# Patient Record
Sex: Female | Born: 1935 | Race: White | Hispanic: No | Smoking: Never smoker
Health system: Southern US, Community
[De-identification: ages and names within clinical notes are randomized; demographics above are authoritative.]

## PROBLEM LIST (undated history)

## (undated) DIAGNOSIS — E785 Hyperlipidemia, unspecified: Secondary | ICD-10-CM

## (undated) DIAGNOSIS — S22080A Wedge compression fracture of T11-T12 vertebra, initial encounter for closed fracture: Secondary | ICD-10-CM

## (undated) DIAGNOSIS — Z9181 History of falling: Secondary | ICD-10-CM

## (undated) DIAGNOSIS — F0390 Unspecified dementia without behavioral disturbance: Secondary | ICD-10-CM

## (undated) DIAGNOSIS — D72820 Lymphocytosis (symptomatic): Secondary | ICD-10-CM

## (undated) DIAGNOSIS — R350 Frequency of micturition: Secondary | ICD-10-CM

## (undated) DIAGNOSIS — L989 Disorder of the skin and subcutaneous tissue, unspecified: Secondary | ICD-10-CM

## (undated) DIAGNOSIS — R413 Other amnesia: Secondary | ICD-10-CM

## (undated) DIAGNOSIS — R430 Anosmia: Principal | ICD-10-CM

## (undated) DIAGNOSIS — G252 Other specified forms of tremor: Secondary | ICD-10-CM

## (undated) DIAGNOSIS — G250 Essential tremor: Secondary | ICD-10-CM

## (undated) HISTORY — DX: Unspecified dementia, unspecified severity, without behavioral disturbance, psychotic disturbance, mood disturbance, and anxiety: F03.90

## (undated) HISTORY — DX: Other amnesia: R41.3

## (undated) HISTORY — DX: Hyperlipidemia, unspecified: E78.5

## (undated) HISTORY — DX: History of falling: Z91.81

## (undated) HISTORY — DX: Disorder of the skin and subcutaneous tissue, unspecified: L98.9

## (undated) HISTORY — DX: Lymphocytosis (symptomatic): D72.820

## (undated) HISTORY — DX: Essential tremor: G25.0

## (undated) HISTORY — DX: Wedge compression fracture of t11-T12 vertebra, initial encounter for closed fracture: S22.080A

## (undated) HISTORY — DX: Anosmia: R43.0

## (undated) HISTORY — PX: TONSILLECTOMY: SHX5217

## (undated) HISTORY — DX: Essential tremor: G25.2

## (undated) HISTORY — DX: Frequency of micturition: R35.0

---

## 1964-01-08 HISTORY — PX: ABDOMINAL HYSTERECTOMY: SHX81

## 2008-11-11 ENCOUNTER — Emergency Department (HOSPITAL_BASED_OUTPATIENT_CLINIC_OR_DEPARTMENT_OTHER): Admission: EM | Admit: 2008-11-11 | Discharge: 2008-11-11 | Payer: Self-pay | Admitting: Emergency Medicine

## 2008-11-11 ENCOUNTER — Ambulatory Visit: Payer: Self-pay | Admitting: Diagnostic Radiology

## 2008-11-12 ENCOUNTER — Inpatient Hospital Stay (HOSPITAL_COMMUNITY): Admission: EM | Admit: 2008-11-12 | Discharge: 2008-11-14 | Payer: Self-pay | Admitting: Orthopedic Surgery

## 2008-11-27 ENCOUNTER — Emergency Department (HOSPITAL_BASED_OUTPATIENT_CLINIC_OR_DEPARTMENT_OTHER): Admission: EM | Admit: 2008-11-27 | Discharge: 2008-11-27 | Payer: Self-pay | Admitting: Emergency Medicine

## 2008-11-27 ENCOUNTER — Ambulatory Visit: Payer: Self-pay | Admitting: Diagnostic Radiology

## 2009-01-10 ENCOUNTER — Encounter: Admission: RE | Admit: 2009-01-10 | Discharge: 2009-03-23 | Payer: Self-pay | Admitting: Orthopedic Surgery

## 2010-04-11 LAB — COMPREHENSIVE METABOLIC PANEL
Albumin: 3.1 g/dL — ABNORMAL LOW (ref 3.5–5.2)
Albumin: 3.1 g/dL — ABNORMAL LOW (ref 3.5–5.2)
Alkaline Phosphatase: 72 U/L (ref 39–117)
BUN: 15 mg/dL (ref 6–23)
BUN: 17 mg/dL (ref 6–23)
BUN: 22 mg/dL (ref 6–23)
CO2: 27 mEq/L (ref 19–32)
CO2: 27 mEq/L (ref 19–32)
Calcium: 9.5 mg/dL (ref 8.4–10.5)
Calcium: 9.5 mg/dL (ref 8.4–10.5)
Chloride: 105 mEq/L (ref 96–112)
Creatinine, Ser: 0.95 mg/dL (ref 0.4–1.2)
GFR calc non Af Amer: 56 mL/min — ABNORMAL LOW (ref >60)
GFR calc non Af Amer: 57 mL/min — ABNORMAL LOW (ref >60)
Glucose, Bld: 117 mg/dL — ABNORMAL HIGH (ref 70–99)
Glucose, Bld: 89 mg/dL (ref 70–99)
Glucose, Bld: 99 mg/dL (ref 70–99)
Potassium: 3.8 mEq/L (ref 3.5–5.1)
Potassium: 3.8 mEq/L (ref 3.5–5.1)
Sodium: 139 mEq/L (ref 135–145)
Sodium: 140 mEq/L (ref 135–145)
Total Bilirubin: 0.6 mg/dL (ref 0.3–1.2)
Total Protein: 5.4 g/dL — ABNORMAL LOW (ref 6.0–8.3)
Total Protein: 5.5 g/dL — ABNORMAL LOW (ref 6.0–8.3)

## 2010-04-11 LAB — CBC
HCT: 35.5 % — ABNORMAL LOW (ref 36.0–46.0)
HCT: 35.5 % — ABNORMAL LOW (ref 36.0–46.0)
HCT: 35.7 % — ABNORMAL LOW (ref 36.0–46.0)
Hemoglobin: 11.9 g/dL — ABNORMAL LOW (ref 12.0–15.0)
Hemoglobin: 12 g/dL (ref 12.0–15.0)
Hemoglobin: 12.2 g/dL (ref 12.0–15.0)
MCHC: 33.5 g/dL (ref 30.0–36.0)
MCHC: 33.9 g/dL (ref 30.0–36.0)
MCV: 93.5 fL (ref 78.0–100.0)
MCV: 94 fL (ref 78.0–100.0)
Platelets: 152 10*3/uL (ref 150–400)
Platelets: 157 10*3/uL (ref 150–400)
RBC: 3.8 MIL/uL — ABNORMAL LOW (ref 3.87–5.11)
RDW: 12.2 % (ref 11.5–15.5)
RDW: 12.9 % (ref 11.5–15.5)
WBC: 11.4 10*3/uL — ABNORMAL HIGH (ref 4.0–10.5)

## 2010-04-11 LAB — DIFFERENTIAL
Basophils Absolute: 0.1 10*3/uL (ref 0.0–0.1)
Basophils Relative: 1 % (ref 0–1)
Lymphocytes Relative: 30 % (ref 12–46)
Lymphs Abs: 4.2 10*3/uL — ABNORMAL HIGH (ref 0.7–4.0)
Lymphs Abs: 4.9 10*3/uL — ABNORMAL HIGH (ref 0.7–4.0)
Monocytes Absolute: 0.8 10*3/uL (ref 0.1–1.0)
Monocytes Absolute: 0.8 10*3/uL (ref 0.1–1.0)
Monocytes Relative: 7 % (ref 3–12)
Monocytes Relative: 9 % (ref 3–12)
Neutro Abs: 5.2 10*3/uL (ref 1.7–7.7)
Neutro Abs: 5.7 10*3/uL (ref 1.7–7.7)
Neutro Abs: 8.5 10*3/uL — ABNORMAL HIGH (ref 1.7–7.7)
Neutrophils Relative %: 47 % (ref 43–77)
Neutrophils Relative %: 60 % (ref 43–77)

## 2010-04-11 LAB — URINALYSIS, ROUTINE W REFLEX MICROSCOPIC
Glucose, UA: NEGATIVE mg/dL
Hgb urine dipstick: NEGATIVE
Protein, ur: NEGATIVE mg/dL
Specific Gravity, Urine: 1.019 (ref 1.005–1.030)

## 2010-04-11 LAB — PROTIME-INR
INR: 0.96 (ref 0.00–1.49)
INR: 1.02 (ref 0.00–1.49)
Prothrombin Time: 12.7 seconds (ref 11.6–15.2)
Prothrombin Time: 13.6 seconds (ref 11.6–15.2)

## 2010-04-11 LAB — URINE MICROSCOPIC-ADD ON

## 2011-01-08 HISTORY — PX: CATARACT EXTRACTION: SUR2

## 2011-01-08 HISTORY — PX: OTHER SURGICAL HISTORY: SHX169

## 2011-01-28 DIAGNOSIS — H43399 Other vitreous opacities, unspecified eye: Secondary | ICD-10-CM | POA: Diagnosis not present

## 2011-01-28 DIAGNOSIS — H353190 Nonexudative age-related macular degeneration, unspecified eye, stage unspecified: Secondary | ICD-10-CM | POA: Diagnosis not present

## 2011-01-28 DIAGNOSIS — H35729 Serous detachment of retinal pigment epithelium, unspecified eye: Secondary | ICD-10-CM | POA: Diagnosis not present

## 2011-01-28 DIAGNOSIS — H04129 Dry eye syndrome of unspecified lacrimal gland: Secondary | ICD-10-CM | POA: Diagnosis not present

## 2011-06-06 DIAGNOSIS — C44319 Basal cell carcinoma of skin of other parts of face: Secondary | ICD-10-CM | POA: Diagnosis not present

## 2011-06-06 DIAGNOSIS — D485 Neoplasm of uncertain behavior of skin: Secondary | ICD-10-CM | POA: Diagnosis not present

## 2011-08-06 DIAGNOSIS — C44319 Basal cell carcinoma of skin of other parts of face: Secondary | ICD-10-CM | POA: Diagnosis not present

## 2011-10-03 DIAGNOSIS — Z23 Encounter for immunization: Secondary | ICD-10-CM | POA: Diagnosis not present

## 2011-10-21 DIAGNOSIS — E785 Hyperlipidemia, unspecified: Secondary | ICD-10-CM | POA: Diagnosis not present

## 2011-10-21 DIAGNOSIS — R42 Dizziness and giddiness: Secondary | ICD-10-CM | POA: Diagnosis not present

## 2012-02-14 DIAGNOSIS — Z961 Presence of intraocular lens: Secondary | ICD-10-CM | POA: Diagnosis not present

## 2012-02-14 DIAGNOSIS — H353190 Nonexudative age-related macular degeneration, unspecified eye, stage unspecified: Secondary | ICD-10-CM | POA: Diagnosis not present

## 2012-02-14 DIAGNOSIS — H43399 Other vitreous opacities, unspecified eye: Secondary | ICD-10-CM | POA: Diagnosis not present

## 2012-10-21 DIAGNOSIS — Z23 Encounter for immunization: Secondary | ICD-10-CM | POA: Diagnosis not present

## 2012-11-06 DIAGNOSIS — D485 Neoplasm of uncertain behavior of skin: Secondary | ICD-10-CM | POA: Diagnosis not present

## 2012-11-06 DIAGNOSIS — Z85828 Personal history of other malignant neoplasm of skin: Secondary | ICD-10-CM | POA: Diagnosis not present

## 2012-11-06 DIAGNOSIS — C44611 Basal cell carcinoma of skin of unspecified upper limb, including shoulder: Secondary | ICD-10-CM | POA: Diagnosis not present

## 2012-11-16 DIAGNOSIS — Z85828 Personal history of other malignant neoplasm of skin: Secondary | ICD-10-CM | POA: Diagnosis not present

## 2012-11-16 DIAGNOSIS — C44611 Basal cell carcinoma of skin of unspecified upper limb, including shoulder: Secondary | ICD-10-CM | POA: Diagnosis not present

## 2013-04-01 ENCOUNTER — Non-Acute Institutional Stay: Payer: Medicare Other | Admitting: Nurse Practitioner

## 2013-04-01 VITALS — BP 124/60 | HR 76 | Temp 98.6°F | Ht 63.0 in | Wt 143.0 lb

## 2013-04-01 DIAGNOSIS — R259 Unspecified abnormal involuntary movements: Secondary | ICD-10-CM | POA: Diagnosis not present

## 2013-04-01 DIAGNOSIS — H6123 Impacted cerumen, bilateral: Secondary | ICD-10-CM

## 2013-04-01 DIAGNOSIS — R251 Tremor, unspecified: Secondary | ICD-10-CM

## 2013-04-01 DIAGNOSIS — H6120 Impacted cerumen, unspecified ear: Secondary | ICD-10-CM

## 2013-04-01 DIAGNOSIS — R350 Frequency of micturition: Secondary | ICD-10-CM | POA: Diagnosis not present

## 2013-04-01 DIAGNOSIS — D1800 Hemangioma unspecified site: Secondary | ICD-10-CM

## 2013-04-01 DIAGNOSIS — R413 Other amnesia: Secondary | ICD-10-CM | POA: Diagnosis not present

## 2013-04-01 DIAGNOSIS — E785 Hyperlipidemia, unspecified: Secondary | ICD-10-CM

## 2013-04-03 DIAGNOSIS — R350 Frequency of micturition: Secondary | ICD-10-CM

## 2013-04-03 DIAGNOSIS — R413 Other amnesia: Secondary | ICD-10-CM

## 2013-04-03 DIAGNOSIS — H6123 Impacted cerumen, bilateral: Secondary | ICD-10-CM | POA: Insufficient documentation

## 2013-04-03 DIAGNOSIS — E785 Hyperlipidemia, unspecified: Secondary | ICD-10-CM | POA: Insufficient documentation

## 2013-04-03 DIAGNOSIS — R251 Tremor, unspecified: Secondary | ICD-10-CM | POA: Insufficient documentation

## 2013-04-03 DIAGNOSIS — D1800 Hemangioma unspecified site: Secondary | ICD-10-CM | POA: Insufficient documentation

## 2013-04-03 HISTORY — DX: Frequency of micturition: R35.0

## 2013-04-03 HISTORY — DX: Other amnesia: R41.3

## 2013-04-03 NOTE — Assessment & Plan Note (Signed)
OTC debrox

## 2013-04-03 NOTE — Assessment & Plan Note (Signed)
Resting tremor in hands R>L not disabling.

## 2013-04-03 NOTE — Assessment & Plan Note (Signed)
Venous lake like skin lesion posterior right flank. Observe.

## 2013-04-03 NOTE — Progress Notes (Signed)
Patient ID: Debra [name], female   DOB: [birth year], 80 y.o.   MRN: [id number]   Code Status: Full Code.   No Known Allergies  Chief Complaint  Patient presents with  . Medical Managment of Chronic Issues    New Patient switching primary .  With daughter [name]    HPI: Patient is a 80 y.o. female seen in the clinic at [facility] today for new patient establishment and other chronic medical conditions.  Problem List Items Addressed This Visit   Tremor - Primary     Resting tremor in hands R>L not disabling.     Urinary frequency     Nocturnal, 4-5x/night.     Memory deficit     Memory lapses, will obtain MMSE. Check CBC, CMP, TSH, B12    Impacted cerumen of both ears     OTC debrox     Angioma, venous     Venous lake like skin lesion posterior right flank. Observe.     Relevant Medications      atorvastatin (LIPITOR) 40 MG tablet   Other and unspecified hyperlipidemia     Taking Atorvastatin 40mg  daily, update lipid panel.        Review of Systems:  Review of Systems  Constitutional: Negative for fever, chills, weight loss, malaise/fatigue and diaphoresis.  HENT: Positive for hearing loss. Negative for congestion, ear discharge, ear pain, nosebleeds, sore throat and tinnitus.   Eyes: Negative for blurred vision, double vision, photophobia, pain, discharge and redness.  Respiratory: Negative for cough, hemoptysis, sputum production, shortness of breath, wheezing and stridor.   Cardiovascular: Negative for chest pain, palpitations, orthopnea, claudication, leg swelling and PND.  Gastrointestinal: Negative for heartburn, nausea, vomiting, abdominal pain, diarrhea, constipation, blood in stool and melena.  Genitourinary: Positive for frequency. Negative for dysuria, urgency, hematuria and flank pain.       4-5x/night  Musculoskeletal: Negative for back pain, falls, joint pain, myalgias and neck pain.  Skin: Negative for itching and rash.       A small venous lake  angioma like skin lesion @ the posterior flank.   Neurological: Positive for tremors. Negative for dizziness, tingling, sensory change, speech change, focal weakness, seizures, loss of consciousness, weakness and headaches.       Resting tremor in hands R>L  Endo/Heme/Allergies: Negative for environmental allergies and polydipsia. Does not bruise/bleed easily.  Psychiatric/Behavioral: Negative for depression, suicidal ideas, hallucinations, memory loss and substance abuse. The patient is not nervous/anxious and does not have insomnia.      Past Medical History  Diagnosis Date  . Other and unspecified hyperlipidemia   . Essential and other specified forms of tremor   . Senile dementia, uncomplicated    Past Surgical History  Procedure Laterality Date  . Abdominal hysterectomy  1966  . Tonsillectomy     Social History:   reports that she has never smoked. She has never used smokeless tobacco. She reports that she does not drink alcohol or use illicit drugs.  History reviewed. No pertinent family history.  Medications: Patient's Medications  New Prescriptions   No medications on file  Previous Medications   ACETAMINOPHEN (TYLENOL) 325 MG TABLET    Take 650 mg by mouth. Take one as needed for headache or pain   ATORVASTATIN (LIPITOR) 40 MG TABLET    Take 40 mg by mouth daily.  Modified Medications   No medications on file  Discontinued Medications   No medications on file     Physical Exam:  Physical Exam  Constitutional: She is oriented to person, place, and time. She appears well-developed and well-nourished. No distress.  HENT:  Head: Normocephalic and atraumatic.  Right Ear: External ear normal.  Left Ear: External ear normal.  Nose: Nose normal.  Mouth/Throat: Oropharynx is clear and moist. No oropharyngeal exudate.  Eyes: Conjunctivae and EOM are normal. Pupils are equal, round, and reactive to light. Right eye exhibits no discharge. Left eye exhibits no discharge. No  scleral icterus.  Neck: Normal range of motion. Neck supple. No JVD present. No tracheal deviation present. No thyromegaly present.  Cardiovascular: Normal rate, regular rhythm, normal heart sounds and intact distal pulses.  Exam reveals no gallop.   No murmur heard. Pulmonary/Chest: Effort normal and breath sounds normal. No stridor. No respiratory distress. She has no wheezes. She has no rales.  Abdominal: Soft. Bowel sounds are normal. She exhibits no distension. There is no tenderness. There is no rebound and no guarding.  Genitourinary: Guaiac negative stool.  S/p hysterectomy.   Musculoskeletal: Normal range of motion. She exhibits no edema and no tenderness.  Lymphadenopathy:    She has no cervical adenopathy.  Neurological: She is alert and oriented to person, place, and time. She has normal reflexes. She displays normal reflexes. No cranial nerve deficit. Coordination normal.  Skin: No rash noted. She is not diaphoretic. No erythema. No pallor.  A small venous lake angioma like skin lesion @ the posterior flank.    Psychiatric: She has a normal mood and affect. Her behavior is normal. Judgment and thought content normal. Her mood appears not anxious. Cognition and memory are impaired. She does not exhibit a depressed mood. She exhibits abnormal recent memory.  Memory lapses     Filed Vitals:   04/01/13 1604  BP: 124/60  Pulse: 76  Temp: 98.6 F (37 C)  TempSrc: Oral  Height: 5\' 3"  (1.6 m)  Weight: 143 lb (64.864 kg)      Labs reviewed: Basic Metabolic Panel: No results found for this basename: NA, K, CL, CO2, GLUCOSE, BUN, CREATININE, CALCIUM, MG, PHOS, TSH,  in the last 8760 hours Liver Function Tests: No results found for this basename: AST, ALT, ALKPHOS, BILITOT, PROT, ALBUMIN,  in the last 8760 hours No results found for this basename: LIPASE, AMYLASE,  in the last 8760 hours No results found for this basename: AMMONIA,  in the last 8760 hours CBC: No results  found for this basename: WBC, NEUTROABS, HGB, HCT, MCV, PLT,  in the last 8760 hours Lipid Panel: No results found for this basename: CHOL, HDL, LDLCALC, TRIG, CHOLHDL, LDLDIRECT,  in the last 8760 hours Anemia Panel: No results found for this basename: FOLATE, IRON, VITAMINB12,  in the last 8760 hours  Past Procedures:  NA  Assessment/Plan Tremor Resting tremor in hands R>L not disabling.   Urinary frequency Nocturnal, 4-5x/night.   Memory deficit Memory lapses, will obtain MMSE. Check CBC, CMP, TSH, B12  Impacted cerumen of both ears OTC debrox   Angioma, venous Venous lake like skin lesion posterior right flank. Observe.   Other and unspecified hyperlipidemia Taking Atorvastatin 40mg  daily, update lipid panel.     Family/ Staff Communication: observe the patient.   Goals of Care: IL  Labs/tests ordered: CBC, CMP, TSH, Lipid panel, Vit B12.

## 2013-04-03 NOTE — Assessment & Plan Note (Signed)
Taking Atorvastatin 40mg  daily, update lipid panel.

## 2013-04-03 NOTE — Assessment & Plan Note (Signed)
Nocturnal, 4-5x/night.

## 2013-04-03 NOTE — Assessment & Plan Note (Addendum)
Memory lapses, will obtain MMSE. Check CBC, CMP, TSH, B12

## 2013-04-12 DIAGNOSIS — H04129 Dry eye syndrome of unspecified lacrimal gland: Secondary | ICD-10-CM | POA: Diagnosis not present

## 2013-04-12 DIAGNOSIS — H43399 Other vitreous opacities, unspecified eye: Secondary | ICD-10-CM | POA: Diagnosis not present

## 2013-04-12 DIAGNOSIS — H524 Presbyopia: Secondary | ICD-10-CM | POA: Diagnosis not present

## 2013-04-12 DIAGNOSIS — H353190 Nonexudative age-related macular degeneration, unspecified eye, stage unspecified: Secondary | ICD-10-CM | POA: Diagnosis not present

## 2013-07-29 ENCOUNTER — Encounter: Payer: Self-pay | Admitting: Internal Medicine

## 2013-07-29 ENCOUNTER — Non-Acute Institutional Stay: Payer: Medicare Other | Admitting: Internal Medicine

## 2013-07-29 VITALS — BP 142/62 | HR 72 | Wt 142.0 lb

## 2013-07-29 DIAGNOSIS — R259 Unspecified abnormal involuntary movements: Secondary | ICD-10-CM

## 2013-07-29 DIAGNOSIS — R229 Localized swelling, mass and lump, unspecified: Secondary | ICD-10-CM | POA: Diagnosis not present

## 2013-07-29 DIAGNOSIS — H6120 Impacted cerumen, unspecified ear: Secondary | ICD-10-CM

## 2013-07-29 DIAGNOSIS — R439 Unspecified disturbances of smell and taste: Secondary | ICD-10-CM | POA: Diagnosis not present

## 2013-07-29 DIAGNOSIS — R251 Tremor, unspecified: Secondary | ICD-10-CM

## 2013-07-29 DIAGNOSIS — R413 Other amnesia: Secondary | ICD-10-CM | POA: Diagnosis not present

## 2013-07-29 DIAGNOSIS — R430 Anosmia: Secondary | ICD-10-CM

## 2013-07-29 DIAGNOSIS — L989 Disorder of the skin and subcutaneous tissue, unspecified: Secondary | ICD-10-CM | POA: Insufficient documentation

## 2013-07-29 DIAGNOSIS — H6123 Impacted cerumen, bilateral: Secondary | ICD-10-CM

## 2013-07-29 HISTORY — DX: Disorder of the skin and subcutaneous tissue, unspecified: L98.9

## 2013-07-29 HISTORY — DX: Anosmia: R43.0

## 2013-07-29 NOTE — Progress Notes (Signed)
Patient ID: Debra [name], female   DOB: 10-14-1921, 80 y.o.   MRN: [id number]    Location:  [place] [facility] Service: Clinic (12)  Extended Emergency Contact Information Primary Emergency Contact: Oakley,[name] Address: [address]          [place] [address] [address] [place] Phone: [phone] Mobile Phone: [phone] Relation: Daughter   Chief Complaint  Patient presents with  . Medical Management of Chronic Issues    memory, tremor, urinary frequency. New patient follow-up, saw [facility] 04/01/13. Here with daughter Debra [name]    HPI:   Nodular lesion on surface of skin: right flank  Impacted cerumen of both ears: needs to be cleansed  Memory deficit: present 1 year or more. She is not particularly bothered by it.  Tremor: present > 5 years. Right side greater than left. 6 cps.     Past Medical History  Diagnosis Date  . Other and unspecified hyperlipidemia   . Essential and other specified forms of tremor   . Senile dementia, uncomplicated   . 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 Memory deficit 04/03/2013  . Nodular lesion on surface of skin 07/29/2013    Right flank. Black. About 12m diameter. Nodular.   . Urinary frequency 04/03/2013    Nocturnal, 4-5x/night.       Past Surgical History  Procedure Laterality Date  . Abdominal hysterectomy  1966  . Tonsillectomy    . Cataract extraction Right 2013    Dr. HHerbert Deaner . Excise skin cancer left upper arm Left 2013    Dr. KJari Pigg   History   Social History  . Marital Status: Married    Spouse Name: N/A    Number of Children: N/A  . Years of Education: N/A   Occupational History  . Not on file.   Social History Main Topics  . Smoking status: Never Smoker   . Smokeless tobacco: Never Used  . Alcohol Use: No  . Drug Use: No  . Sexual Activity: Not on file   Other Topics Concern  . Not on file   Social History Narrative   [facility]   Married   Living Will   Exercise: walks daily , walks with cane   Does not drink any alcohol    Never smoked   Caffeine 2 drinks daily      reports that she has never smoked. She has never used smokeless tobacco. She reports that she does not drink alcohol or use illicit drugs.  Immunization History  Administered Date(s) Administered  . DTaP 10/18/2010  . Influenza-Unspecified 08/25/2012  . Pneumococcal Polysaccharide-23 10/08/2000    No Known Allergies  Medications: Patient's Medications  New Prescriptions   No medications on file  Previous Medications   ACETAMINOPHEN (TYLENOL) 325 MG TABLET    Take 650 mg by mouth. Take one as needed for headache or pain   ATORVASTATIN (LIPITOR) 40 MG TABLET    Take 40 mg by mouth daily.  Modified Medications   No medications on file  Discontinued Medications   No medications on file     Review of Systems  Constitutional: Negative for fever, diaphoresis, activity change, fatigue and unexpected weight change.  HENT: Positive for hearing loss. Negative for ear pain, sore throat, trouble swallowing and voice change.   Eyes: Negative.   Respiratory: Negative for cough, choking, shortness of breath and wheezing.   Cardiovascular: Negative for chest pain, palpitations and leg swelling.  Gastrointestinal: Negative for nausea, abdominal pain, diarrhea and abdominal distention.  Endocrine: Negative.   Genitourinary: Negative for dysuria, urgency, frequency, flank pain and vaginal pain.  Musculoskeletal: Positive for gait problem. Negative for arthralgias, back pain, joint swelling, myalgias and neck pain.  Skin:       Recurrent skin cancer left upper arm. Black nodular lesion right flank.  Allergic/Immunologic: Negative.   Neurological: Positive for tremors and numbness (at toes). Negative for dizziness, seizures and weakness.  Hematological: Negative.   Psychiatric/Behavioral: Negative.     There were no vitals filed for this visit. There is no  weight on file to calculate BMI.  Physical Exam  Constitutional: She is oriented to person, place, and time. She appears well-developed and well-nourished. No distress.  HENT:  Head: Normocephalic and atraumatic.  Right Ear: External ear normal.  Left Ear: External ear normal.  Nose: Nose normal.  Mouth/Throat: Oropharynx is clear and moist. No oropharyngeal exudate.  Bilateral cerumen impaction removed with lavage and curette. Decreased hearing.  Eyes: Conjunctivae and EOM are normal. Pupils are equal, round, and reactive to light. Right eye exhibits no discharge. Left eye exhibits no discharge. No scleral icterus.  Neck: Normal range of motion. Neck supple. No JVD present. No tracheal deviation present. No thyromegaly present.  Cardiovascular: Normal rate, regular rhythm, normal heart sounds and intact distal pulses.  Exam reveals no gallop.   No murmur heard. Pulmonary/Chest: Effort normal and breath sounds normal. No stridor. No respiratory distress. She has no wheezes. She has no rales.  Abdominal: Soft. Bowel sounds are normal. She exhibits no distension and no mass. There is no tenderness. There is no rebound and no guarding.  Genitourinary: Guaiac negative stool.  S/p hysterectomy.   Musculoskeletal: Normal range of motion. She exhibits no edema and no tenderness.  Unstable gait. Using cane.  Lymphadenopathy:    She has no cervical adenopathy.  Neurological: She is alert and oriented to person, place, and time. She has normal reflexes. No cranial nerve deficit. Coordination normal.  Able to feel vibration at ankles, but not on toes.  Skin: No rash noted. She is not diaphoretic. No erythema. No pallor.  55m black nodule right flank. Non-tender.   Psychiatric: She has a normal mood and affect. Her behavior is normal. Judgment and thought content normal. Her mood appears not anxious. Cognition and memory are impaired. She does not exhibit a depressed mood. She exhibits abnormal  recent memory.  Memory lapses      Labs reviewed: No visits with results within 3 Month(s) from this visit. Latest known visit with results is:  Admission on 11/12/2008, Discharged on 11/14/2008  Component Date Value Ref Range Status  . WBC 11/12/2008 14.1* 4.0 - 10.5 K/uL Final  . RBC 11/12/2008 3.79* 3.87 - 5.11 MIL/uL Final  . Hemoglobin 11/12/2008 12.0  12.0 - 15.0 g/dL Final  . HCT 11/12/2008 35.5* 36.0 - 46.0 % Final  . MCV 11/12/2008 93.5  78.0 - 100.0 fL Final  . MCHC 11/12/2008 33.9  30.0 - 36.0 g/dL Final  . RDW 11/12/2008 12.2  11.5 - 15.5 % Final  . Platelets 11/12/2008 151  150 - 400 K/uL Final  . Sodium 11/12/2008 139  135 - 145 mEq/L Final  . Potassium 11/12/2008 3.8  3.5 - 5.1 mEq/L Final  . Chloride 11/12/2008 106  96 - 112 mEq/L Final  . CO2 11/12/2008 27  19 - 32 mEq/L Final  . Glucose, Bld 11/12/2008 117* 70 - 99 mg/dL Final  .  BUN 11/12/2008 22  6 - 23 mg/dL Final  . Creatinine, Ser 11/12/2008 0.95  0.4 - 1.2 mg/dL Final  . Calcium 11/12/2008 9.5  8.4 - 10.5 mg/dL Final  . Total Protein 11/12/2008 5.4* 6.0 - 8.3 g/dL Final  . Albumin 11/12/2008 3.3* 3.5 - 5.2 g/dL Final  . AST 11/12/2008 27  0 - 37 U/L Final  . ALT 11/12/2008 24  0 - 35 U/L Final  . Alkaline Phosphatase 11/12/2008 60  39 - 117 U/L Final  . Total Bilirubin 11/12/2008 0.6  0.3 - 1.2 mg/dL Final  . GFR calc non Af Amer 11/12/2008 56* >60 mL/min Final  . GFR calc Af Amer 11/12/2008   >60 mL/min Final                   Value:>60                                The eGFR has been calculated                         using the MDRD equation.                         This calculation has not been                         validated in all clinical                         situations.                         eGFR's persistently                         <60 mL/min signify                         possible Chronic Kidney Disease.  Marland Kitchen Neutrophils Relative % 11/12/2008 60  43 - 77 % Final  . Neutro Abs  11/12/2008 8.5* 1.7 - 7.7 K/uL Final  . Lymphocytes Relative 11/12/2008 30  12 - 46 % Final  . Lymphs Abs 11/12/2008 4.2* 0.7 - 4.0 K/uL Final  . Monocytes Relative 11/12/2008 9  3 - 12 % Final  . Monocytes Absolute 11/12/2008 1.2* 0.1 - 1.0 K/uL Final  . Eosinophils Relative 11/12/2008 1  0 - 5 % Final  . Eosinophils Absolute 11/12/2008 0.1  0.0 - 0.7 K/uL Final  . Basophils Relative 11/12/2008 0  0 - 1 % Final  . Basophils Absolute 11/12/2008 0.1  0.0 - 0.1 K/uL Final  . Color, Urine 11/12/2008 YELLOW  YELLOW Final  . APPearance 11/12/2008 CLEAR  CLEAR Final  . Specific Gravity, Urine 11/12/2008 1.019  1.005 - 1.030 Final  . pH 11/12/2008 5.0  5.0 - 8.0 Final  . Glucose, UA 11/12/2008 NEGATIVE  NEGATIVE mg/dL Final  . Hgb urine dipstick 11/12/2008 NEGATIVE  NEGATIVE Final  . Bilirubin Urine 11/12/2008 NEGATIVE  NEGATIVE Final  . Ketones, ur 11/12/2008 NEGATIVE  NEGATIVE mg/dL Final  . Protein, ur 11/12/2008 NEGATIVE  NEGATIVE mg/dL Final  . Urobilinogen, UA 11/12/2008 0.2  0.0 - 1.0 mg/dL Final  . Nitrite 11/12/2008 NEGATIVE  NEGATIVE Final  . Leukocytes, UA  11/12/2008 MODERATE* NEGATIVE Final  . Squamous Epithelial / LPF 11/12/2008 RARE  RARE Final  . WBC, UA 11/12/2008 11-20  <3 WBC/hpf Final  . Bacteria, UA 11/12/2008 RARE  RARE Final  . Prothrombin Time 11/12/2008 12.7  11.6 - 15.2 seconds Final  . INR 11/12/2008 0.96  0.00 - 1.49 Final  . WBC 11/13/2008 11.1* 4.0 - 10.5 K/uL Final  . RBC 11/13/2008 3.74* 3.87 - 5.11 MIL/uL Final  . Hemoglobin 11/13/2008 11.9* 12.0 - 15.0 g/dL Final  . HCT 11/13/2008 35.5* 36.0 - 46.0 % Final  . MCV 11/13/2008 94.8  78.0 - 100.0 fL Final  . MCHC 11/13/2008 33.5  30.0 - 36.0 g/dL Final  . RDW 11/13/2008 12.9  11.5 - 15.5 % Final  . Platelets 11/13/2008 152  150 - 400 K/uL Final  . Sodium 11/13/2008 140  135 - 145 mEq/L Final  . Potassium 11/13/2008 3.8  3.5 - 5.1 mEq/L Final  . Chloride 11/13/2008 108  96 - 112 mEq/L Final  . CO2 11/13/2008  26  19 - 32 mEq/L Final  . Glucose, Bld 11/13/2008 89  70 - 99 mg/dL Final  . BUN 11/13/2008 17  6 - 23 mg/dL Final  . Creatinine, Ser 11/13/2008 0.81  0.4 - 1.2 mg/dL Final  . Calcium 11/13/2008 9.5  8.4 - 10.5 mg/dL Final  . Total Protein 11/13/2008 5.5* 6.0 - 8.3 g/dL Final  . Albumin 11/13/2008 3.1* 3.5 - 5.2 g/dL Final  . AST 11/13/2008 24  0 - 37 U/L Final  . ALT 11/13/2008 20  0 - 35 U/L Final  . Alkaline Phosphatase 11/13/2008 68  39 - 117 U/L Final  . Total Bilirubin 11/13/2008 0.9  0.3 - 1.2 mg/dL Final  . GFR calc non Af Amer 11/13/2008 >60  >60 mL/min Final  . GFR calc Af Amer 11/13/2008   >60 mL/min Final                   Value:>60                                The eGFR has been calculated                         using the MDRD equation.                         This calculation has not been                         validated in all clinical                         situations.                         eGFR's persistently                         <60 mL/min signify                         possible Chronic Kidney Disease.  Marland Kitchen Neutrophils Relative % 11/13/2008 47  43 - 77 % Final  . Neutro Abs 11/13/2008 5.2  1.7 - 7.7 K/uL Final  . Lymphocytes Relative 11/13/2008 45  12 - 46 % Final  . Lymphs Abs 11/13/2008 4.9* 0.7 - 4.0 K/uL Final  . Monocytes Relative 11/13/2008 7  3 - 12 % Final  . Monocytes Absolute 11/13/2008 0.8  0.1 - 1.0 K/uL Final  . Eosinophils Relative 11/13/2008 1  0 - 5 % Final  . Eosinophils Absolute 11/13/2008 0.1  0.0 - 0.7 K/uL Final  . Basophils Relative 11/13/2008 0  0 - 1 % Final  . Basophils Absolute 11/13/2008 0.0  0.0 - 0.1 K/uL Final  . Prothrombin Time 11/13/2008 13.4  11.6 - 15.2 seconds Final  . INR 11/13/2008 1.02  0.00 - 1.49 Final  . WBC 11/14/2008 11.4* 4.0 - 10.5 K/uL Final  . RBC 11/14/2008 3.80* 3.87 - 5.11 MIL/uL Final  . Hemoglobin 11/14/2008 12.2  12.0 - 15.0 g/dL Final  . HCT 11/14/2008 35.7* 36.0 - 46.0 % Final  . MCV 11/14/2008  94.0  78.0 - 100.0 fL Final  . MCHC 11/14/2008 34.0  30.0 - 36.0 g/dL Final  . RDW 11/14/2008 13.1  11.5 - 15.5 % Final  . Platelets 11/14/2008 157  150 - 400 K/uL Final  . Sodium 11/14/2008 137  135 - 145 mEq/L Final  . Potassium 11/14/2008 3.8  3.5 - 5.1 mEq/L Final  . Chloride 11/14/2008 105  96 - 112 mEq/L Final  . CO2 11/14/2008 27  19 - 32 mEq/L Final  . Glucose, Bld 11/14/2008 99  70 - 99 mg/dL Final  . BUN 11/14/2008 15  6 - 23 mg/dL Final  . Creatinine, Ser 11/14/2008 0.94  0.4 - 1.2 mg/dL Final  . Calcium 11/14/2008 9.4  8.4 - 10.5 mg/dL Final  . Total Protein 11/14/2008 5.7* 6.0 - 8.3 g/dL Final  . Albumin 11/14/2008 3.1* 3.5 - 5.2 g/dL Final  . AST 11/14/2008 30  0 - 37 U/L Final  . ALT 11/14/2008 21  0 - 35 U/L Final  . Alkaline Phosphatase 11/14/2008 72  39 - 117 U/L Final  . Total Bilirubin 11/14/2008 0.6  0.3 - 1.2 mg/dL Final  . GFR calc non Af Amer 11/14/2008 57* >60 mL/min Final  . GFR calc Af Amer 11/14/2008   >60 mL/min Final                   Value:>60                                The eGFR has been calculated                         using the MDRD equation.                         This calculation has not been                         validated in all clinical                         situations.                         eGFR's persistently                         <60 mL/min signify  possible Chronic Kidney Disease.  Marland Kitchen Neutrophils Relative % 11/14/2008 49  43 - 77 % Final  . Neutro Abs 11/14/2008 5.7  1.7 - 7.7 K/uL Final  . Lymphocytes Relative 11/14/2008 42  12 - 46 % Final  . Lymphs Abs 11/14/2008 4.8* 0.7 - 4.0 K/uL Final  . Monocytes Relative 11/14/2008 7  3 - 12 % Final  . Monocytes Absolute 11/14/2008 0.8  0.1 - 1.0 K/uL Final  . Eosinophils Relative 11/14/2008 1  0 - 5 % Final  . Eosinophils Absolute 11/14/2008 0.1  0.0 - 0.7 K/uL Final  . Basophils Relative 11/14/2008 1  0 - 1 % Final  . Basophils Absolute 11/14/2008 0.1  0.0  - 0.1 K/uL Final  . Prothrombin Time 11/14/2008 13.6  11.6 - 15.2 seconds Final  . INR 11/14/2008 1.05  0.00 - 1.49 Final      Assessment/Plan  1. Anosmia Chronic and unchanged  2. Nodular lesion on surface of skin--worrisome for melanoma right flank. Recurrent skin cancer left upper arm. - Ambulatory referral to Dermatology  3. Impacted cerumen of both ears lavaged  4. Memory deficit Chronic. Etiology undetermined.  5. Tremor Chronic. Suggestive of Parkinsonism.

## 2013-08-19 DIAGNOSIS — D485 Neoplasm of uncertain behavior of skin: Secondary | ICD-10-CM | POA: Diagnosis not present

## 2013-08-19 DIAGNOSIS — C44611 Basal cell carcinoma of skin of unspecified upper limb, including shoulder: Secondary | ICD-10-CM | POA: Diagnosis not present

## 2013-08-19 DIAGNOSIS — D1801 Hemangioma of skin and subcutaneous tissue: Secondary | ICD-10-CM | POA: Diagnosis not present

## 2013-09-23 DIAGNOSIS — E785 Hyperlipidemia, unspecified: Secondary | ICD-10-CM | POA: Diagnosis not present

## 2013-09-23 DIAGNOSIS — F0390 Unspecified dementia without behavioral disturbance: Secondary | ICD-10-CM | POA: Diagnosis not present

## 2013-09-23 DIAGNOSIS — G250 Essential tremor: Secondary | ICD-10-CM | POA: Diagnosis not present

## 2013-09-23 LAB — HEPATIC FUNCTION PANEL
ALK PHOS: 71 U/L (ref 25–125)
ALT: 15 U/L (ref 7–35)
AST: 17 U/L (ref 13–35)
Bilirubin, Total: 0.6 mg/dL

## 2013-09-23 LAB — CBC AND DIFFERENTIAL
HEMATOCRIT: 42 % (ref 36–46)
Hemoglobin: 14.1 g/dL (ref 12.0–16.0)
PLATELETS: 270 10*3/uL (ref 150–399)
WBC: 12.3 10*3/mL

## 2013-09-23 LAB — LIPID PANEL
CHOLESTEROL: 285 mg/dL — AB (ref 0–200)
HDL: 48 mg/dL (ref 35–70)
LDL CALC: 199 mg/dL
Triglycerides: 188 mg/dL — AB (ref 40–160)

## 2013-09-23 LAB — BASIC METABOLIC PANEL
BUN: 14 mg/dL (ref 4–21)
CREATININE: 0.9 mg/dL (ref 0.5–1.1)
Glucose: 87 mg/dL
Potassium: 4.3 mmol/L (ref 3.4–5.3)
Sodium: 139 mmol/L (ref 137–147)

## 2013-09-23 LAB — TSH: TSH: 2.91 u[IU]/mL (ref 0.41–5.90)

## 2013-09-24 DIAGNOSIS — C44611 Basal cell carcinoma of skin of unspecified upper limb, including shoulder: Secondary | ICD-10-CM | POA: Diagnosis not present

## 2013-09-30 ENCOUNTER — Encounter: Payer: Self-pay | Admitting: Nurse Practitioner

## 2013-10-25 DIAGNOSIS — Z23 Encounter for immunization: Secondary | ICD-10-CM | POA: Diagnosis not present

## 2013-10-28 ENCOUNTER — Encounter: Payer: Self-pay | Admitting: Nurse Practitioner

## 2013-11-25 ENCOUNTER — Non-Acute Institutional Stay: Payer: Medicare Other | Admitting: Nurse Practitioner

## 2013-11-25 ENCOUNTER — Encounter: Payer: Self-pay | Admitting: Nurse Practitioner

## 2013-11-25 VITALS — BP 118/72 | HR 80 | Wt 144.0 lb

## 2013-11-25 DIAGNOSIS — R430 Anosmia: Secondary | ICD-10-CM

## 2013-11-25 DIAGNOSIS — R413 Other amnesia: Secondary | ICD-10-CM

## 2013-11-25 DIAGNOSIS — R251 Tremor, unspecified: Secondary | ICD-10-CM

## 2013-11-25 DIAGNOSIS — R269 Unspecified abnormalities of gait and mobility: Secondary | ICD-10-CM | POA: Diagnosis not present

## 2013-11-25 DIAGNOSIS — R350 Frequency of micturition: Secondary | ICD-10-CM

## 2013-11-25 DIAGNOSIS — E785 Hyperlipidemia, unspecified: Secondary | ICD-10-CM

## 2013-11-25 NOTE — Assessment & Plan Note (Addendum)
Resting tremor in hands R>L not disabling.  11/25/13 recent fall x2, small steps-PT to evaluate and tx.

## 2013-11-25 NOTE — Assessment & Plan Note (Signed)
Fall x2 recently. PT to eval and tx as indicated.

## 2013-11-25 NOTE — Assessment & Plan Note (Addendum)
Nocturnal, 4-5x/night.  11/25/13 the patient stated 1x/day. dtr commented may be her memory is reliable.

## 2013-11-25 NOTE — Progress Notes (Signed)
Failed clock drawing  

## 2013-11-25 NOTE — Assessment & Plan Note (Addendum)
off Atorvastatin 40mg  daily-recommended.  09/23/13 LDL 199

## 2013-11-25 NOTE — Progress Notes (Signed)
Patient ID: Debra [name], female   DOB: [birth year], 80 y.o.   MRN: [id number]   Code Status: Full Code.   No Known Allergies  Chief Complaint  Patient presents with  . Medical Management of Chronic Issues    memory, cholesterol. Here with daughter Debra [name]    HPI: Patient is a 80 y.o. female seen in the clinic at [facility] today for evaluation of chronic medical conditions.  Problem List Items Addressed This Visit    Urinary frequency    Nocturnal, 4-5x/night.  11/25/13 the patient stated 1x/day. dtr commented may be her memory is reliable.     Tremor    Resting tremor in hands R>L not disabling.  11/25/13 recent fall x2, small steps-PT to evaluate and tx.      Memory deficit    Memory lapses 09/23/13 TSH 2.909, Vit B12 794 11/25/13 MMSE 21/30-Namenda will be considered upon POA's discussion with the rest of family.      Dyslipidemia    off Atorvastatin 40mg  daily-recommended.  09/23/13 LDL 199      Anosmia - Primary    No change.     Abnormality of gait    Fall x2 recently. PT to eval and tx as indicated.        Review of Systems:  Review of Systems  Constitutional: Negative for fever, chills, weight loss, malaise/fatigue and diaphoresis.  HENT: Positive for hearing loss. Negative for congestion, ear discharge, ear pain, nosebleeds, sore throat and tinnitus.   Eyes: Negative for blurred vision, double vision, photophobia, pain, discharge and redness.  Respiratory: Negative for cough, hemoptysis, sputum production, shortness of breath, wheezing and stridor.   Cardiovascular: Negative for chest pain, palpitations, orthopnea, claudication, leg swelling and PND.  Gastrointestinal: Negative for heartburn, nausea, vomiting, abdominal pain, diarrhea, constipation, blood in stool and melena.  Genitourinary: Positive for frequency. Negative for dysuria, urgency, hematuria and flank pain.       4-5x/night  Musculoskeletal: Positive for falls. Negative for myalgias,  back pain, joint pain and neck pain.       2x recently. Unsteady gait. Small steps.   Skin: Negative for itching and rash.       A small venous lake angioma like skin lesion @ the posterior flank.   Neurological: Positive for tremors. Negative for dizziness, tingling, sensory change, speech change, focal weakness, seizures, loss of consciousness, weakness and headaches.       Resting tremor in hands R>L  Endo/Heme/Allergies: Negative for environmental allergies and polydipsia. Does not bruise/bleed easily.  Psychiatric/Behavioral: Positive for memory loss. Negative for depression, suicidal ideas, hallucinations and substance abuse. The patient is not nervous/anxious and does not have insomnia.      Past Medical History  Diagnosis Date  . Other and unspecified hyperlipidemia   . Essential and other specified forms of tremor   . Senile dementia, uncomplicated   . 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 Memory deficit 04/03/2013  . Nodular lesion on surface of skin 07/29/2013    Right flank. Black. About 38mm diameter. Nodular.   . Urinary frequency 04/03/2013    Nocturnal, 4-5x/night.      Past Surgical History  Procedure Laterality Date  . Abdominal hysterectomy  1966  . Tonsillectomy    . Cataract extraction Right 2013    Dr. Herbert Deaner  . Excise skin cancer left upper arm Left 2013    Dr. Jari Pigg   Social History:   reports that she has never smoked. She has never used smokeless tobacco.  She reports that she does not drink alcohol or use illicit drugs.  History reviewed. No pertinent family history.  Medications: Patient's Medications  New Prescriptions   No medications on file  Previous Medications   ACETAMINOPHEN (TYLENOL) 325 MG TABLET    Take 650 mg by mouth. Take one as needed for headache or pain  Modified Medications   No medications on file  Discontinued Medications   No medications on file     Physical Exam: Physical Exam  Constitutional: She is oriented to person, place, and  time. She appears well-developed and well-nourished. No distress.  HENT:  Head: Normocephalic and atraumatic.  Right Ear: External ear normal.  Left Ear: External ear normal.  Nose: Nose normal.  Mouth/Throat: Oropharynx is clear and moist. No oropharyngeal exudate.  Eyes: Conjunctivae and EOM are normal. Pupils are equal, round, and reactive to light. Right eye exhibits no discharge. Left eye exhibits no discharge. No scleral icterus.  Neck: Normal range of motion. Neck supple. No JVD present. No tracheal deviation present. No thyromegaly present.  Cardiovascular: Normal rate, regular rhythm, normal heart sounds and intact distal pulses.  Exam reveals no gallop.   No murmur heard. Pulmonary/Chest: Effort normal and breath sounds normal. No stridor. No respiratory distress. She has no wheezes. She has no rales.  Abdominal: Soft. Bowel sounds are normal. She exhibits no distension. There is no tenderness. There is no rebound and no guarding.  Genitourinary: Guaiac negative stool.  S/p hysterectomy.   Musculoskeletal: Normal range of motion. She exhibits no edema or tenderness.  Unsteady gait.   Lymphadenopathy:    She has no cervical adenopathy.  Neurological: She is alert and oriented to person, place, and time. She has normal reflexes. No cranial nerve deficit. Coordination normal.  Hands resting tremor.   Skin: No rash noted. She is not diaphoretic. No erythema. No pallor.  A small venous lake angioma like skin lesion @ the posterior flank.    Psychiatric: She has a normal mood and affect. Her behavior is normal. Judgment and thought content normal. Her mood appears not anxious. Cognition and memory are impaired. She does not exhibit a depressed mood. She exhibits abnormal recent memory.  Memory lapses     Filed Vitals:   11/25/13 1342  BP: 118/72  Pulse: 80  Weight: 144 lb (65.318 kg)      Labs reviewed: Basic Metabolic Panel:  Recent Labs  09/23/13  NA 139  K 4.3  BUN  14  CREATININE 0.9  TSH 2.91   Liver Function Tests:  Recent Labs  09/23/13  AST 17  ALT 15  ALKPHOS 71   No results for input(s): LIPASE, AMYLASE in the last 8760 hours. No results for input(s): AMMONIA in the last 8760 hours. CBC:  Recent Labs  09/23/13  WBC 12.3  HGB 14.1  HCT 42  PLT 270   Lipid Panel:  Recent Labs  09/23/13  CHOL 285*  HDL 48  LDLCALC 199  TRIG 188*   Anemia Panel: No results for input(s): FOLATE, IRON, VITAMINB12 in the last 8760 hours.  Past Procedures:  NA  Assessment/Plan Anosmia No change.   Memory deficit Memory lapses 09/23/13 TSH 2.909, Vit B12 794 11/25/13 MMSE 21/30-Namenda will be considered upon POA's discussion with the rest of family.    Dyslipidemia off Atorvastatin 40mg  daily-recommended.  09/23/13 LDL 199    Tremor Resting tremor in hands R>L not disabling.  11/25/13 recent fall x2, small steps-PT to evaluate and tx.  Urinary frequency Nocturnal, 4-5x/night.  11/25/13 the patient stated 1x/day. dtr commented may be her memory is reliable.   Abnormality of gait Fall x2 recently. PT to eval and tx as indicated.     Family/ Staff Communication: observe the patient.  Pt to evaluate and tx.   Goals of Care: [place]  Labs/tests ordered: none

## 2013-11-25 NOTE — Assessment & Plan Note (Addendum)
Memory lapses 09/23/13 TSH 2.909, Vit B12 794 11/25/13 MMSE 21/30-Namenda will be considered upon POA's discussion with the rest of family.

## 2013-11-25 NOTE — Assessment & Plan Note (Signed)
No change 

## 2013-12-06 ENCOUNTER — Encounter: Payer: Self-pay | Admitting: Internal Medicine

## 2013-12-07 DIAGNOSIS — M6281 Muscle weakness (generalized): Secondary | ICD-10-CM | POA: Diagnosis not present

## 2013-12-07 DIAGNOSIS — R2681 Unsteadiness on feet: Secondary | ICD-10-CM | POA: Diagnosis not present

## 2013-12-07 DIAGNOSIS — R296 Repeated falls: Secondary | ICD-10-CM | POA: Diagnosis not present

## 2013-12-08 DIAGNOSIS — R2681 Unsteadiness on feet: Secondary | ICD-10-CM | POA: Diagnosis not present

## 2013-12-08 DIAGNOSIS — M6281 Muscle weakness (generalized): Secondary | ICD-10-CM | POA: Diagnosis not present

## 2013-12-08 DIAGNOSIS — R296 Repeated falls: Secondary | ICD-10-CM | POA: Diagnosis not present

## 2013-12-10 DIAGNOSIS — R2681 Unsteadiness on feet: Secondary | ICD-10-CM | POA: Diagnosis not present

## 2013-12-10 DIAGNOSIS — R296 Repeated falls: Secondary | ICD-10-CM | POA: Diagnosis not present

## 2013-12-10 DIAGNOSIS — M6281 Muscle weakness (generalized): Secondary | ICD-10-CM | POA: Diagnosis not present

## 2013-12-14 DIAGNOSIS — M6281 Muscle weakness (generalized): Secondary | ICD-10-CM | POA: Diagnosis not present

## 2013-12-14 DIAGNOSIS — R296 Repeated falls: Secondary | ICD-10-CM | POA: Diagnosis not present

## 2013-12-14 DIAGNOSIS — R2681 Unsteadiness on feet: Secondary | ICD-10-CM | POA: Diagnosis not present

## 2013-12-16 DIAGNOSIS — M6281 Muscle weakness (generalized): Secondary | ICD-10-CM | POA: Diagnosis not present

## 2013-12-16 DIAGNOSIS — R2681 Unsteadiness on feet: Secondary | ICD-10-CM | POA: Diagnosis not present

## 2013-12-16 DIAGNOSIS — R296 Repeated falls: Secondary | ICD-10-CM | POA: Diagnosis not present

## 2013-12-20 DIAGNOSIS — M6281 Muscle weakness (generalized): Secondary | ICD-10-CM | POA: Diagnosis not present

## 2013-12-20 DIAGNOSIS — R296 Repeated falls: Secondary | ICD-10-CM | POA: Diagnosis not present

## 2013-12-20 DIAGNOSIS — R2681 Unsteadiness on feet: Secondary | ICD-10-CM | POA: Diagnosis not present

## 2013-12-22 DIAGNOSIS — R2681 Unsteadiness on feet: Secondary | ICD-10-CM | POA: Diagnosis not present

## 2013-12-22 DIAGNOSIS — M6281 Muscle weakness (generalized): Secondary | ICD-10-CM | POA: Diagnosis not present

## 2013-12-22 DIAGNOSIS — R296 Repeated falls: Secondary | ICD-10-CM | POA: Diagnosis not present

## 2013-12-24 DIAGNOSIS — M6281 Muscle weakness (generalized): Secondary | ICD-10-CM | POA: Diagnosis not present

## 2013-12-24 DIAGNOSIS — R296 Repeated falls: Secondary | ICD-10-CM | POA: Diagnosis not present

## 2013-12-24 DIAGNOSIS — R2681 Unsteadiness on feet: Secondary | ICD-10-CM | POA: Diagnosis not present

## 2013-12-27 DIAGNOSIS — R296 Repeated falls: Secondary | ICD-10-CM | POA: Diagnosis not present

## 2013-12-27 DIAGNOSIS — R2681 Unsteadiness on feet: Secondary | ICD-10-CM | POA: Diagnosis not present

## 2013-12-27 DIAGNOSIS — M6281 Muscle weakness (generalized): Secondary | ICD-10-CM | POA: Diagnosis not present

## 2013-12-28 DIAGNOSIS — R2681 Unsteadiness on feet: Secondary | ICD-10-CM | POA: Diagnosis not present

## 2013-12-28 DIAGNOSIS — M6281 Muscle weakness (generalized): Secondary | ICD-10-CM | POA: Diagnosis not present

## 2013-12-28 DIAGNOSIS — R296 Repeated falls: Secondary | ICD-10-CM | POA: Diagnosis not present

## 2013-12-29 DIAGNOSIS — R2681 Unsteadiness on feet: Secondary | ICD-10-CM | POA: Diagnosis not present

## 2013-12-29 DIAGNOSIS — R296 Repeated falls: Secondary | ICD-10-CM | POA: Diagnosis not present

## 2013-12-29 DIAGNOSIS — M6281 Muscle weakness (generalized): Secondary | ICD-10-CM | POA: Diagnosis not present

## 2014-01-03 DIAGNOSIS — M6281 Muscle weakness (generalized): Secondary | ICD-10-CM | POA: Diagnosis not present

## 2014-01-03 DIAGNOSIS — R2681 Unsteadiness on feet: Secondary | ICD-10-CM | POA: Diagnosis not present

## 2014-01-03 DIAGNOSIS — R296 Repeated falls: Secondary | ICD-10-CM | POA: Diagnosis not present

## 2014-01-05 DIAGNOSIS — R2681 Unsteadiness on feet: Secondary | ICD-10-CM | POA: Diagnosis not present

## 2014-01-05 DIAGNOSIS — R296 Repeated falls: Secondary | ICD-10-CM | POA: Diagnosis not present

## 2014-01-05 DIAGNOSIS — M6281 Muscle weakness (generalized): Secondary | ICD-10-CM | POA: Diagnosis not present

## 2014-02-10 ENCOUNTER — Non-Acute Institutional Stay: Payer: Medicare Other | Admitting: Internal Medicine

## 2014-02-10 ENCOUNTER — Encounter: Payer: Self-pay | Admitting: Internal Medicine

## 2014-02-10 VITALS — BP 120/58 | HR 68 | Temp 97.8°F | Wt 146.0 lb

## 2014-02-10 DIAGNOSIS — H6123 Impacted cerumen, bilateral: Secondary | ICD-10-CM

## 2014-02-10 DIAGNOSIS — H9193 Unspecified hearing loss, bilateral: Secondary | ICD-10-CM | POA: Diagnosis not present

## 2014-02-10 DIAGNOSIS — E785 Hyperlipidemia, unspecified: Secondary | ICD-10-CM | POA: Diagnosis not present

## 2014-02-10 DIAGNOSIS — R413 Other amnesia: Secondary | ICD-10-CM

## 2014-02-10 DIAGNOSIS — R269 Unspecified abnormalities of gait and mobility: Secondary | ICD-10-CM | POA: Diagnosis not present

## 2014-02-10 DIAGNOSIS — R251 Tremor, unspecified: Secondary | ICD-10-CM | POA: Diagnosis not present

## 2014-02-10 DIAGNOSIS — L989 Disorder of the skin and subcutaneous tissue, unspecified: Secondary | ICD-10-CM | POA: Diagnosis not present

## 2014-02-10 DIAGNOSIS — H9190 Unspecified hearing loss, unspecified ear: Secondary | ICD-10-CM | POA: Insufficient documentation

## 2014-02-10 NOTE — Progress Notes (Signed)
Patient ID: Debra [name], female   DOB: March 28, 1921, 81 y.o.   MRN: [id number]    [place] of Service: Clinic (12)    No Known Allergies  Chief Complaint  Patient presents with  . Medical Management of Chronic Issues    memory, anosmia, tremor.  Here with daughter [name]    HPI:  Nodular lesion on surface of skin: seen by Dr. Delman Cheadle. Removed. Reported "benign" to family.  Abnormality of gait: using 4 wheel walker with brakes and seat.  Dyslipidemia: High LDL. Previously treated with atorvastatin. She stopped the medication and does not want to take more. No known reaction. No history of cardiovascular disease.  Impacted cerumen of both ears; Continues with loss of hearing, but does not want to consider aid or have audiology check.  Memory deficit: gradually worsening, but does not want to use medications.  Tremor: gradually worsening. Features of Parkinsonism. She does not want medication.    Medications: Patient's Medications  New Prescriptions   No medications on file  Previous Medications   ACETAMINOPHEN (TYLENOL) 325 MG TABLET    Take 650 mg by mouth. Take one as needed for headache or pain  Modified Medications   No medications on file  Discontinued Medications   No medications on file     Review of Systems  Constitutional: Negative for fever, diaphoresis, activity change, fatigue and unexpected weight change.  HENT: Positive for hearing loss. Negative for ear pain, sore throat, trouble swallowing and voice change.   Eyes: Negative.   Respiratory: Negative for cough, choking, shortness of breath and wheezing.   Cardiovascular: Negative for chest pain, palpitations and leg swelling.  Gastrointestinal: Negative for nausea, abdominal pain, diarrhea and abdominal distention.  Endocrine: Negative.   Genitourinary: Negative for dysuria, urgency, frequency, flank pain and vaginal pain.  Musculoskeletal: Positive for gait problem. Negative  for myalgias, back pain, joint swelling, arthralgias and neck pain.  Skin:       Scar at the right flank at site where she had a black lesion  In 2015.  Allergic/Immunologic: Negative.   Neurological: Positive for tremors and numbness (at toes). Negative for dizziness, seizures and weakness.  Hematological: Negative.   Psychiatric/Behavioral: Negative.     Filed Vitals:   02/10/14 1336  BP: 120/58  Pulse: 68  Temp: 97.8 F (36.6 C)  TempSrc: Oral  Weight: 146 lb (66.225 kg)   Body mass index is 25.87 kg/(m^2).  Physical Exam  Constitutional: She is oriented to person, place, and time. She appears well-developed and well-nourished. No distress.  HENT:  Head: Normocephalic and atraumatic.  Right Ear: External ear normal.  Left Ear: External ear normal.  Nose: Nose normal.  Mouth/Throat: Oropharynx is clear and moist. No oropharyngeal exudate.  Bilateral loose cerumen. Not impacted. TM partially visualized bilaterally. Decreased hearing.  Eyes: Conjunctivae and EOM are normal. Pupils are equal, round, and reactive to light. Right eye exhibits no discharge. Left eye exhibits no discharge. No scleral icterus.  Neck: Normal range of motion. Neck supple. No JVD present. No tracheal deviation present. No thyromegaly present.  Cardiovascular: Normal rate, regular rhythm, normal heart sounds and intact distal pulses.  Exam reveals no gallop.   No murmur heard. Pulmonary/Chest: Effort normal and breath sounds normal. No stridor. No respiratory distress. She has no wheezes. She has no rales.  Abdominal: Soft. Bowel sounds are normal. She exhibits no distension and no mass. There is no tenderness. There is no rebound and  no guarding.  Genitourinary:  S/p hysterectomy.   Musculoskeletal: Normal range of motion. She exhibits no edema or tenderness.  Unstable gait. Using cane.  Lymphadenopathy:    She has no cervical adenopathy.  Neurological: She is alert and oriented to person, place, and  time. She has normal reflexes. No cranial nerve deficit. Coordination normal.  Able to feel vibration at ankles, but not on toes.  Skin: No rash noted. She is not diaphoretic. No erythema. No pallor.  61mm black nodule right flank. Non-tender.   Psychiatric: She has a normal mood and affect. Her behavior is normal. Judgment and thought content normal. Her mood appears not anxious. Cognition and memory are impaired. She does not exhibit a depressed mood. She exhibits abnormal recent memory.  Memory lapses      Labs reviewed: Nursing Home on 11/25/2013  Component Date Value Ref Range Status  . Hemoglobin 09/23/2013 14.1  12.0 - 16.0 g/dL Final  . HCT 09/23/2013 42  36 - 46 % Final  . Platelets 09/23/2013 270  150 - 399 K/L Final  . WBC 09/23/2013 12.3   Final  . Glucose 09/23/2013 87   Final  . BUN 09/23/2013 14  4 - 21 mg/dL Final  . Creatinine 09/23/2013 0.9  0.5 - 1.1 mg/dL Final  . Potassium 09/23/2013 4.3  3.4 - 5.3 mmol/L Final  . Sodium 09/23/2013 139  137 - 147 mmol/L Final  . Triglycerides 09/23/2013 188* 40 - 160 mg/dL Final  . Cholesterol 09/23/2013 285* 0 - 200 mg/dL Final  . HDL 09/23/2013 48  35 - 70 mg/dL Final  . LDL Cholesterol 09/23/2013 199   Final  . Alkaline Phosphatase 09/23/2013 71  25 - 125 U/L Final  . ALT 09/23/2013 15  7 - 35 U/L Final  . AST 09/23/2013 17  13 - 35 U/L Final  . Bilirubin, Total 09/23/2013 0.6   Final  . TSH 09/23/2013 2.91  0.41 - 5.90 uIU/mL Final     Assessment/Plan 1. Nodular lesion on surface of skin resolved  2. Abnormality of gait unchanged  3. Dyslipidemia Discussed treatment. She prefers no medication.  4. Impacted cerumen of both ears Loose cerumen without impaction  5. Memory deficit worsening  6. Tremor Worsening  7. Hearing loss Discussed referral to audiology. She does not want at this time.

## 2014-04-13 DIAGNOSIS — H43393 Other vitreous opacities, bilateral: Secondary | ICD-10-CM | POA: Diagnosis not present

## 2014-04-13 DIAGNOSIS — H01003 Unspecified blepharitis right eye, unspecified eyelid: Secondary | ICD-10-CM | POA: Diagnosis not present

## 2014-04-13 DIAGNOSIS — H3531 Nonexudative age-related macular degeneration: Secondary | ICD-10-CM | POA: Diagnosis not present

## 2014-04-13 DIAGNOSIS — H524 Presbyopia: Secondary | ICD-10-CM | POA: Diagnosis not present

## 2014-08-16 DIAGNOSIS — E785 Hyperlipidemia, unspecified: Secondary | ICD-10-CM | POA: Diagnosis not present

## 2014-08-16 LAB — LIPID PANEL
CHOLESTEROL: 291 mg/dL — AB (ref 0–200)
HDL: 58 mg/dL (ref 35–70)
LDL Cholesterol: 208 mg/dL
TRIGLYCERIDES: 125 mg/dL (ref 40–160)

## 2014-08-25 ENCOUNTER — Non-Acute Institutional Stay: Payer: Medicare Other | Admitting: Internal Medicine

## 2014-08-25 ENCOUNTER — Encounter: Payer: Self-pay | Admitting: Internal Medicine

## 2014-08-25 VITALS — BP 120/60 | HR 80 | Temp 97.5°F | Wt 146.0 lb

## 2014-08-25 DIAGNOSIS — R251 Tremor, unspecified: Secondary | ICD-10-CM | POA: Diagnosis not present

## 2014-08-25 DIAGNOSIS — E785 Hyperlipidemia, unspecified: Secondary | ICD-10-CM

## 2014-08-25 DIAGNOSIS — L989 Disorder of the skin and subcutaneous tissue, unspecified: Secondary | ICD-10-CM | POA: Diagnosis not present

## 2014-08-25 NOTE — Progress Notes (Signed)
Patient ID: Debra [name], female   DOB: [birth year], 81 y.o.   MRN: [id number]    [place] of Service: Clinic (12)     No Known Allergies  Chief Complaint  Patient presents with  . Medical Management of Chronic Issues    cholesterol, memory, tremor. Here with daughter [name]  . consult    daughter wants to talk with Dr. Nyoka Cowden about mother moving to [place], she is starting to need more daily assistant    HPI:  No new complaints. Memory is continuing to fail. Patient is a little more unsteady on her feet but walks well using a 4 wheel walker. Patient and daughter are considering a move to [place] but they have not yet determined the proper time.  Hyperlipidemia is unchanged. She is 81 years of age and I do not think the addition of lipid lowering agents as like to change her life span or add to the quality of life.  Medications: Patient's Medications  New Prescriptions   No medications on file  Previous Medications   ACETAMINOPHEN (TYLENOL) 325 MG TABLET    Take 650 mg by mouth. Take one as needed for headache or pain  Modified Medications   No medications on file  Discontinued Medications   No medications on file     Review of Systems  Constitutional: Negative for fever, diaphoresis, activity change, fatigue and unexpected weight change.  HENT: Positive for hearing loss. Negative for ear pain, sore throat, trouble swallowing and voice change.   Eyes: Negative.   Respiratory: Negative for cough, choking, shortness of breath and wheezing.   Cardiovascular: Negative for chest pain, palpitations and leg swelling.  Gastrointestinal: Negative for nausea, abdominal pain, diarrhea and abdominal distention.  Endocrine: Negative.   Genitourinary: Negative for dysuria, urgency, frequency, flank pain and vaginal pain.  Musculoskeletal: Positive for gait problem. Negative for myalgias, back pain, joint swelling, arthralgias and neck pain.  Skin:       Scar at the  right flank at site where she had a black lesion  In 2015.  Allergic/Immunologic: Negative.   Neurological: Positive for tremors and numbness (at toes). Negative for dizziness, seizures and weakness.  Hematological: Negative.   Psychiatric/Behavioral: Negative.     Filed Vitals:   08/25/14 1339  BP: 120/60  Pulse: 80  Temp: 97.5 F (36.4 C)  TempSrc: Oral  Weight: 146 lb (66.225 kg)  SpO2: 97%   Body mass index is 25.87 kg/(m^2).  Physical Exam  Constitutional: She appears well-developed and well-nourished. No distress.  HENT:  Head: Normocephalic and atraumatic.  Right Ear: External ear normal.  Left Ear: External ear normal.  Nose: Nose normal.  Mouth/Throat: Oropharynx is clear and moist. No oropharyngeal exudate.  Bilateral loose cerumen. Not impacted. TM partially visualized bilaterally. Decreased hearing.  Eyes: Conjunctivae and EOM are normal. Pupils are equal, round, and reactive to light. Right eye exhibits no discharge. Left eye exhibits no discharge. No scleral icterus.  Neck: Normal range of motion. Neck supple. No JVD present. No tracheal deviation present. No thyromegaly present.  Cardiovascular: Normal rate, regular rhythm, normal heart sounds and intact distal pulses.  Exam reveals no gallop.   No murmur heard. Pulmonary/Chest: Effort normal and breath sounds normal. No stridor. No respiratory distress. She has no wheezes. She has no rales.  Abdominal: Soft. Bowel sounds are normal. She exhibits no distension and no mass. There is no tenderness. There is no rebound and no guarding.  Genitourinary:  S/p hysterectomy.   Musculoskeletal: Normal range of motion. She exhibits no edema or tenderness.  Unstable gait. Using cane and 4 wheel walker.  Lymphadenopathy:    She has no cervical adenopathy.  Neurological: She is alert. She has normal reflexes. No cranial nerve deficit. Coordination normal.  Able to feel vibration at ankles, but not on toes. Bilateral tremor  most consistent with BET. Significant memory deficit.  Skin: No rash noted. She is not diaphoretic. No erythema. No pallor.  Psychiatric: She has a normal mood and affect. Her behavior is normal. Judgment and thought content normal. Her mood appears not anxious. Cognition and memory are impaired. She does not exhibit a depressed mood. She exhibits abnormal recent memory.     Labs reviewed: Abstract on 08/17/2014  Component Date Value Ref Range Status  . Triglycerides 08/16/2014 125  40 - 160 mg/dL Final  . Cholesterol 08/16/2014 291* 0 - 200 mg/dL Final  . HDL 08/16/2014 58  35 - 70 mg/dL Final  . LDL Cholesterol 08/16/2014 208   Final     Assessment/Plan  1. Dyslipidemia No treatment warranted  2. Tremor unchanged  3. Nodular lesion on surface of skin Excised following her last visit with me.

## 2014-09-14 ENCOUNTER — Telehealth: Payer: Self-pay

## 2014-09-14 NOTE — Telephone Encounter (Signed)
Left message on cell and home phone informing Debra [name] form is ready for pick-up

## 2014-09-14 NOTE — Telephone Encounter (Signed)
Patient's daughter [name] called (message was left on Debbie's/CMA voicemail) to get a status on [employer] paperwork that was dropped off on 09/01/14. Patient will be moving next week. [name] needs this paperwork before Friday.   I looked in Dr.Green's paperwork on the ledge. Form was moved from review and sign folder to Urgent folder for completion . Message will be sent to Dr.Green as a FYI

## 2014-09-14 NOTE — Telephone Encounter (Signed)
Forms completed and signed.

## 2014-10-15 ENCOUNTER — Encounter (HOSPITAL_COMMUNITY): Payer: Self-pay | Admitting: *Deleted

## 2014-10-15 ENCOUNTER — Emergency Department (HOSPITAL_COMMUNITY)
Admission: EM | Admit: 2014-10-15 | Discharge: 2014-10-15 | Disposition: A | Discharge status: Home / Self Care | Payer: Medicare Other | Attending: Emergency Medicine | Admitting: Emergency Medicine

## 2014-10-15 DIAGNOSIS — F0390 Unspecified dementia without behavioral disturbance: Secondary | ICD-10-CM | POA: Diagnosis not present

## 2014-10-15 DIAGNOSIS — Z8639 Personal history of other endocrine, nutritional and metabolic disease: Secondary | ICD-10-CM | POA: Diagnosis not present

## 2014-10-15 DIAGNOSIS — Z872 Personal history of diseases of the skin and subcutaneous tissue: Secondary | ICD-10-CM | POA: Diagnosis not present

## 2014-10-15 DIAGNOSIS — Y9389 Activity, other specified: Secondary | ICD-10-CM | POA: Diagnosis not present

## 2014-10-15 DIAGNOSIS — R5381 Other malaise: Secondary | ICD-10-CM | POA: Diagnosis not present

## 2014-10-15 DIAGNOSIS — R251 Tremor, unspecified: Secondary | ICD-10-CM | POA: Diagnosis not present

## 2014-10-15 DIAGNOSIS — S0990XA Unspecified injury of head, initial encounter: Secondary | ICD-10-CM | POA: Diagnosis present

## 2014-10-15 DIAGNOSIS — I6789 Other cerebrovascular disease: Secondary | ICD-10-CM | POA: Diagnosis not present

## 2014-10-15 DIAGNOSIS — Y998 Other external cause status: Secondary | ICD-10-CM | POA: Insufficient documentation

## 2014-10-15 DIAGNOSIS — W1839XA Other fall on same level, initial encounter: Secondary | ICD-10-CM | POA: Diagnosis not present

## 2014-10-15 DIAGNOSIS — Y9289 Other specified places as the place of occurrence of the external cause: Secondary | ICD-10-CM | POA: Diagnosis not present

## 2014-10-15 LAB — URINALYSIS, ROUTINE W REFLEX MICROSCOPIC
Bilirubin Urine: NEGATIVE
Glucose, UA: NEGATIVE mg/dL
Ketones, ur: 15 mg/dL — AB
LEUKOCYTES UA: NEGATIVE
Nitrite: NEGATIVE
PH: 5.5 (ref 5.0–8.0)
Protein, ur: NEGATIVE mg/dL
Specific Gravity, Urine: 1.014 (ref 1.005–1.030)
Urobilinogen, UA: 0.2 mg/dL (ref 0.0–1.0)

## 2014-10-15 LAB — URINE MICROSCOPIC-ADD ON

## 2014-10-15 NOTE — ED Notes (Signed)
Patient ambulated successfully to the restroom and back with walker after eating crackers and juice

## 2014-10-15 NOTE — Discharge Instructions (Signed)
Concentrate on eating 3 meals, each day. Also, drink plenty of fluids.   Fatigue Fatigue is feeling tired all of the time, a lack of energy, or a lack of motivation. Occasional or mild fatigue is often a normal response to activity or life in general. However, long-lasting (chronic) or extreme fatigue may indicate an underlying medical condition. HOME CARE INSTRUCTIONS  Watch your fatigue for any changes. The following actions may help to lessen any discomfort you are feeling:  Talk to your health care provider about how much sleep you need each night. Try to get the required amount every night.  Take medicines only as directed by your health care provider.  Eat a healthy and nutritious diet. Ask your health care provider if you need help changing your diet.  Drink enough fluid to keep your urine clear or pale yellow.  Practice ways of relaxing, such as yoga, meditation, massage therapy, or acupuncture.  Exercise regularly.   Change situations that cause you stress. Try to keep your work and personal routine reasonable.  Do not abuse illegal drugs.  Limit alcohol intake to no more than 1 drink per day for nonpregnant women and 2 drinks per day for men. One drink equals 12 ounces of beer, 5 ounces of wine, or 1 ounces of hard liquor.  Take a multivitamin, if directed by your health care provider. SEEK MEDICAL CARE IF:   Your fatigue does not get better.  You have a fever.   You have unintentional weight loss or gain.  You have headaches.   You have difficulty:   Falling asleep.  Sleeping throughout the night.  You feel angry, guilty, anxious, or sad.   You are unable to have a bowel movement (constipation).   You skin is dry.   Your legs or another part of your body is swollen.  SEEK IMMEDIATE MEDICAL CARE IF:   You feel confused.   Your vision is blurry.  You feel faint or pass out.   You have a severe headache.   You have severe abdominal,  pelvic, or back pain.   You have chest pain, shortness of breath, or an irregular or fast heartbeat.   You are unable to urinate or you urinate less than normal.   You develop abnormal bleeding, such as bleeding from the rectum, vagina, nose, lungs, or nipples.  You vomit blood.   You have thoughts about harming yourself or committing suicide.   You are worried that you might harm someone else.    This information is not intended to replace advice given to you by your health care provider. Make sure you discuss any questions you have with your health care provider.   Document Released: 10/21/2006 Document Revised: 01/14/2014 Document Reviewed: 04/27/2013 Elsevier Interactive Patient Education 2016 [place] refers to the changes in your body that occur during a period of inactivity. Deconditioning results in changes to your heart, lungs, and muscles. These changes decrease your ability to endure activity, resulting in feelings of fatigue and weakness. Deconditioning can occur after only a few days of bed rest or inactivity. The longer the period of inactivity, the more severe your symptoms of deconditioning will be. After longer periods of inactivity, it will also take longer for you to return to your previous level of functioning. Deconditioning can be mild, moderate, or severe:  Mild. Your condition interferes with your ability to perform your usual types of exercise, such as running, biking, or swimming.  Moderate. Your  condition interferes with your ability to do normal everyday activities. This may include walking, grocery shopping, or doing chores or lawn work.  Severe. Your condition interferes with your ability to perform minimal activity or normal self-care. CAUSES  Some common reasons for inactivity that may result in deconditioning include:  Illnesses, such as cancer, stroke, heart attack, fibromyalgia, or chronic fatigue  syndrome.  Injuries, especially back injuries, broken bones, or injury to ligaments or tendons.  Surgery or a long stay in the hospital for any reason.  Pregnancy, especially with conditions that require long periods of bed rest. RISK FACTORS Anything that results in a period of hospitalization or bed rest will put you at risk of deconditioning. Some other factors that can increase the risk include:  Obesity.  Poor nutrition.  Old age.  Injuries or illnesses that interfere with movement and activity. SIGNS AND SYMPTOMS  Feeling weak.  Feeling tired.  Shortness of breath with minor exertion.  Your heart beating faster than normal. You may or may not notice this without taking your pulse.  Pain or discomfort with activity.  Decreased strength.  Decreased sense of balance.  Decreased endurance.  Difficulty participating in usual forms of exercise.  Difficulty doing activities of daily living, such as grocery shopping or chores.  Difficulty walking around the house and doing basic self-care, such as getting to the bathroom, preparing meals, or doing laundry. DIAGNOSIS  There is no specific test to diagnose deconditioning. Your health care provider will take your medical history and do a physical exam. During the physical exam, the health care provider will check for signs of deconditioning, such as:  Decreased size of muscles.  Decreased strength.  Difficulty with balance.  Shortness of breath or abnormally increased heart rate after minor exertion. TREATMENT  Treatment usually involves a structured exercise program in which activity is increased gradually. Your health care provider will determine which exercises are right for you. The exercise program will likely include aerobic exercise and strength training. Aerobic exercise helps improve the functioning of the heart and lungs as well as the muscles. Strength training helps improve muscle size and strength. Both of  these types of exercise will improve your endurance. You may be referred to a physical therapist who can create a safe strengthening program for you to follow. HOME CARE INSTRUCTIONS  Follow the exercise program recommended by your health care provider or physical therapist.  Do not increase your exercise any faster than directed.  Eat a healthy diet.  If your health care provider thinks that you need to lose weight, consider seeing a dietitian to help you do so in a healthy way.  Do not use any tobacco products, including cigarettes, chewing tobacco, or electronic cigarettes. If you need help quitting, ask your health care provider.  Take medicines only as directed by your health care provider.  Keep all follow-up visits as directed by your health care provider. This is important. SEEK MEDICAL CARE IF:  You are not able to carry out the prescribed exercise program.  You are not able to carry out your usual level of activity.  You are having trouble doing normal household chores or caring for yourself.  You are becoming increasingly fatigued and weak.  You become light-headed when rising to a sitting or standing position.  Your level of endurance decreases after having improved. SEEK IMMEDIATE MEDICAL CARE IF:  You have chest pain.  You are very short of breath.  You have any episodes of passing  out.   This information is not intended to replace advice given to you by your health care provider. Make sure you discuss any questions you have with your health care provider.   Document Released: 05/10/2013 Document Reviewed: 05/10/2013 Elsevier Interactive Patient Education [employer].

## 2014-10-15 NOTE — ED Provider Notes (Signed)
CSN: [id number]     Arrival date & time 10/15/14  0945 History   First MD Initiated Contact with Patient 10/15/14 [phone]     Chief Complaint  Patient presents with  . Fall     (Consider location/radiation/quality/duration/timing/severity/associated sxs/prior Treatment) HPI   Debra [name] is a 81 y.o. female who presents for evaluation of "found on floor". She lives in an assisted living facility. Apparently, someone there, found her on the floor. Unknown mechanism of fall or how she ended up there. Patient cannot give history. Nursing reports the EMS states that she has a new injury to the back of her head.  Level V Caveat- dementia   Past Medical History  Diagnosis Date  . Other and unspecified hyperlipidemia   . Essential and other specified forms of tremor   . Senile dementia, uncomplicated   . 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 Memory deficit 04/03/2013  . Nodular lesion on surface of skin 07/29/2013    Right flank. Black. About 53mm diameter. Nodular.   . Urinary frequency 04/03/2013    Nocturnal, 4-5x/night.      Past Surgical History  Procedure Laterality Date  . Abdominal hysterectomy  1966  . Tonsillectomy    . Cataract extraction Right 2013    Dr. Herbert Deaner  . Excise skin cancer left upper arm Left 2013    Dr. Jari Pigg   No family history on file. Social History  Substance Use Topics  . Smoking status: Never Smoker   . Smokeless tobacco: Never Used  . Alcohol Use: No   OB History    No data available     Review of Systems  Unable to perform ROS     Allergies  Review of patient's allergies indicates no known allergies.  Home Medications   Prior to Admission medications   Not on File   BP 176/41 mmHg  Pulse 82  Temp(Src) 97.7 F (36.5 C) (Oral)  Resp 16  SpO2 100% Physical Exam  Constitutional: She appears well-developed. No distress.  Elderly, frail  HENT:  Head: Normocephalic and atraumatic.  Right Ear: External ear normal.  Left Ear: External ear  normal.  No palpable or visible acute scalp or cranial abnormality  Eyes: Conjunctivae and EOM are normal. Pupils are equal, round, and reactive to light.  Neck: Normal range of motion and phonation normal. Neck supple.  Cardiovascular: Normal rate, regular rhythm and normal heart sounds.   Pulmonary/Chest: Effort normal and breath sounds normal. She exhibits no bony tenderness.  Abdominal: Soft. There is no tenderness.  Musculoskeletal: Normal range of motion.  Neurological: She is alert. No cranial nerve deficit or sensory deficit. She exhibits normal muscle tone. Coordination normal.  No dysarthria. Senile tremor.  Skin: Skin is warm, dry and intact.  Psychiatric: Her behavior is normal.  Nursing note and vitals reviewed.   ED Course  Procedures (including critical care time)  Medications - No data to display  Patient Vitals for the past 24 hrs:  BP Temp Temp src Pulse Resp SpO2  10/15/14 1221 (!) 176/41 mmHg 97.7 F (36.5 C) Oral 82 16 100 %  10/15/14 1000 (!) 153/50 mmHg 98 F (36.7 C) Oral 77 15 96 %  10/15/14 0959 (!) 153/50 mmHg - - 76 21 95 %   11:30- . Patient has had numerous episodes of intermittent shaking, during which time her cardiac monitor alarms and monitor tracings are indicative of tremor with underlying normal QRS.   11:31 AM Reevaluation with update and discussion. After  initial assessment and treatment, an updated evaluation reveals patient remains alert, and confused. Her son is here now and she recognizes him as her youngest son. He states that she is normal except weaker than usual. The patient has not yet. He never had any drink today. Will offer nutrition and reassess. Jatniel Verastegui L   1:01 PM Reevaluation with update and discussion. After initial assessment and treatment, an updated evaluation reveals she was able to eat and drink here. She was able to ambulate about 50 feet to the bathroom using a walker and then back without difficulty. Discussed  findings with patient's son and her daughter in law, all questions were answered. Teja Judice L   Labs Review Labs Reviewed  URINALYSIS, ROUTINE W REFLEX MICROSCOPIC (NOT AT [facility]) - Abnormal; Notable for the following:    APPearance CLOUDY (*)    Hgb urine dipstick SMALL (*)    Ketones, ur 15 (*)    All other components within normal limits  URINE CULTURE  URINE MICROSCOPIC-ADD ON    Imaging Review No results found. I have personally reviewed and evaluated these images and lab results as part of my medical decision-making.   EKG Interpretation   Date/Time:  Saturday October 15 2014 09:59:45 EDT Ventricular Rate:  80 PR Interval:    QRS Duration: 83 QT Interval:  347 QTC Calculation: 400 R Axis:   17 Text Interpretation:  indeterminate rhythym Artifact Abnormal R-wave  progression, early transition Nonspecific repol abnormality, diffuse leads  No significant change was found Confirmed by Eulis Foster  MD, Albert Devaul ([id number]) on  10/15/2014 11:17:37 AM      MDM   Final diagnoses:  Malaise    Patient found on floor, mechanism unknown. No sign of traumatic injury. No evidence for urinary tract infection or suggestion for metabolic instability.  Nursing Notes Reviewed/ Care Coordinated Applicable Imaging Reviewed Interpretation of Laboratory Data incorporated into ED treatment  The patient appears reasonably screened and/or stabilized for discharge and I doubt any other medical condition or other [facility] requiring further screening, evaluation, or treatment in the ED at this time prior to discharge.  Plan: Home Medications- usual; Home Treatments- rest; return here if the recommended treatment, does not improve the symptoms; Recommended follow up- PCP prn worsening symptoms   Daleen Bo, MD 10/15/14 1302

## 2014-10-15 NOTE — ED Notes (Signed)
Pt is from [facility].  Staff found patient on floor in bedroom.  Pt doesn't remember falling and has no c/o pain. There are no obvious signs of injury.

## 2014-10-15 NOTE — ED Notes (Signed)
Bed: EX61 Expected date: 10/15/14 Expected time: 9:48 AM Means of arrival: Ambulance Comments: Nsg home pt found on floor, no injuries

## 2014-10-15 NOTE — ED Notes (Signed)
Patient given call bell and instructed to use when needed or to use restroom to give [place] a urine sample

## 2014-10-16 LAB — URINE CULTURE

## 2014-10-19 DIAGNOSIS — M6281 Muscle weakness (generalized): Secondary | ICD-10-CM | POA: Diagnosis not present

## 2014-10-19 DIAGNOSIS — R2681 Unsteadiness on feet: Secondary | ICD-10-CM | POA: Diagnosis not present

## 2014-10-19 DIAGNOSIS — R488 Other symbolic dysfunctions: Secondary | ICD-10-CM | POA: Diagnosis not present

## 2014-10-19 DIAGNOSIS — R296 Repeated falls: Secondary | ICD-10-CM | POA: Diagnosis not present

## 2014-10-20 DIAGNOSIS — R296 Repeated falls: Secondary | ICD-10-CM | POA: Diagnosis not present

## 2014-10-20 DIAGNOSIS — R488 Other symbolic dysfunctions: Secondary | ICD-10-CM | POA: Diagnosis not present

## 2014-10-20 DIAGNOSIS — M6281 Muscle weakness (generalized): Secondary | ICD-10-CM | POA: Diagnosis not present

## 2014-10-20 DIAGNOSIS — R2681 Unsteadiness on feet: Secondary | ICD-10-CM | POA: Diagnosis not present

## 2014-10-21 DIAGNOSIS — R296 Repeated falls: Secondary | ICD-10-CM | POA: Diagnosis not present

## 2014-10-21 DIAGNOSIS — M6281 Muscle weakness (generalized): Secondary | ICD-10-CM | POA: Diagnosis not present

## 2014-10-21 DIAGNOSIS — R2681 Unsteadiness on feet: Secondary | ICD-10-CM | POA: Diagnosis not present

## 2014-10-21 DIAGNOSIS — R488 Other symbolic dysfunctions: Secondary | ICD-10-CM | POA: Diagnosis not present

## 2014-10-22 DIAGNOSIS — Z23 Encounter for immunization: Secondary | ICD-10-CM | POA: Diagnosis not present

## 2014-10-24 DIAGNOSIS — R488 Other symbolic dysfunctions: Secondary | ICD-10-CM | POA: Diagnosis not present

## 2014-10-24 DIAGNOSIS — R296 Repeated falls: Secondary | ICD-10-CM | POA: Diagnosis not present

## 2014-10-24 DIAGNOSIS — R2681 Unsteadiness on feet: Secondary | ICD-10-CM | POA: Diagnosis not present

## 2014-10-24 DIAGNOSIS — M6281 Muscle weakness (generalized): Secondary | ICD-10-CM | POA: Diagnosis not present

## 2014-10-25 ENCOUNTER — Encounter: Payer: Self-pay | Admitting: Internal Medicine

## 2014-10-25 DIAGNOSIS — R488 Other symbolic dysfunctions: Secondary | ICD-10-CM | POA: Diagnosis not present

## 2014-10-25 DIAGNOSIS — R2681 Unsteadiness on feet: Secondary | ICD-10-CM | POA: Diagnosis not present

## 2014-10-25 DIAGNOSIS — M6281 Muscle weakness (generalized): Secondary | ICD-10-CM | POA: Diagnosis not present

## 2014-10-25 DIAGNOSIS — R296 Repeated falls: Secondary | ICD-10-CM | POA: Diagnosis not present

## 2014-10-26 DIAGNOSIS — R488 Other symbolic dysfunctions: Secondary | ICD-10-CM | POA: Diagnosis not present

## 2014-10-26 DIAGNOSIS — R2681 Unsteadiness on feet: Secondary | ICD-10-CM | POA: Diagnosis not present

## 2014-10-26 DIAGNOSIS — M6281 Muscle weakness (generalized): Secondary | ICD-10-CM | POA: Diagnosis not present

## 2014-10-26 DIAGNOSIS — R296 Repeated falls: Secondary | ICD-10-CM | POA: Diagnosis not present

## 2014-10-27 DIAGNOSIS — R2681 Unsteadiness on feet: Secondary | ICD-10-CM | POA: Diagnosis not present

## 2014-10-27 DIAGNOSIS — R488 Other symbolic dysfunctions: Secondary | ICD-10-CM | POA: Diagnosis not present

## 2014-10-27 DIAGNOSIS — R296 Repeated falls: Secondary | ICD-10-CM | POA: Diagnosis not present

## 2014-10-27 DIAGNOSIS — M6281 Muscle weakness (generalized): Secondary | ICD-10-CM | POA: Diagnosis not present

## 2014-10-28 DIAGNOSIS — R296 Repeated falls: Secondary | ICD-10-CM | POA: Diagnosis not present

## 2014-10-28 DIAGNOSIS — M6281 Muscle weakness (generalized): Secondary | ICD-10-CM | POA: Diagnosis not present

## 2014-10-28 DIAGNOSIS — R2681 Unsteadiness on feet: Secondary | ICD-10-CM | POA: Diagnosis not present

## 2014-10-28 DIAGNOSIS — R488 Other symbolic dysfunctions: Secondary | ICD-10-CM | POA: Diagnosis not present

## 2014-10-31 DIAGNOSIS — M6281 Muscle weakness (generalized): Secondary | ICD-10-CM | POA: Diagnosis not present

## 2014-10-31 DIAGNOSIS — R2681 Unsteadiness on feet: Secondary | ICD-10-CM | POA: Diagnosis not present

## 2014-10-31 DIAGNOSIS — R488 Other symbolic dysfunctions: Secondary | ICD-10-CM | POA: Diagnosis not present

## 2014-10-31 DIAGNOSIS — R296 Repeated falls: Secondary | ICD-10-CM | POA: Diagnosis not present

## 2014-11-02 DIAGNOSIS — R2681 Unsteadiness on feet: Secondary | ICD-10-CM | POA: Diagnosis not present

## 2014-11-02 DIAGNOSIS — R488 Other symbolic dysfunctions: Secondary | ICD-10-CM | POA: Diagnosis not present

## 2014-11-02 DIAGNOSIS — M6281 Muscle weakness (generalized): Secondary | ICD-10-CM | POA: Diagnosis not present

## 2014-11-02 DIAGNOSIS — R296 Repeated falls: Secondary | ICD-10-CM | POA: Diagnosis not present

## 2014-11-03 DIAGNOSIS — R488 Other symbolic dysfunctions: Secondary | ICD-10-CM | POA: Diagnosis not present

## 2014-11-03 DIAGNOSIS — R2681 Unsteadiness on feet: Secondary | ICD-10-CM | POA: Diagnosis not present

## 2014-11-03 DIAGNOSIS — M6281 Muscle weakness (generalized): Secondary | ICD-10-CM | POA: Diagnosis not present

## 2014-11-03 DIAGNOSIS — R296 Repeated falls: Secondary | ICD-10-CM | POA: Diagnosis not present

## 2014-11-04 DIAGNOSIS — M6281 Muscle weakness (generalized): Secondary | ICD-10-CM | POA: Diagnosis not present

## 2014-11-04 DIAGNOSIS — R296 Repeated falls: Secondary | ICD-10-CM | POA: Diagnosis not present

## 2014-11-04 DIAGNOSIS — R488 Other symbolic dysfunctions: Secondary | ICD-10-CM | POA: Diagnosis not present

## 2014-11-04 DIAGNOSIS — R2681 Unsteadiness on feet: Secondary | ICD-10-CM | POA: Diagnosis not present

## 2014-11-07 DIAGNOSIS — M6281 Muscle weakness (generalized): Secondary | ICD-10-CM | POA: Diagnosis not present

## 2014-11-07 DIAGNOSIS — R296 Repeated falls: Secondary | ICD-10-CM | POA: Diagnosis not present

## 2014-11-07 DIAGNOSIS — R2681 Unsteadiness on feet: Secondary | ICD-10-CM | POA: Diagnosis not present

## 2014-11-07 DIAGNOSIS — R488 Other symbolic dysfunctions: Secondary | ICD-10-CM | POA: Diagnosis not present

## 2014-11-08 DIAGNOSIS — R2681 Unsteadiness on feet: Secondary | ICD-10-CM | POA: Diagnosis not present

## 2014-11-08 DIAGNOSIS — R488 Other symbolic dysfunctions: Secondary | ICD-10-CM | POA: Diagnosis not present

## 2014-11-08 DIAGNOSIS — M6281 Muscle weakness (generalized): Secondary | ICD-10-CM | POA: Diagnosis not present

## 2014-11-08 DIAGNOSIS — R296 Repeated falls: Secondary | ICD-10-CM | POA: Diagnosis not present

## 2014-11-09 DIAGNOSIS — R296 Repeated falls: Secondary | ICD-10-CM | POA: Diagnosis not present

## 2014-11-09 DIAGNOSIS — R2681 Unsteadiness on feet: Secondary | ICD-10-CM | POA: Diagnosis not present

## 2014-11-09 DIAGNOSIS — M6281 Muscle weakness (generalized): Secondary | ICD-10-CM | POA: Diagnosis not present

## 2014-11-09 DIAGNOSIS — R488 Other symbolic dysfunctions: Secondary | ICD-10-CM | POA: Diagnosis not present

## 2014-11-10 DIAGNOSIS — R296 Repeated falls: Secondary | ICD-10-CM | POA: Diagnosis not present

## 2014-11-10 DIAGNOSIS — R488 Other symbolic dysfunctions: Secondary | ICD-10-CM | POA: Diagnosis not present

## 2014-11-10 DIAGNOSIS — M6281 Muscle weakness (generalized): Secondary | ICD-10-CM | POA: Diagnosis not present

## 2014-11-10 DIAGNOSIS — R2681 Unsteadiness on feet: Secondary | ICD-10-CM | POA: Diagnosis not present

## 2014-11-11 DIAGNOSIS — M6281 Muscle weakness (generalized): Secondary | ICD-10-CM | POA: Diagnosis not present

## 2014-11-11 DIAGNOSIS — R296 Repeated falls: Secondary | ICD-10-CM | POA: Diagnosis not present

## 2014-11-11 DIAGNOSIS — R2681 Unsteadiness on feet: Secondary | ICD-10-CM | POA: Diagnosis not present

## 2014-11-11 DIAGNOSIS — R488 Other symbolic dysfunctions: Secondary | ICD-10-CM | POA: Diagnosis not present

## 2014-11-14 DIAGNOSIS — R296 Repeated falls: Secondary | ICD-10-CM | POA: Diagnosis not present

## 2014-11-14 DIAGNOSIS — R488 Other symbolic dysfunctions: Secondary | ICD-10-CM | POA: Diagnosis not present

## 2014-11-14 DIAGNOSIS — M6281 Muscle weakness (generalized): Secondary | ICD-10-CM | POA: Diagnosis not present

## 2014-11-14 DIAGNOSIS — R2681 Unsteadiness on feet: Secondary | ICD-10-CM | POA: Diagnosis not present

## 2014-11-16 DIAGNOSIS — M6281 Muscle weakness (generalized): Secondary | ICD-10-CM | POA: Diagnosis not present

## 2014-11-16 DIAGNOSIS — R2681 Unsteadiness on feet: Secondary | ICD-10-CM | POA: Diagnosis not present

## 2014-11-16 DIAGNOSIS — R488 Other symbolic dysfunctions: Secondary | ICD-10-CM | POA: Diagnosis not present

## 2014-11-16 DIAGNOSIS — R296 Repeated falls: Secondary | ICD-10-CM | POA: Diagnosis not present

## 2014-11-17 DIAGNOSIS — R488 Other symbolic dysfunctions: Secondary | ICD-10-CM | POA: Diagnosis not present

## 2014-11-17 DIAGNOSIS — R2681 Unsteadiness on feet: Secondary | ICD-10-CM | POA: Diagnosis not present

## 2014-11-17 DIAGNOSIS — M6281 Muscle weakness (generalized): Secondary | ICD-10-CM | POA: Diagnosis not present

## 2014-11-17 DIAGNOSIS — R296 Repeated falls: Secondary | ICD-10-CM | POA: Diagnosis not present

## 2014-11-18 DIAGNOSIS — R2681 Unsteadiness on feet: Secondary | ICD-10-CM | POA: Diagnosis not present

## 2014-11-18 DIAGNOSIS — R296 Repeated falls: Secondary | ICD-10-CM | POA: Diagnosis not present

## 2014-11-18 DIAGNOSIS — R488 Other symbolic dysfunctions: Secondary | ICD-10-CM | POA: Diagnosis not present

## 2014-11-18 DIAGNOSIS — M6281 Muscle weakness (generalized): Secondary | ICD-10-CM | POA: Diagnosis not present

## 2014-11-21 DIAGNOSIS — R2681 Unsteadiness on feet: Secondary | ICD-10-CM | POA: Diagnosis not present

## 2014-11-21 DIAGNOSIS — R296 Repeated falls: Secondary | ICD-10-CM | POA: Diagnosis not present

## 2014-11-21 DIAGNOSIS — R488 Other symbolic dysfunctions: Secondary | ICD-10-CM | POA: Diagnosis not present

## 2014-11-21 DIAGNOSIS — M6281 Muscle weakness (generalized): Secondary | ICD-10-CM | POA: Diagnosis not present

## 2014-11-22 DIAGNOSIS — M6281 Muscle weakness (generalized): Secondary | ICD-10-CM | POA: Diagnosis not present

## 2014-11-22 DIAGNOSIS — R488 Other symbolic dysfunctions: Secondary | ICD-10-CM | POA: Diagnosis not present

## 2014-11-22 DIAGNOSIS — R296 Repeated falls: Secondary | ICD-10-CM | POA: Diagnosis not present

## 2014-11-22 DIAGNOSIS — R2681 Unsteadiness on feet: Secondary | ICD-10-CM | POA: Diagnosis not present

## 2014-11-23 DIAGNOSIS — R488 Other symbolic dysfunctions: Secondary | ICD-10-CM | POA: Diagnosis not present

## 2014-11-23 DIAGNOSIS — R296 Repeated falls: Secondary | ICD-10-CM | POA: Diagnosis not present

## 2014-11-23 DIAGNOSIS — M6281 Muscle weakness (generalized): Secondary | ICD-10-CM | POA: Diagnosis not present

## 2014-11-23 DIAGNOSIS — R2681 Unsteadiness on feet: Secondary | ICD-10-CM | POA: Diagnosis not present

## 2014-11-24 DIAGNOSIS — M6281 Muscle weakness (generalized): Secondary | ICD-10-CM | POA: Diagnosis not present

## 2014-11-24 DIAGNOSIS — R2681 Unsteadiness on feet: Secondary | ICD-10-CM | POA: Diagnosis not present

## 2014-11-24 DIAGNOSIS — R296 Repeated falls: Secondary | ICD-10-CM | POA: Diagnosis not present

## 2014-11-24 DIAGNOSIS — R488 Other symbolic dysfunctions: Secondary | ICD-10-CM | POA: Diagnosis not present

## 2014-11-30 DIAGNOSIS — R488 Other symbolic dysfunctions: Secondary | ICD-10-CM | POA: Diagnosis not present

## 2014-11-30 DIAGNOSIS — R296 Repeated falls: Secondary | ICD-10-CM | POA: Diagnosis not present

## 2014-11-30 DIAGNOSIS — M6281 Muscle weakness (generalized): Secondary | ICD-10-CM | POA: Diagnosis not present

## 2014-11-30 DIAGNOSIS — R2681 Unsteadiness on feet: Secondary | ICD-10-CM | POA: Diagnosis not present

## 2014-12-02 DIAGNOSIS — R2681 Unsteadiness on feet: Secondary | ICD-10-CM | POA: Diagnosis not present

## 2014-12-02 DIAGNOSIS — M6281 Muscle weakness (generalized): Secondary | ICD-10-CM | POA: Diagnosis not present

## 2014-12-02 DIAGNOSIS — R488 Other symbolic dysfunctions: Secondary | ICD-10-CM | POA: Diagnosis not present

## 2014-12-02 DIAGNOSIS — R296 Repeated falls: Secondary | ICD-10-CM | POA: Diagnosis not present

## 2014-12-05 DIAGNOSIS — R2681 Unsteadiness on feet: Secondary | ICD-10-CM | POA: Diagnosis not present

## 2014-12-05 DIAGNOSIS — R488 Other symbolic dysfunctions: Secondary | ICD-10-CM | POA: Diagnosis not present

## 2014-12-05 DIAGNOSIS — R296 Repeated falls: Secondary | ICD-10-CM | POA: Diagnosis not present

## 2014-12-05 DIAGNOSIS — M6281 Muscle weakness (generalized): Secondary | ICD-10-CM | POA: Diagnosis not present

## 2014-12-07 DIAGNOSIS — R296 Repeated falls: Secondary | ICD-10-CM | POA: Diagnosis not present

## 2014-12-07 DIAGNOSIS — M6281 Muscle weakness (generalized): Secondary | ICD-10-CM | POA: Diagnosis not present

## 2014-12-07 DIAGNOSIS — R2681 Unsteadiness on feet: Secondary | ICD-10-CM | POA: Diagnosis not present

## 2014-12-07 DIAGNOSIS — R488 Other symbolic dysfunctions: Secondary | ICD-10-CM | POA: Diagnosis not present

## 2014-12-08 DIAGNOSIS — R2681 Unsteadiness on feet: Secondary | ICD-10-CM | POA: Diagnosis not present

## 2014-12-08 DIAGNOSIS — M6281 Muscle weakness (generalized): Secondary | ICD-10-CM | POA: Diagnosis not present

## 2014-12-12 DIAGNOSIS — M6281 Muscle weakness (generalized): Secondary | ICD-10-CM | POA: Diagnosis not present

## 2014-12-12 DIAGNOSIS — R2681 Unsteadiness on feet: Secondary | ICD-10-CM | POA: Diagnosis not present

## 2014-12-13 DIAGNOSIS — M6281 Muscle weakness (generalized): Secondary | ICD-10-CM | POA: Diagnosis not present

## 2014-12-13 DIAGNOSIS — R2681 Unsteadiness on feet: Secondary | ICD-10-CM | POA: Diagnosis not present

## 2014-12-15 DIAGNOSIS — M6281 Muscle weakness (generalized): Secondary | ICD-10-CM | POA: Diagnosis not present

## 2014-12-15 DIAGNOSIS — R2681 Unsteadiness on feet: Secondary | ICD-10-CM | POA: Diagnosis not present

## 2014-12-16 DIAGNOSIS — M6281 Muscle weakness (generalized): Secondary | ICD-10-CM | POA: Diagnosis not present

## 2014-12-16 DIAGNOSIS — R2681 Unsteadiness on feet: Secondary | ICD-10-CM | POA: Diagnosis not present

## 2015-02-08 ENCOUNTER — Encounter: Payer: Self-pay | Admitting: Internal Medicine

## 2015-02-08 ENCOUNTER — Ambulatory Visit (INDEPENDENT_AMBULATORY_CARE_PROVIDER_SITE_OTHER): Payer: Medicare Other | Admitting: Internal Medicine

## 2015-02-08 VITALS — BP 126/66 | HR 75 | Temp 97.4°F | Resp 20 | Ht 63.0 in | Wt 139.6 lb

## 2015-02-08 DIAGNOSIS — R269 Unspecified abnormalities of gait and mobility: Secondary | ICD-10-CM | POA: Diagnosis not present

## 2015-02-08 DIAGNOSIS — L989 Disorder of the skin and subcutaneous tissue, unspecified: Secondary | ICD-10-CM | POA: Diagnosis not present

## 2015-02-08 DIAGNOSIS — R251 Tremor, unspecified: Secondary | ICD-10-CM

## 2015-02-08 DIAGNOSIS — R413 Other amnesia: Secondary | ICD-10-CM | POA: Diagnosis not present

## 2015-02-08 DIAGNOSIS — R443 Hallucinations, unspecified: Secondary | ICD-10-CM | POA: Diagnosis not present

## 2015-02-08 DIAGNOSIS — H6123 Impacted cerumen, bilateral: Secondary | ICD-10-CM

## 2015-02-08 DIAGNOSIS — Z23 Encounter for immunization: Secondary | ICD-10-CM

## 2015-02-08 NOTE — Progress Notes (Signed)
Patient ID: Debra [name], female   DOB: 1921/07/27, 82 y.o.   MRN: [id number]    Facility  [place]    Place of Service:   OFFICE    No Known Allergies  Chief Complaint  Patient presents with  . Medical Management of Chronic Issues    6 month follow-up for Dyslipidemia, Tremors  . OTHER    Daughter in room with patient  . Immunizations    Prevnar 13 given    HPI:  Living at [facility]. Husband also lives there.  Has lost about 10 # since the move. More active.  Hallucinations - generally nocturnal. Says that there is a man in the room. Not particularly frightened. He has asked for help from neighbors on at least one occasion.  Abnormality of gait - no falls. Using a cane or walker regularly.  Memory deficit - slow progression-   Tremor - unchanged   Impacted cerumen of both ears - Previous impaction has not returned   Nodular lesion on surface of skin - Present in 2015 and surgically removed with residual scar at the right flank .  Need for vaccination with 13-polyvalent pneumococcal conjugate vaccine - Plan: Pneumococcal conjugate vaccine 13-valent IM       Medications: Patient's Medications   No medications on file    Review of Systems  Constitutional: Negative for fever, diaphoresis, activity change, fatigue and unexpected weight change.  HENT: Positive for hearing loss. Negative for ear pain, sore throat, trouble swallowing and voice change.   Eyes: Negative.   Respiratory: Negative for cough, choking, shortness of breath and wheezing.   Cardiovascular: Negative for chest pain, palpitations and leg swelling.  Gastrointestinal: Negative for nausea, abdominal pain, diarrhea and abdominal distention.  Endocrine: Negative.   Genitourinary: Negative for dysuria, urgency, frequency, flank pain and vaginal pain.  Musculoskeletal: Positive for gait problem. Negative for myalgias, back pain, joint swelling, arthralgias and neck pain.  Skin:       Scar at the right  flank at site where she had a black lesion  In 2015.  Allergic/Immunologic: Negative.   Neurological: Positive for tremors and numbness (at toes). Negative for dizziness, seizures and weakness.  Hematological: Negative.   Psychiatric/Behavioral: Negative.     Filed Vitals:   02/08/15 1125  BP: 126/66  Pulse: 75  Temp: 97.4 F (36.3 C)  TempSrc: Oral  Resp: 20  Height: 5' 3" (1.6 m)  Weight: 139 lb 9.6 oz (63.322 kg)  SpO2: 97%   Body mass index is 24.74 kg/(m^2). Filed Weights   02/08/15 1125  Weight: 139 lb 9.6 oz (63.322 kg)     Physical Exam  Constitutional: She appears well-developed and well-nourished. No distress.  HENT:  Head: Normocephalic and atraumatic.  Right Ear: External ear normal.  Left Ear: External ear normal.  Nose: Nose normal.  Mouth/Throat: Oropharynx is clear and moist. No oropharyngeal exudate.  Bilateral loose cerumen. Not impacted. TM partially visualized bilaterally. Decreased hearing.  Eyes: Conjunctivae and EOM are normal. Pupils are equal, round, and reactive to light. Right eye exhibits no discharge. Left eye exhibits no discharge. No scleral icterus.  Neck: Normal range of motion. Neck supple. No JVD present. No tracheal deviation present. No thyromegaly present.  Cardiovascular: Normal rate, regular rhythm, normal heart sounds and intact distal pulses.  Exam reveals no gallop.   No murmur heard. Pulmonary/Chest: Effort normal and breath sounds normal. No stridor. No respiratory distress. She has no wheezes. She has no rales.  Abdominal: Soft.  Bowel sounds are normal. She exhibits no distension and no mass. There is no tenderness. There is no rebound and no guarding.  Genitourinary:  S/p hysterectomy.   Musculoskeletal: Normal range of motion. She exhibits no edema or tenderness.  Unstable gait. Using cane and 4 wheel walker.  Lymphadenopathy:    She has no cervical adenopathy.  Neurological: She is alert. She has normal reflexes. No  cranial nerve deficit. Coordination normal.  Able to feel vibration at ankles, but not on toes. Bilateral tremor most consistent with BET. Significant memory deficit.  Skin: No rash noted. She is not diaphoretic. No erythema. No pallor.  Psychiatric: She has a normal mood and affect. Her behavior is normal. Judgment and thought content normal. Her mood appears not anxious. Cognition and memory are impaired. She does not exhibit a depressed mood. She exhibits abnormal recent memory.    Labs reviewed: Lab Summary Latest Ref Rng 08/16/2014 09/23/2013 11/14/2008 11/13/2008 11/12/2008  Hemoglobin 12.0 - 16.0 g/dL (None) 14.1 12.2 11.9(L) 12.0  Hematocrit 36 - 46 % (None) 42 35.7(L) 35.5(L) 35.5(L)  White count - (None) 12.3 11.4(H) 11.1(H) 14.1(H)  Platelet count 150 - 399 K/L (None) 270 157 152 151  Sodium 137 - 147 mmol/L (None) 139 137 140 139  Potassium 3.4 - 5.3 mmol/L (None) 4.3 3.8 3.8 3.8  Calcium 8.4 - 10.5 mg/dL (None) (None) 9.4 9.5 9.5  Phosphorus - (None) (None) (None) (None) (None)  Creatinine 0.5 - 1.1 mg/dL (None) 0.9 0.94 0.81 0.95  AST 13 - 35 U/L (None) _0 Alk Phos 25 - 125 U/L (None) 71 72 68 60  Bilirubin 0.3 - 1.2 mg/dL (None) (None) 0.6 0.9 0.6  Glucose - (None) 87 99 89 117(H)  Cholesterol 0 - 200 mg/dL 291(A) 285(A) (None) (None) (None)  HDL cholesterol 35 - 70 mg/dL 58 48 (None) (None) (None)  Triglycerides 40 - 160 mg/dL 125 188(A) (None) (None) (None)  LDL Direct - (None) (None) (None) (None) (None)  LDL Calc - 208 199 (None) (None) (None)  Total protein 6.0 - 8.3 g/dL (None) (None) 5.7(L) 5.5(L) 5.4(L)  Albumin 3.5 - 5.2 g/dL (None) (None) 3.1(L) 3.1(L) 3.3(L)   Lab Results  Component Value Date   TSH 2.91 09/23/2013   Lab Results  Component Value Date   BUN 14 09/23/2013   BUN 15 11/14/2008   BUN 17 11/13/2008   No results found for: HGBA1C  Assessment/Plan  1. Abnormality of gait Continue use of cane or walker  2. Memory deficit Continue  to observe  3. Tremor  observe   4. Impacted cerumen of both ears resolved   5 . Nodular lesion on surface of skin resolved  6. Need for vaccination with 13-polyvalent pneumococcal conjugate vaccine - Pneumococcal conjugate vaccine 13-valent IM  7. Hallucinations As long as these are not frightening and she does not have an associated paranoia, I would simply observe and not add any antipsychotic drugs. Potential side effects of antipsychotics outweigh potential benefit at this time.

## 2015-02-08 NOTE — Patient Instructions (Signed)
Call if hallucinations become frightening or paranoia occurs.

## 2015-02-09 ENCOUNTER — Encounter: Payer: Self-pay | Admitting: Internal Medicine

## 2015-02-24 DIAGNOSIS — M6281 Muscle weakness (generalized): Secondary | ICD-10-CM | POA: Diagnosis not present

## 2015-02-24 DIAGNOSIS — R2681 Unsteadiness on feet: Secondary | ICD-10-CM | POA: Diagnosis not present

## 2015-02-24 DIAGNOSIS — R296 Repeated falls: Secondary | ICD-10-CM | POA: Diagnosis not present

## 2015-02-24 DIAGNOSIS — R29898 Other symptoms and signs involving the musculoskeletal system: Secondary | ICD-10-CM | POA: Diagnosis not present

## 2015-02-27 ENCOUNTER — Telehealth: Payer: Self-pay | Admitting: *Deleted

## 2015-02-27 DIAGNOSIS — R2681 Unsteadiness on feet: Secondary | ICD-10-CM | POA: Diagnosis not present

## 2015-02-27 DIAGNOSIS — M6281 Muscle weakness (generalized): Secondary | ICD-10-CM | POA: Diagnosis not present

## 2015-02-27 DIAGNOSIS — R29898 Other symptoms and signs involving the musculoskeletal system: Secondary | ICD-10-CM | POA: Diagnosis not present

## 2015-02-27 DIAGNOSIS — R296 Repeated falls: Secondary | ICD-10-CM | POA: Diagnosis not present

## 2015-02-27 MED ORDER — NAPROXEN SODIUM 220 MG PO TABS: ORAL_TABLET | ORAL | Status: DC

## 2015-02-27 NOTE — Addendum Note (Signed)
Addended by: Rafael Bihari A on: 02/27/2015 02:45 PM   Modules accepted: Orders

## 2015-02-27 NOTE — Telephone Encounter (Signed)
Debra [name], daughter called with medication concerns. Patient lives at [facility]. Patient has some bruised ribs from a fall last week. Daughter wants to know if patient can take Aleve for the pain/discomfort and inflammation. Or do you suggest something else. Needs order faxed to [facility] Fax: [phone]. Please Advise.

## 2015-02-27 NOTE — Telephone Encounter (Signed)
She can take Aleve 220 mg up to 3 times in 24 hours for pain.  Please fax the order to [facility].

## 2015-02-27 NOTE — Telephone Encounter (Signed)
Faxed order to [facility] and [place], daughter notified

## 2015-02-28 DIAGNOSIS — R2681 Unsteadiness on feet: Secondary | ICD-10-CM | POA: Diagnosis not present

## 2015-02-28 DIAGNOSIS — R29898 Other symptoms and signs involving the musculoskeletal system: Secondary | ICD-10-CM | POA: Diagnosis not present

## 2015-02-28 DIAGNOSIS — M6281 Muscle weakness (generalized): Secondary | ICD-10-CM | POA: Diagnosis not present

## 2015-02-28 DIAGNOSIS — R296 Repeated falls: Secondary | ICD-10-CM | POA: Diagnosis not present

## 2015-03-01 DIAGNOSIS — R2681 Unsteadiness on feet: Secondary | ICD-10-CM | POA: Diagnosis not present

## 2015-03-01 DIAGNOSIS — M6281 Muscle weakness (generalized): Secondary | ICD-10-CM | POA: Diagnosis not present

## 2015-03-01 DIAGNOSIS — R296 Repeated falls: Secondary | ICD-10-CM | POA: Diagnosis not present

## 2015-03-01 DIAGNOSIS — R29898 Other symptoms and signs involving the musculoskeletal system: Secondary | ICD-10-CM | POA: Diagnosis not present

## 2015-03-02 DIAGNOSIS — R296 Repeated falls: Secondary | ICD-10-CM | POA: Diagnosis not present

## 2015-03-02 DIAGNOSIS — R29898 Other symptoms and signs involving the musculoskeletal system: Secondary | ICD-10-CM | POA: Diagnosis not present

## 2015-03-02 DIAGNOSIS — R2681 Unsteadiness on feet: Secondary | ICD-10-CM | POA: Diagnosis not present

## 2015-03-02 DIAGNOSIS — M6281 Muscle weakness (generalized): Secondary | ICD-10-CM | POA: Diagnosis not present

## 2015-03-03 DIAGNOSIS — R296 Repeated falls: Secondary | ICD-10-CM | POA: Diagnosis not present

## 2015-03-03 DIAGNOSIS — R2681 Unsteadiness on feet: Secondary | ICD-10-CM | POA: Diagnosis not present

## 2015-03-03 DIAGNOSIS — M6281 Muscle weakness (generalized): Secondary | ICD-10-CM | POA: Diagnosis not present

## 2015-03-03 DIAGNOSIS — R29898 Other symptoms and signs involving the musculoskeletal system: Secondary | ICD-10-CM | POA: Diagnosis not present

## 2015-03-07 DIAGNOSIS — R2681 Unsteadiness on feet: Secondary | ICD-10-CM | POA: Diagnosis not present

## 2015-03-07 DIAGNOSIS — R29898 Other symptoms and signs involving the musculoskeletal system: Secondary | ICD-10-CM | POA: Diagnosis not present

## 2015-03-07 DIAGNOSIS — M6281 Muscle weakness (generalized): Secondary | ICD-10-CM | POA: Diagnosis not present

## 2015-03-07 DIAGNOSIS — R296 Repeated falls: Secondary | ICD-10-CM | POA: Diagnosis not present

## 2015-03-08 DIAGNOSIS — R2681 Unsteadiness on feet: Secondary | ICD-10-CM | POA: Diagnosis not present

## 2015-03-08 DIAGNOSIS — M6281 Muscle weakness (generalized): Secondary | ICD-10-CM | POA: Diagnosis not present

## 2015-03-08 DIAGNOSIS — R29898 Other symptoms and signs involving the musculoskeletal system: Secondary | ICD-10-CM | POA: Diagnosis not present

## 2015-03-08 DIAGNOSIS — R296 Repeated falls: Secondary | ICD-10-CM | POA: Diagnosis not present

## 2015-03-09 DIAGNOSIS — R29898 Other symptoms and signs involving the musculoskeletal system: Secondary | ICD-10-CM | POA: Diagnosis not present

## 2015-03-09 DIAGNOSIS — R296 Repeated falls: Secondary | ICD-10-CM | POA: Diagnosis not present

## 2015-03-09 DIAGNOSIS — M6281 Muscle weakness (generalized): Secondary | ICD-10-CM | POA: Diagnosis not present

## 2015-03-09 DIAGNOSIS — R2681 Unsteadiness on feet: Secondary | ICD-10-CM | POA: Diagnosis not present

## 2015-03-10 DIAGNOSIS — M6281 Muscle weakness (generalized): Secondary | ICD-10-CM | POA: Diagnosis not present

## 2015-03-10 DIAGNOSIS — R296 Repeated falls: Secondary | ICD-10-CM | POA: Diagnosis not present

## 2015-03-10 DIAGNOSIS — R2681 Unsteadiness on feet: Secondary | ICD-10-CM | POA: Diagnosis not present

## 2015-03-10 DIAGNOSIS — R29898 Other symptoms and signs involving the musculoskeletal system: Secondary | ICD-10-CM | POA: Diagnosis not present

## 2015-03-14 DIAGNOSIS — M6281 Muscle weakness (generalized): Secondary | ICD-10-CM | POA: Diagnosis not present

## 2015-03-14 DIAGNOSIS — R2681 Unsteadiness on feet: Secondary | ICD-10-CM | POA: Diagnosis not present

## 2015-03-14 DIAGNOSIS — R29898 Other symptoms and signs involving the musculoskeletal system: Secondary | ICD-10-CM | POA: Diagnosis not present

## 2015-03-14 DIAGNOSIS — R296 Repeated falls: Secondary | ICD-10-CM | POA: Diagnosis not present

## 2015-03-15 DIAGNOSIS — R2681 Unsteadiness on feet: Secondary | ICD-10-CM | POA: Diagnosis not present

## 2015-03-15 DIAGNOSIS — M6281 Muscle weakness (generalized): Secondary | ICD-10-CM | POA: Diagnosis not present

## 2015-03-15 DIAGNOSIS — R296 Repeated falls: Secondary | ICD-10-CM | POA: Diagnosis not present

## 2015-03-15 DIAGNOSIS — R29898 Other symptoms and signs involving the musculoskeletal system: Secondary | ICD-10-CM | POA: Diagnosis not present

## 2015-03-16 DIAGNOSIS — M6281 Muscle weakness (generalized): Secondary | ICD-10-CM | POA: Diagnosis not present

## 2015-03-16 DIAGNOSIS — R29898 Other symptoms and signs involving the musculoskeletal system: Secondary | ICD-10-CM | POA: Diagnosis not present

## 2015-03-16 DIAGNOSIS — R296 Repeated falls: Secondary | ICD-10-CM | POA: Diagnosis not present

## 2015-03-16 DIAGNOSIS — R2681 Unsteadiness on feet: Secondary | ICD-10-CM | POA: Diagnosis not present

## 2015-03-17 DIAGNOSIS — R2681 Unsteadiness on feet: Secondary | ICD-10-CM | POA: Diagnosis not present

## 2015-03-17 DIAGNOSIS — R296 Repeated falls: Secondary | ICD-10-CM | POA: Diagnosis not present

## 2015-03-17 DIAGNOSIS — M6281 Muscle weakness (generalized): Secondary | ICD-10-CM | POA: Diagnosis not present

## 2015-03-17 DIAGNOSIS — R29898 Other symptoms and signs involving the musculoskeletal system: Secondary | ICD-10-CM | POA: Diagnosis not present

## 2015-03-20 DIAGNOSIS — R2681 Unsteadiness on feet: Secondary | ICD-10-CM | POA: Diagnosis not present

## 2015-03-20 DIAGNOSIS — R296 Repeated falls: Secondary | ICD-10-CM | POA: Diagnosis not present

## 2015-03-20 DIAGNOSIS — R29898 Other symptoms and signs involving the musculoskeletal system: Secondary | ICD-10-CM | POA: Diagnosis not present

## 2015-03-20 DIAGNOSIS — M6281 Muscle weakness (generalized): Secondary | ICD-10-CM | POA: Diagnosis not present

## 2015-03-21 DIAGNOSIS — R29898 Other symptoms and signs involving the musculoskeletal system: Secondary | ICD-10-CM | POA: Diagnosis not present

## 2015-03-21 DIAGNOSIS — R2681 Unsteadiness on feet: Secondary | ICD-10-CM | POA: Diagnosis not present

## 2015-03-21 DIAGNOSIS — R296 Repeated falls: Secondary | ICD-10-CM | POA: Diagnosis not present

## 2015-03-21 DIAGNOSIS — M6281 Muscle weakness (generalized): Secondary | ICD-10-CM | POA: Diagnosis not present

## 2015-03-22 DIAGNOSIS — R2681 Unsteadiness on feet: Secondary | ICD-10-CM | POA: Diagnosis not present

## 2015-03-22 DIAGNOSIS — M6281 Muscle weakness (generalized): Secondary | ICD-10-CM | POA: Diagnosis not present

## 2015-03-22 DIAGNOSIS — R29898 Other symptoms and signs involving the musculoskeletal system: Secondary | ICD-10-CM | POA: Diagnosis not present

## 2015-03-22 DIAGNOSIS — R296 Repeated falls: Secondary | ICD-10-CM | POA: Diagnosis not present

## 2015-03-24 DIAGNOSIS — R29898 Other symptoms and signs involving the musculoskeletal system: Secondary | ICD-10-CM | POA: Diagnosis not present

## 2015-03-24 DIAGNOSIS — R2681 Unsteadiness on feet: Secondary | ICD-10-CM | POA: Diagnosis not present

## 2015-03-24 DIAGNOSIS — R296 Repeated falls: Secondary | ICD-10-CM | POA: Diagnosis not present

## 2015-03-24 DIAGNOSIS — M6281 Muscle weakness (generalized): Secondary | ICD-10-CM | POA: Diagnosis not present

## 2015-03-27 DIAGNOSIS — R29898 Other symptoms and signs involving the musculoskeletal system: Secondary | ICD-10-CM | POA: Diagnosis not present

## 2015-03-27 DIAGNOSIS — M6281 Muscle weakness (generalized): Secondary | ICD-10-CM | POA: Diagnosis not present

## 2015-03-27 DIAGNOSIS — R296 Repeated falls: Secondary | ICD-10-CM | POA: Diagnosis not present

## 2015-03-27 DIAGNOSIS — R2681 Unsteadiness on feet: Secondary | ICD-10-CM | POA: Diagnosis not present

## 2015-03-28 DIAGNOSIS — M6281 Muscle weakness (generalized): Secondary | ICD-10-CM | POA: Diagnosis not present

## 2015-03-28 DIAGNOSIS — R29898 Other symptoms and signs involving the musculoskeletal system: Secondary | ICD-10-CM | POA: Diagnosis not present

## 2015-03-28 DIAGNOSIS — R296 Repeated falls: Secondary | ICD-10-CM | POA: Diagnosis not present

## 2015-03-28 DIAGNOSIS — R2681 Unsteadiness on feet: Secondary | ICD-10-CM | POA: Diagnosis not present

## 2015-03-29 DIAGNOSIS — R296 Repeated falls: Secondary | ICD-10-CM | POA: Diagnosis not present

## 2015-03-29 DIAGNOSIS — M6281 Muscle weakness (generalized): Secondary | ICD-10-CM | POA: Diagnosis not present

## 2015-03-29 DIAGNOSIS — R2681 Unsteadiness on feet: Secondary | ICD-10-CM | POA: Diagnosis not present

## 2015-03-29 DIAGNOSIS — R29898 Other symptoms and signs involving the musculoskeletal system: Secondary | ICD-10-CM | POA: Diagnosis not present

## 2015-03-31 DIAGNOSIS — R29898 Other symptoms and signs involving the musculoskeletal system: Secondary | ICD-10-CM | POA: Diagnosis not present

## 2015-03-31 DIAGNOSIS — R296 Repeated falls: Secondary | ICD-10-CM | POA: Diagnosis not present

## 2015-03-31 DIAGNOSIS — M6281 Muscle weakness (generalized): Secondary | ICD-10-CM | POA: Diagnosis not present

## 2015-03-31 DIAGNOSIS — R2681 Unsteadiness on feet: Secondary | ICD-10-CM | POA: Diagnosis not present

## 2015-04-03 DIAGNOSIS — R29898 Other symptoms and signs involving the musculoskeletal system: Secondary | ICD-10-CM | POA: Diagnosis not present

## 2015-04-03 DIAGNOSIS — R2681 Unsteadiness on feet: Secondary | ICD-10-CM | POA: Diagnosis not present

## 2015-04-03 DIAGNOSIS — M6281 Muscle weakness (generalized): Secondary | ICD-10-CM | POA: Diagnosis not present

## 2015-04-03 DIAGNOSIS — R296 Repeated falls: Secondary | ICD-10-CM | POA: Diagnosis not present

## 2015-04-04 DIAGNOSIS — R296 Repeated falls: Secondary | ICD-10-CM | POA: Diagnosis not present

## 2015-04-04 DIAGNOSIS — M6281 Muscle weakness (generalized): Secondary | ICD-10-CM | POA: Diagnosis not present

## 2015-04-04 DIAGNOSIS — R29898 Other symptoms and signs involving the musculoskeletal system: Secondary | ICD-10-CM | POA: Diagnosis not present

## 2015-04-04 DIAGNOSIS — R2681 Unsteadiness on feet: Secondary | ICD-10-CM | POA: Diagnosis not present

## 2015-04-05 DIAGNOSIS — M6281 Muscle weakness (generalized): Secondary | ICD-10-CM | POA: Diagnosis not present

## 2015-04-05 DIAGNOSIS — R2681 Unsteadiness on feet: Secondary | ICD-10-CM | POA: Diagnosis not present

## 2015-04-05 DIAGNOSIS — R29898 Other symptoms and signs involving the musculoskeletal system: Secondary | ICD-10-CM | POA: Diagnosis not present

## 2015-04-05 DIAGNOSIS — R296 Repeated falls: Secondary | ICD-10-CM | POA: Diagnosis not present

## 2015-06-13 ENCOUNTER — Ambulatory Visit: Payer: Medicare Other | Admitting: Internal Medicine

## 2015-06-24 DIAGNOSIS — N390 Urinary tract infection, site not specified: Secondary | ICD-10-CM | POA: Diagnosis not present

## 2015-06-26 ENCOUNTER — Ambulatory Visit (INDEPENDENT_AMBULATORY_CARE_PROVIDER_SITE_OTHER): Payer: Medicare Other | Admitting: Nurse Practitioner

## 2015-06-26 ENCOUNTER — Encounter: Payer: Self-pay | Admitting: Nurse Practitioner

## 2015-06-26 VITALS — BP 122/64 | HR 74 | Temp 97.8°F | Resp 17 | Ht 63.0 in | Wt 130.2 lb

## 2015-06-26 DIAGNOSIS — M25472 Effusion, left ankle: Secondary | ICD-10-CM

## 2015-06-26 DIAGNOSIS — R634 Abnormal weight loss: Secondary | ICD-10-CM | POA: Diagnosis not present

## 2015-06-26 NOTE — Patient Instructions (Signed)
Will get blood work today Keep legs elevated when sitting (above level of heart is best) May use compression hose during the day

## 2015-06-26 NOTE — Progress Notes (Signed)
Patient ID: Debra [name], female   DOB: 02/21/1921, 82 y.o.   MRN: [id number]    PCP: Estill Dooms, MD  Advanced Directive information Does patient have an advance directive?: Yes, Type of Advance Directive: [place];Living will  No Known Allergies  Chief Complaint  Patient presents with  . Acute Visit    Left ankle swelling since Thrus/Fri with no injury or pain.      HPI: Patient is a 82 y.o. female seen in the office today due to left ankle swelling. No injury noted. No pain. Does not effect gait Has lost some weight since she moved from friends home but this was needed per daughter because she was on a steady gain.  Legs dependant most the day, side sleeper.    Review of Systems:  Review of Systems  Constitutional: Negative for activity change and appetite change.  Respiratory: Negative for cough and shortness of breath.   Cardiovascular: Positive for leg swelling. Negative for chest pain and palpitations.  Gastrointestinal: Negative for abdominal pain and abdominal distention.  Genitourinary: Negative for dysuria.  Musculoskeletal: Positive for joint swelling (left ankle). Negative for arthralgias and gait problem.    Past Medical History  Diagnosis Date  . Other and unspecified hyperlipidemia   . Essential and other specified forms of tremor   . Senile dementia, uncomplicated   . 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 Memory deficit 04/03/2013  . Nodular lesion on surface of skin 07/29/2013    Right flank. Black. About 14mm diameter. Nodular.   . Urinary frequency 04/03/2013    Nocturnal, 4-5x/night.      Past Surgical History  Procedure Laterality Date  . Abdominal hysterectomy  1966  . Tonsillectomy    . Cataract extraction Right 2013    Dr. Herbert Deaner  . Excise skin cancer left upper arm Left 2013    Dr. Jari Pigg   Social History:   reports that she has never smoked. She has never used smokeless tobacco. She reports that she does not drink alcohol or use  illicit drugs.  History reviewed. No pertinent family history.  Medications: Patient's Medications  New Prescriptions   No medications on file  Previous Medications   NAPROXEN SODIUM (ALEVE) 220 MG TABLET    Take one tablet by mouth up to three times daily as needed for pain  Modified Medications   No medications on file  Discontinued Medications   No medications on file     Physical Exam:  Filed Vitals:   06/26/15 1612  BP: 122/64  Pulse: 74  Temp: 97.8 F (36.6 C)  TempSrc: Oral  Resp: 17  Height: 5\' 3"  (1.6 m)  Weight: 130 lb 3.2 oz (59.058 kg)  SpO2: 95%   Body mass index is 23.07 kg/(m^2).  Physical Exam  Constitutional: She is oriented to person, place, and time. She appears well-developed and well-nourished.  Cardiovascular: Normal rate, regular rhythm and normal heart sounds.   Pulmonary/Chest: Effort normal and breath sounds normal.  Musculoskeletal: She exhibits edema (2+ pitting edema noted to left ankle and foot, skin shiny). She exhibits no tenderness.  Neurological: She is alert and oriented to person, place, and time.  Skin: Skin is warm.    Labs reviewed: Basic Metabolic Panel: No results for input(s): NA, K, CL, CO2, GLUCOSE, BUN, CREATININE, CALCIUM, MG, PHOS, TSH in the last 8760 hours. Liver Function Tests: No results for input(s): AST, ALT, ALKPHOS, BILITOT, PROT, ALBUMIN in the last 8760 hours. No results for input(s):  LIPASE, AMYLASE in the last 8760 hours. No results for input(s): AMMONIA in the last 8760 hours. CBC: No results for input(s): WBC, NEUTROABS, HGB, HCT, MCV, PLT in the last 8760 hours. Lipid Panel:  Recent Labs  08/16/14  CHOL 291*  HDL 58  LDLCALC 208  TRIG 125   TSH: No results for input(s): TSH in the last 8760 hours. A1C: No results found for: HGBA1C   Assessment/Plan 1. Left ankle swelling -no signs of trama, has not taken any aleve recently, on no other medications. To elevate legs, may use compression  hose during the day - Comprehensive metabolic panel  2. Loss of weight -possible with low albumin contributing to swelling - Comprehensive metabolic panel   Kymiah Araiza K. Harle Battiest  [facility] [phone] 8 am - 5 pm) [phone] (after hours)

## 2015-06-27 LAB — COMPREHENSIVE METABOLIC PANEL
A/G RATIO: 1.5 (ref 1.2–2.2)
ALK PHOS: 71 IU/L (ref 39–117)
ALT: 14 IU/L (ref 0–32)
AST: 20 IU/L (ref 0–40)
Albumin: 3.8 g/dL (ref 3.2–4.6)
BUN / CREAT RATIO: 25 (ref 12–28)
BUN: 22 mg/dL (ref 10–36)
Bilirubin Total: 0.3 mg/dL (ref 0.0–1.2)
CO2: 24 mmol/L (ref 18–29)
Calcium: 9.9 mg/dL (ref 8.7–10.3)
Chloride: 103 mmol/L (ref 96–106)
Creatinine, Ser: 0.88 mg/dL (ref 0.57–1.00)
GFR calc Af Amer: 66 mL/min/{1.73_m2} (ref >59)
GFR, EST NON AFRICAN AMERICAN: 57 mL/min/{1.73_m2} — AB (ref >59)
GLOBULIN, TOTAL: 2.5 g/dL (ref 1.5–4.5)
Glucose: 95 mg/dL (ref 65–99)
POTASSIUM: 3.6 mmol/L (ref 3.5–5.2)
SODIUM: 141 mmol/L (ref 134–144)
Total Protein: 6.3 g/dL (ref 6.0–8.5)

## 2015-07-05 DIAGNOSIS — N390 Urinary tract infection, site not specified: Secondary | ICD-10-CM | POA: Diagnosis not present

## 2015-07-05 DIAGNOSIS — Z792 Long term (current) use of antibiotics: Secondary | ICD-10-CM | POA: Diagnosis not present

## 2015-07-05 DIAGNOSIS — R251 Tremor, unspecified: Secondary | ICD-10-CM | POA: Diagnosis not present

## 2015-07-05 DIAGNOSIS — F0390 Unspecified dementia without behavioral disturbance: Secondary | ICD-10-CM | POA: Diagnosis not present

## 2015-07-06 DIAGNOSIS — F0390 Unspecified dementia without behavioral disturbance: Secondary | ICD-10-CM | POA: Diagnosis not present

## 2015-07-06 DIAGNOSIS — R251 Tremor, unspecified: Secondary | ICD-10-CM | POA: Diagnosis not present

## 2015-07-06 DIAGNOSIS — Z792 Long term (current) use of antibiotics: Secondary | ICD-10-CM | POA: Diagnosis not present

## 2015-07-06 DIAGNOSIS — N390 Urinary tract infection, site not specified: Secondary | ICD-10-CM | POA: Diagnosis not present

## 2015-07-07 DIAGNOSIS — R251 Tremor, unspecified: Secondary | ICD-10-CM | POA: Diagnosis not present

## 2015-07-07 DIAGNOSIS — F0390 Unspecified dementia without behavioral disturbance: Secondary | ICD-10-CM | POA: Diagnosis not present

## 2015-07-07 DIAGNOSIS — Z792 Long term (current) use of antibiotics: Secondary | ICD-10-CM | POA: Diagnosis not present

## 2015-07-07 DIAGNOSIS — N390 Urinary tract infection, site not specified: Secondary | ICD-10-CM | POA: Diagnosis not present

## 2015-07-08 DIAGNOSIS — F0390 Unspecified dementia without behavioral disturbance: Secondary | ICD-10-CM | POA: Diagnosis not present

## 2015-07-08 DIAGNOSIS — Z792 Long term (current) use of antibiotics: Secondary | ICD-10-CM | POA: Diagnosis not present

## 2015-07-08 DIAGNOSIS — N390 Urinary tract infection, site not specified: Secondary | ICD-10-CM | POA: Diagnosis not present

## 2015-07-08 DIAGNOSIS — R251 Tremor, unspecified: Secondary | ICD-10-CM | POA: Diagnosis not present

## 2015-07-09 DIAGNOSIS — F0390 Unspecified dementia without behavioral disturbance: Secondary | ICD-10-CM | POA: Diagnosis not present

## 2015-07-09 DIAGNOSIS — Z792 Long term (current) use of antibiotics: Secondary | ICD-10-CM | POA: Diagnosis not present

## 2015-07-09 DIAGNOSIS — R251 Tremor, unspecified: Secondary | ICD-10-CM | POA: Diagnosis not present

## 2015-07-09 DIAGNOSIS — N390 Urinary tract infection, site not specified: Secondary | ICD-10-CM | POA: Diagnosis not present

## 2015-07-10 DIAGNOSIS — N390 Urinary tract infection, site not specified: Secondary | ICD-10-CM | POA: Diagnosis not present

## 2015-07-10 DIAGNOSIS — R251 Tremor, unspecified: Secondary | ICD-10-CM | POA: Diagnosis not present

## 2015-07-10 DIAGNOSIS — F0390 Unspecified dementia without behavioral disturbance: Secondary | ICD-10-CM | POA: Diagnosis not present

## 2015-07-10 DIAGNOSIS — Z792 Long term (current) use of antibiotics: Secondary | ICD-10-CM | POA: Diagnosis not present

## 2015-07-11 DIAGNOSIS — F0390 Unspecified dementia without behavioral disturbance: Secondary | ICD-10-CM | POA: Diagnosis not present

## 2015-07-11 DIAGNOSIS — Z792 Long term (current) use of antibiotics: Secondary | ICD-10-CM | POA: Diagnosis not present

## 2015-07-11 DIAGNOSIS — N390 Urinary tract infection, site not specified: Secondary | ICD-10-CM | POA: Diagnosis not present

## 2015-07-11 DIAGNOSIS — R251 Tremor, unspecified: Secondary | ICD-10-CM | POA: Diagnosis not present

## 2015-07-14 ENCOUNTER — Telehealth: Payer: Self-pay

## 2015-07-14 DIAGNOSIS — Z792 Long term (current) use of antibiotics: Secondary | ICD-10-CM | POA: Diagnosis not present

## 2015-07-14 DIAGNOSIS — N390 Urinary tract infection, site not specified: Secondary | ICD-10-CM | POA: Diagnosis not present

## 2015-07-14 DIAGNOSIS — R251 Tremor, unspecified: Secondary | ICD-10-CM | POA: Diagnosis not present

## 2015-07-14 DIAGNOSIS — F0390 Unspecified dementia without behavioral disturbance: Secondary | ICD-10-CM | POA: Diagnosis not present

## 2015-07-14 NOTE — Telephone Encounter (Signed)
I don't know why she has this problem. She will need to be seen. If no one is available in the office, then she may need to go to an Urgent [place].

## 2015-07-14 NOTE — Telephone Encounter (Signed)
Patients daughter aware

## 2015-07-14 NOTE — Telephone Encounter (Signed)
Patient with swollen lymph nodes under ears and in neck area, left side is worse. Symptoms onset last night. ? If this is a delayed reaction liquid antibiotic, patient completed on Wednesday. Patient denies sore throat, fever, sinus pressure, tenderness in the area of concern or pain.  Please advise

## 2015-07-19 DIAGNOSIS — Z792 Long term (current) use of antibiotics: Secondary | ICD-10-CM | POA: Diagnosis not present

## 2015-07-19 DIAGNOSIS — F0390 Unspecified dementia without behavioral disturbance: Secondary | ICD-10-CM | POA: Diagnosis not present

## 2015-07-19 DIAGNOSIS — N390 Urinary tract infection, site not specified: Secondary | ICD-10-CM | POA: Diagnosis not present

## 2015-07-19 DIAGNOSIS — R251 Tremor, unspecified: Secondary | ICD-10-CM | POA: Diagnosis not present

## 2015-07-20 ENCOUNTER — Telehealth: Payer: Self-pay

## 2015-07-20 NOTE — Telephone Encounter (Signed)
FL@ was received via fax. FL2 was already completed and only requires a signature. Last 2 OV notes printed and attached with FL2. Form placed in Dr.Green's review and sign folder

## 2015-07-25 ENCOUNTER — Encounter: Payer: Self-pay | Admitting: Internal Medicine

## 2015-07-26 DIAGNOSIS — R251 Tremor, unspecified: Secondary | ICD-10-CM | POA: Diagnosis not present

## 2015-07-26 DIAGNOSIS — N390 Urinary tract infection, site not specified: Secondary | ICD-10-CM | POA: Diagnosis not present

## 2015-07-26 DIAGNOSIS — F0390 Unspecified dementia without behavioral disturbance: Secondary | ICD-10-CM | POA: Diagnosis not present

## 2015-07-26 DIAGNOSIS — Z792 Long term (current) use of antibiotics: Secondary | ICD-10-CM | POA: Diagnosis not present

## 2015-07-27 DIAGNOSIS — R296 Repeated falls: Secondary | ICD-10-CM | POA: Diagnosis not present

## 2015-07-27 DIAGNOSIS — M6281 Muscle weakness (generalized): Secondary | ICD-10-CM | POA: Diagnosis not present

## 2015-07-27 DIAGNOSIS — R262 Difficulty in walking, not elsewhere classified: Secondary | ICD-10-CM | POA: Diagnosis not present

## 2015-07-27 DIAGNOSIS — R278 Other lack of coordination: Secondary | ICD-10-CM | POA: Diagnosis not present

## 2015-07-27 DIAGNOSIS — Z9181 History of falling: Secondary | ICD-10-CM | POA: Diagnosis not present

## 2015-07-27 DIAGNOSIS — R488 Other symbolic dysfunctions: Secondary | ICD-10-CM | POA: Diagnosis not present

## 2015-07-28 DIAGNOSIS — Z9181 History of falling: Secondary | ICD-10-CM | POA: Diagnosis not present

## 2015-07-28 DIAGNOSIS — R296 Repeated falls: Secondary | ICD-10-CM | POA: Diagnosis not present

## 2015-07-28 DIAGNOSIS — R262 Difficulty in walking, not elsewhere classified: Secondary | ICD-10-CM | POA: Diagnosis not present

## 2015-07-28 DIAGNOSIS — R278 Other lack of coordination: Secondary | ICD-10-CM | POA: Diagnosis not present

## 2015-07-28 DIAGNOSIS — R488 Other symbolic dysfunctions: Secondary | ICD-10-CM | POA: Diagnosis not present

## 2015-07-28 DIAGNOSIS — M6281 Muscle weakness (generalized): Secondary | ICD-10-CM | POA: Diagnosis not present

## 2015-07-31 DIAGNOSIS — R488 Other symbolic dysfunctions: Secondary | ICD-10-CM | POA: Diagnosis not present

## 2015-07-31 DIAGNOSIS — M6281 Muscle weakness (generalized): Secondary | ICD-10-CM | POA: Diagnosis not present

## 2015-07-31 DIAGNOSIS — R296 Repeated falls: Secondary | ICD-10-CM | POA: Diagnosis not present

## 2015-07-31 DIAGNOSIS — R262 Difficulty in walking, not elsewhere classified: Secondary | ICD-10-CM | POA: Diagnosis not present

## 2015-07-31 DIAGNOSIS — R278 Other lack of coordination: Secondary | ICD-10-CM | POA: Diagnosis not present

## 2015-07-31 DIAGNOSIS — Z9181 History of falling: Secondary | ICD-10-CM | POA: Diagnosis not present

## 2015-08-01 DIAGNOSIS — Z9181 History of falling: Secondary | ICD-10-CM | POA: Diagnosis not present

## 2015-08-01 DIAGNOSIS — R262 Difficulty in walking, not elsewhere classified: Secondary | ICD-10-CM | POA: Diagnosis not present

## 2015-08-01 DIAGNOSIS — R296 Repeated falls: Secondary | ICD-10-CM | POA: Diagnosis not present

## 2015-08-01 DIAGNOSIS — M6281 Muscle weakness (generalized): Secondary | ICD-10-CM | POA: Diagnosis not present

## 2015-08-01 DIAGNOSIS — R278 Other lack of coordination: Secondary | ICD-10-CM | POA: Diagnosis not present

## 2015-08-01 DIAGNOSIS — R488 Other symbolic dysfunctions: Secondary | ICD-10-CM | POA: Diagnosis not present

## 2015-08-02 DIAGNOSIS — R488 Other symbolic dysfunctions: Secondary | ICD-10-CM | POA: Diagnosis not present

## 2015-08-02 DIAGNOSIS — R278 Other lack of coordination: Secondary | ICD-10-CM | POA: Diagnosis not present

## 2015-08-02 DIAGNOSIS — M6281 Muscle weakness (generalized): Secondary | ICD-10-CM | POA: Diagnosis not present

## 2015-08-02 DIAGNOSIS — R262 Difficulty in walking, not elsewhere classified: Secondary | ICD-10-CM | POA: Diagnosis not present

## 2015-08-02 DIAGNOSIS — R296 Repeated falls: Secondary | ICD-10-CM | POA: Diagnosis not present

## 2015-08-02 DIAGNOSIS — Z9181 History of falling: Secondary | ICD-10-CM | POA: Diagnosis not present

## 2015-08-03 DIAGNOSIS — R296 Repeated falls: Secondary | ICD-10-CM | POA: Diagnosis not present

## 2015-08-03 DIAGNOSIS — R488 Other symbolic dysfunctions: Secondary | ICD-10-CM | POA: Diagnosis not present

## 2015-08-03 DIAGNOSIS — M6281 Muscle weakness (generalized): Secondary | ICD-10-CM | POA: Diagnosis not present

## 2015-08-03 DIAGNOSIS — R262 Difficulty in walking, not elsewhere classified: Secondary | ICD-10-CM | POA: Diagnosis not present

## 2015-08-03 DIAGNOSIS — Z9181 History of falling: Secondary | ICD-10-CM | POA: Diagnosis not present

## 2015-08-03 DIAGNOSIS — R278 Other lack of coordination: Secondary | ICD-10-CM | POA: Diagnosis not present

## 2015-08-04 DIAGNOSIS — R296 Repeated falls: Secondary | ICD-10-CM | POA: Diagnosis not present

## 2015-08-04 DIAGNOSIS — R262 Difficulty in walking, not elsewhere classified: Secondary | ICD-10-CM | POA: Diagnosis not present

## 2015-08-04 DIAGNOSIS — R488 Other symbolic dysfunctions: Secondary | ICD-10-CM | POA: Diagnosis not present

## 2015-08-04 DIAGNOSIS — M6281 Muscle weakness (generalized): Secondary | ICD-10-CM | POA: Diagnosis not present

## 2015-08-04 DIAGNOSIS — R278 Other lack of coordination: Secondary | ICD-10-CM | POA: Diagnosis not present

## 2015-08-04 DIAGNOSIS — Z9181 History of falling: Secondary | ICD-10-CM | POA: Diagnosis not present

## 2015-08-07 DIAGNOSIS — Z9181 History of falling: Secondary | ICD-10-CM | POA: Diagnosis not present

## 2015-08-07 DIAGNOSIS — R278 Other lack of coordination: Secondary | ICD-10-CM | POA: Diagnosis not present

## 2015-08-07 DIAGNOSIS — M6281 Muscle weakness (generalized): Secondary | ICD-10-CM | POA: Diagnosis not present

## 2015-08-07 DIAGNOSIS — R262 Difficulty in walking, not elsewhere classified: Secondary | ICD-10-CM | POA: Diagnosis not present

## 2015-08-07 DIAGNOSIS — R296 Repeated falls: Secondary | ICD-10-CM | POA: Diagnosis not present

## 2015-08-07 DIAGNOSIS — R488 Other symbolic dysfunctions: Secondary | ICD-10-CM | POA: Diagnosis not present

## 2015-08-08 DIAGNOSIS — R488 Other symbolic dysfunctions: Secondary | ICD-10-CM | POA: Diagnosis not present

## 2015-08-08 DIAGNOSIS — M6281 Muscle weakness (generalized): Secondary | ICD-10-CM | POA: Diagnosis not present

## 2015-08-08 DIAGNOSIS — R278 Other lack of coordination: Secondary | ICD-10-CM | POA: Diagnosis not present

## 2015-08-08 DIAGNOSIS — Z9181 History of falling: Secondary | ICD-10-CM | POA: Diagnosis not present

## 2015-08-08 DIAGNOSIS — R296 Repeated falls: Secondary | ICD-10-CM | POA: Diagnosis not present

## 2015-08-08 DIAGNOSIS — R262 Difficulty in walking, not elsewhere classified: Secondary | ICD-10-CM | POA: Diagnosis not present

## 2015-08-09 DIAGNOSIS — R488 Other symbolic dysfunctions: Secondary | ICD-10-CM | POA: Diagnosis not present

## 2015-08-09 DIAGNOSIS — Z9181 History of falling: Secondary | ICD-10-CM | POA: Diagnosis not present

## 2015-08-09 DIAGNOSIS — M6281 Muscle weakness (generalized): Secondary | ICD-10-CM | POA: Diagnosis not present

## 2015-08-09 DIAGNOSIS — R262 Difficulty in walking, not elsewhere classified: Secondary | ICD-10-CM | POA: Diagnosis not present

## 2015-08-09 DIAGNOSIS — R296 Repeated falls: Secondary | ICD-10-CM | POA: Diagnosis not present

## 2015-08-09 DIAGNOSIS — R278 Other lack of coordination: Secondary | ICD-10-CM | POA: Diagnosis not present

## 2015-08-10 DIAGNOSIS — R278 Other lack of coordination: Secondary | ICD-10-CM | POA: Diagnosis not present

## 2015-08-10 DIAGNOSIS — R488 Other symbolic dysfunctions: Secondary | ICD-10-CM | POA: Diagnosis not present

## 2015-08-10 DIAGNOSIS — R262 Difficulty in walking, not elsewhere classified: Secondary | ICD-10-CM | POA: Diagnosis not present

## 2015-08-10 DIAGNOSIS — M6281 Muscle weakness (generalized): Secondary | ICD-10-CM | POA: Diagnosis not present

## 2015-08-10 DIAGNOSIS — R296 Repeated falls: Secondary | ICD-10-CM | POA: Diagnosis not present

## 2015-08-10 DIAGNOSIS — Z9181 History of falling: Secondary | ICD-10-CM | POA: Diagnosis not present

## 2015-08-11 DIAGNOSIS — R488 Other symbolic dysfunctions: Secondary | ICD-10-CM | POA: Diagnosis not present

## 2015-08-11 DIAGNOSIS — R278 Other lack of coordination: Secondary | ICD-10-CM | POA: Diagnosis not present

## 2015-08-11 DIAGNOSIS — M6281 Muscle weakness (generalized): Secondary | ICD-10-CM | POA: Diagnosis not present

## 2015-08-11 DIAGNOSIS — Z9181 History of falling: Secondary | ICD-10-CM | POA: Diagnosis not present

## 2015-08-11 DIAGNOSIS — R296 Repeated falls: Secondary | ICD-10-CM | POA: Diagnosis not present

## 2015-08-11 DIAGNOSIS — R262 Difficulty in walking, not elsewhere classified: Secondary | ICD-10-CM | POA: Diagnosis not present

## 2015-08-14 ENCOUNTER — Encounter: Payer: Self-pay | Admitting: Internal Medicine

## 2015-08-14 DIAGNOSIS — R278 Other lack of coordination: Secondary | ICD-10-CM | POA: Diagnosis not present

## 2015-08-14 DIAGNOSIS — Z9181 History of falling: Secondary | ICD-10-CM | POA: Diagnosis not present

## 2015-08-14 DIAGNOSIS — R488 Other symbolic dysfunctions: Secondary | ICD-10-CM | POA: Diagnosis not present

## 2015-08-14 DIAGNOSIS — M6281 Muscle weakness (generalized): Secondary | ICD-10-CM | POA: Diagnosis not present

## 2015-08-14 DIAGNOSIS — R296 Repeated falls: Secondary | ICD-10-CM | POA: Diagnosis not present

## 2015-08-14 DIAGNOSIS — R262 Difficulty in walking, not elsewhere classified: Secondary | ICD-10-CM | POA: Diagnosis not present

## 2015-08-14 DIAGNOSIS — N390 Urinary tract infection, site not specified: Secondary | ICD-10-CM | POA: Diagnosis not present

## 2015-08-15 DIAGNOSIS — R262 Difficulty in walking, not elsewhere classified: Secondary | ICD-10-CM | POA: Diagnosis not present

## 2015-08-15 DIAGNOSIS — Z9181 History of falling: Secondary | ICD-10-CM | POA: Diagnosis not present

## 2015-08-15 DIAGNOSIS — R278 Other lack of coordination: Secondary | ICD-10-CM | POA: Diagnosis not present

## 2015-08-15 DIAGNOSIS — R488 Other symbolic dysfunctions: Secondary | ICD-10-CM | POA: Diagnosis not present

## 2015-08-15 DIAGNOSIS — M6281 Muscle weakness (generalized): Secondary | ICD-10-CM | POA: Diagnosis not present

## 2015-08-15 DIAGNOSIS — R296 Repeated falls: Secondary | ICD-10-CM | POA: Diagnosis not present

## 2015-08-16 DIAGNOSIS — R262 Difficulty in walking, not elsewhere classified: Secondary | ICD-10-CM | POA: Diagnosis not present

## 2015-08-16 DIAGNOSIS — R278 Other lack of coordination: Secondary | ICD-10-CM | POA: Diagnosis not present

## 2015-08-16 DIAGNOSIS — R296 Repeated falls: Secondary | ICD-10-CM | POA: Diagnosis not present

## 2015-08-16 DIAGNOSIS — M6281 Muscle weakness (generalized): Secondary | ICD-10-CM | POA: Diagnosis not present

## 2015-08-16 DIAGNOSIS — Z9181 History of falling: Secondary | ICD-10-CM | POA: Diagnosis not present

## 2015-08-16 DIAGNOSIS — R488 Other symbolic dysfunctions: Secondary | ICD-10-CM | POA: Diagnosis not present

## 2015-08-17 DIAGNOSIS — R278 Other lack of coordination: Secondary | ICD-10-CM | POA: Diagnosis not present

## 2015-08-17 DIAGNOSIS — M6281 Muscle weakness (generalized): Secondary | ICD-10-CM | POA: Diagnosis not present

## 2015-08-17 DIAGNOSIS — R296 Repeated falls: Secondary | ICD-10-CM | POA: Diagnosis not present

## 2015-08-17 DIAGNOSIS — Z9181 History of falling: Secondary | ICD-10-CM | POA: Diagnosis not present

## 2015-08-17 DIAGNOSIS — R262 Difficulty in walking, not elsewhere classified: Secondary | ICD-10-CM | POA: Diagnosis not present

## 2015-08-17 DIAGNOSIS — R488 Other symbolic dysfunctions: Secondary | ICD-10-CM | POA: Diagnosis not present

## 2015-08-18 DIAGNOSIS — R488 Other symbolic dysfunctions: Secondary | ICD-10-CM | POA: Diagnosis not present

## 2015-08-18 DIAGNOSIS — M6281 Muscle weakness (generalized): Secondary | ICD-10-CM | POA: Diagnosis not present

## 2015-08-18 DIAGNOSIS — R296 Repeated falls: Secondary | ICD-10-CM | POA: Diagnosis not present

## 2015-08-18 DIAGNOSIS — R278 Other lack of coordination: Secondary | ICD-10-CM | POA: Diagnosis not present

## 2015-08-18 DIAGNOSIS — R262 Difficulty in walking, not elsewhere classified: Secondary | ICD-10-CM | POA: Diagnosis not present

## 2015-08-18 DIAGNOSIS — Z9181 History of falling: Secondary | ICD-10-CM | POA: Diagnosis not present

## 2015-08-21 DIAGNOSIS — M6281 Muscle weakness (generalized): Secondary | ICD-10-CM | POA: Diagnosis not present

## 2015-08-21 DIAGNOSIS — R488 Other symbolic dysfunctions: Secondary | ICD-10-CM | POA: Diagnosis not present

## 2015-08-21 DIAGNOSIS — R262 Difficulty in walking, not elsewhere classified: Secondary | ICD-10-CM | POA: Diagnosis not present

## 2015-08-21 DIAGNOSIS — R278 Other lack of coordination: Secondary | ICD-10-CM | POA: Diagnosis not present

## 2015-08-21 DIAGNOSIS — R296 Repeated falls: Secondary | ICD-10-CM | POA: Diagnosis not present

## 2015-08-21 DIAGNOSIS — Z9181 History of falling: Secondary | ICD-10-CM | POA: Diagnosis not present

## 2015-08-22 DIAGNOSIS — R278 Other lack of coordination: Secondary | ICD-10-CM | POA: Diagnosis not present

## 2015-08-22 DIAGNOSIS — R296 Repeated falls: Secondary | ICD-10-CM | POA: Diagnosis not present

## 2015-08-22 DIAGNOSIS — R262 Difficulty in walking, not elsewhere classified: Secondary | ICD-10-CM | POA: Diagnosis not present

## 2015-08-22 DIAGNOSIS — R488 Other symbolic dysfunctions: Secondary | ICD-10-CM | POA: Diagnosis not present

## 2015-08-22 DIAGNOSIS — Z9181 History of falling: Secondary | ICD-10-CM | POA: Diagnosis not present

## 2015-08-22 DIAGNOSIS — M6281 Muscle weakness (generalized): Secondary | ICD-10-CM | POA: Diagnosis not present

## 2015-08-23 DIAGNOSIS — R296 Repeated falls: Secondary | ICD-10-CM | POA: Diagnosis not present

## 2015-08-23 DIAGNOSIS — R488 Other symbolic dysfunctions: Secondary | ICD-10-CM | POA: Diagnosis not present

## 2015-08-23 DIAGNOSIS — R262 Difficulty in walking, not elsewhere classified: Secondary | ICD-10-CM | POA: Diagnosis not present

## 2015-08-23 DIAGNOSIS — Z9181 History of falling: Secondary | ICD-10-CM | POA: Diagnosis not present

## 2015-08-23 DIAGNOSIS — R278 Other lack of coordination: Secondary | ICD-10-CM | POA: Diagnosis not present

## 2015-08-23 DIAGNOSIS — M6281 Muscle weakness (generalized): Secondary | ICD-10-CM | POA: Diagnosis not present

## 2015-08-24 DIAGNOSIS — R278 Other lack of coordination: Secondary | ICD-10-CM | POA: Diagnosis not present

## 2015-08-24 DIAGNOSIS — R488 Other symbolic dysfunctions: Secondary | ICD-10-CM | POA: Diagnosis not present

## 2015-08-24 DIAGNOSIS — M6281 Muscle weakness (generalized): Secondary | ICD-10-CM | POA: Diagnosis not present

## 2015-08-24 DIAGNOSIS — R262 Difficulty in walking, not elsewhere classified: Secondary | ICD-10-CM | POA: Diagnosis not present

## 2015-08-24 DIAGNOSIS — Z9181 History of falling: Secondary | ICD-10-CM | POA: Diagnosis not present

## 2015-08-24 DIAGNOSIS — R296 Repeated falls: Secondary | ICD-10-CM | POA: Diagnosis not present

## 2015-08-25 DIAGNOSIS — M6281 Muscle weakness (generalized): Secondary | ICD-10-CM | POA: Diagnosis not present

## 2015-08-25 DIAGNOSIS — R278 Other lack of coordination: Secondary | ICD-10-CM | POA: Diagnosis not present

## 2015-08-25 DIAGNOSIS — Z9181 History of falling: Secondary | ICD-10-CM | POA: Diagnosis not present

## 2015-08-25 DIAGNOSIS — R262 Difficulty in walking, not elsewhere classified: Secondary | ICD-10-CM | POA: Diagnosis not present

## 2015-08-25 DIAGNOSIS — R296 Repeated falls: Secondary | ICD-10-CM | POA: Diagnosis not present

## 2015-08-25 DIAGNOSIS — R488 Other symbolic dysfunctions: Secondary | ICD-10-CM | POA: Diagnosis not present

## 2015-08-29 DIAGNOSIS — R278 Other lack of coordination: Secondary | ICD-10-CM | POA: Diagnosis not present

## 2015-08-29 DIAGNOSIS — Z9181 History of falling: Secondary | ICD-10-CM | POA: Diagnosis not present

## 2015-08-29 DIAGNOSIS — R262 Difficulty in walking, not elsewhere classified: Secondary | ICD-10-CM | POA: Diagnosis not present

## 2015-08-29 DIAGNOSIS — R296 Repeated falls: Secondary | ICD-10-CM | POA: Diagnosis not present

## 2015-08-29 DIAGNOSIS — M6281 Muscle weakness (generalized): Secondary | ICD-10-CM | POA: Diagnosis not present

## 2015-08-29 DIAGNOSIS — R488 Other symbolic dysfunctions: Secondary | ICD-10-CM | POA: Diagnosis not present

## 2015-08-30 DIAGNOSIS — Z9181 History of falling: Secondary | ICD-10-CM | POA: Diagnosis not present

## 2015-08-30 DIAGNOSIS — M6281 Muscle weakness (generalized): Secondary | ICD-10-CM | POA: Diagnosis not present

## 2015-08-30 DIAGNOSIS — R278 Other lack of coordination: Secondary | ICD-10-CM | POA: Diagnosis not present

## 2015-08-30 DIAGNOSIS — R296 Repeated falls: Secondary | ICD-10-CM | POA: Diagnosis not present

## 2015-08-30 DIAGNOSIS — R262 Difficulty in walking, not elsewhere classified: Secondary | ICD-10-CM | POA: Diagnosis not present

## 2015-08-30 DIAGNOSIS — R488 Other symbolic dysfunctions: Secondary | ICD-10-CM | POA: Diagnosis not present

## 2015-08-31 DIAGNOSIS — R296 Repeated falls: Secondary | ICD-10-CM | POA: Diagnosis not present

## 2015-08-31 DIAGNOSIS — M6281 Muscle weakness (generalized): Secondary | ICD-10-CM | POA: Diagnosis not present

## 2015-08-31 DIAGNOSIS — R262 Difficulty in walking, not elsewhere classified: Secondary | ICD-10-CM | POA: Diagnosis not present

## 2015-08-31 DIAGNOSIS — R278 Other lack of coordination: Secondary | ICD-10-CM | POA: Diagnosis not present

## 2015-08-31 DIAGNOSIS — R488 Other symbolic dysfunctions: Secondary | ICD-10-CM | POA: Diagnosis not present

## 2015-08-31 DIAGNOSIS — Z9181 History of falling: Secondary | ICD-10-CM | POA: Diagnosis not present

## 2015-09-01 DIAGNOSIS — Z9181 History of falling: Secondary | ICD-10-CM | POA: Diagnosis not present

## 2015-09-01 DIAGNOSIS — R488 Other symbolic dysfunctions: Secondary | ICD-10-CM | POA: Diagnosis not present

## 2015-09-01 DIAGNOSIS — R296 Repeated falls: Secondary | ICD-10-CM | POA: Diagnosis not present

## 2015-09-01 DIAGNOSIS — M6281 Muscle weakness (generalized): Secondary | ICD-10-CM | POA: Diagnosis not present

## 2015-09-01 DIAGNOSIS — R262 Difficulty in walking, not elsewhere classified: Secondary | ICD-10-CM | POA: Diagnosis not present

## 2015-09-01 DIAGNOSIS — R278 Other lack of coordination: Secondary | ICD-10-CM | POA: Diagnosis not present

## 2015-09-04 DIAGNOSIS — R488 Other symbolic dysfunctions: Secondary | ICD-10-CM | POA: Diagnosis not present

## 2015-09-04 DIAGNOSIS — Z9181 History of falling: Secondary | ICD-10-CM | POA: Diagnosis not present

## 2015-09-04 DIAGNOSIS — R296 Repeated falls: Secondary | ICD-10-CM | POA: Diagnosis not present

## 2015-09-04 DIAGNOSIS — M6281 Muscle weakness (generalized): Secondary | ICD-10-CM | POA: Diagnosis not present

## 2015-09-04 DIAGNOSIS — R262 Difficulty in walking, not elsewhere classified: Secondary | ICD-10-CM | POA: Diagnosis not present

## 2015-09-04 DIAGNOSIS — R278 Other lack of coordination: Secondary | ICD-10-CM | POA: Diagnosis not present

## 2015-09-05 DIAGNOSIS — Z9181 History of falling: Secondary | ICD-10-CM | POA: Diagnosis not present

## 2015-09-05 DIAGNOSIS — M6281 Muscle weakness (generalized): Secondary | ICD-10-CM | POA: Diagnosis not present

## 2015-09-05 DIAGNOSIS — R296 Repeated falls: Secondary | ICD-10-CM | POA: Diagnosis not present

## 2015-09-05 DIAGNOSIS — R278 Other lack of coordination: Secondary | ICD-10-CM | POA: Diagnosis not present

## 2015-09-05 DIAGNOSIS — R488 Other symbolic dysfunctions: Secondary | ICD-10-CM | POA: Diagnosis not present

## 2015-09-05 DIAGNOSIS — R262 Difficulty in walking, not elsewhere classified: Secondary | ICD-10-CM | POA: Diagnosis not present

## 2015-09-06 DIAGNOSIS — R278 Other lack of coordination: Secondary | ICD-10-CM | POA: Diagnosis not present

## 2015-09-06 DIAGNOSIS — R296 Repeated falls: Secondary | ICD-10-CM | POA: Diagnosis not present

## 2015-09-06 DIAGNOSIS — Z9181 History of falling: Secondary | ICD-10-CM | POA: Diagnosis not present

## 2015-09-06 DIAGNOSIS — R262 Difficulty in walking, not elsewhere classified: Secondary | ICD-10-CM | POA: Diagnosis not present

## 2015-09-06 DIAGNOSIS — R488 Other symbolic dysfunctions: Secondary | ICD-10-CM | POA: Diagnosis not present

## 2015-09-06 DIAGNOSIS — M6281 Muscle weakness (generalized): Secondary | ICD-10-CM | POA: Diagnosis not present

## 2015-09-07 DIAGNOSIS — R296 Repeated falls: Secondary | ICD-10-CM | POA: Diagnosis not present

## 2015-09-07 DIAGNOSIS — R278 Other lack of coordination: Secondary | ICD-10-CM | POA: Diagnosis not present

## 2015-09-07 DIAGNOSIS — M6281 Muscle weakness (generalized): Secondary | ICD-10-CM | POA: Diagnosis not present

## 2015-09-07 DIAGNOSIS — R488 Other symbolic dysfunctions: Secondary | ICD-10-CM | POA: Diagnosis not present

## 2015-09-07 DIAGNOSIS — R262 Difficulty in walking, not elsewhere classified: Secondary | ICD-10-CM | POA: Diagnosis not present

## 2015-09-07 DIAGNOSIS — Z9181 History of falling: Secondary | ICD-10-CM | POA: Diagnosis not present

## 2015-09-12 ENCOUNTER — Ambulatory Visit (INDEPENDENT_AMBULATORY_CARE_PROVIDER_SITE_OTHER): Payer: Medicare Other | Admitting: Internal Medicine

## 2015-09-12 ENCOUNTER — Encounter: Payer: Self-pay | Admitting: Internal Medicine

## 2015-09-12 VITALS — BP 114/60 | HR 69 | Temp 98.0°F | Ht 63.0 in | Wt 126.0 lb

## 2015-09-12 DIAGNOSIS — R269 Unspecified abnormalities of gait and mobility: Secondary | ICD-10-CM | POA: Diagnosis not present

## 2015-09-12 DIAGNOSIS — R443 Hallucinations, unspecified: Secondary | ICD-10-CM

## 2015-09-12 DIAGNOSIS — R413 Other amnesia: Secondary | ICD-10-CM | POA: Diagnosis not present

## 2015-09-12 DIAGNOSIS — M6281 Muscle weakness (generalized): Secondary | ICD-10-CM | POA: Diagnosis not present

## 2015-09-12 DIAGNOSIS — Z9181 History of falling: Secondary | ICD-10-CM

## 2015-09-12 DIAGNOSIS — R278 Other lack of coordination: Secondary | ICD-10-CM | POA: Diagnosis not present

## 2015-09-12 DIAGNOSIS — R262 Difficulty in walking, not elsewhere classified: Secondary | ICD-10-CM | POA: Diagnosis not present

## 2015-09-12 DIAGNOSIS — R251 Tremor, unspecified: Secondary | ICD-10-CM

## 2015-09-12 HISTORY — DX: History of falling: Z91.81

## 2015-09-12 NOTE — Progress Notes (Addendum)
Facility  [facility]    Place of Service:   OFFICE    No Known Allergies  Chief Complaint  Patient presents with  . Medical Management of Chronic Issues    4 month medication management, memory, hallucinations.Memory has gotten worse. Here with daughter [name]   . Gait Problem    balance off, has fallen several times 3-5 times. Using walker.     HPI:   Memory deficit - Patient and daughter feel like that has been some progression in her memory deficits. She has now been moved from independent living into the memory care unit at [facility]. Patient was found wandering on the grounds and having increasing confusion identifying her apartment. She is needing assistance with dressing. She continues to feed herself and use the bathroom unattended.  Hallucinations - no recent issues with this  Abnormality of gait - I feel like this is most likely associated with total brain decline. She does not want to take medications for her memory. She has had multiple falls. She generally uses a walker.  History of fall - while patient was wandering. Similar falls associated with a fragile weakened state. She uses a walker generally try to help prevent further falls. She is not on any medications.  Tremor - bilateral tremor maybe a little bit worse. It seems most consistent with benign essential tremor. She does not want to take medication for this. She is having some difficulty getting cups safely to her mouth without spillage.    Medications: Patient's Medications  New Prescriptions   No medications on file  Previous Medications   No medications on file  Modified Medications   No medications on file  Discontinued Medications   NAPROXEN SODIUM (ALEVE) 220 MG TABLET    Take one tablet by mouth up to three times daily as needed for pain    Review of Systems  Constitutional: Negative for activity change and appetite change.  Respiratory: Negative for cough and shortness of breath.     Cardiovascular: Positive for leg swelling. Negative for chest pain and palpitations.  Gastrointestinal: Negative for abdominal distention and abdominal pain.  Genitourinary: Negative for dysuria.  Musculoskeletal: Positive for joint swelling (left ankle). Negative for arthralgias and gait problem.    Vitals:   09/12/15 1554  BP: 114/60  Pulse: 69  Temp: 98 F (36.7 C)  TempSrc: Oral  SpO2: 97%  Weight: 126 lb (57.2 kg)  Height: 5' 3"  (1.6 m)   Body mass index is 22.32 kg/m. Wt Readings from Last 3 Encounters:  09/12/15 126 lb (57.2 kg)  06/26/15 130 lb 3.2 oz (59.1 kg)  02/08/15 139 lb 9.6 oz (63.3 kg)      Physical Exam  Constitutional: She is oriented to person, place, and time. She appears well-developed and well-nourished. No distress.  HENT:  Head: Normocephalic and atraumatic.  Right Ear: External ear normal.  Left Ear: External ear normal.  Nose: Nose normal.  Mouth/Throat: Oropharynx is clear and moist. No oropharyngeal exudate.  Bilateral loose cerumen. Not impacted. TM partially visualized bilaterally. Decreased hearing.  Eyes: Conjunctivae and EOM are normal. Pupils are equal, round, and reactive to light. Right eye exhibits no discharge. Left eye exhibits no discharge. No scleral icterus.  Neck: Normal range of motion. Neck supple. No JVD present. No tracheal deviation present. No thyromegaly present.  Cardiovascular: Normal rate, regular rhythm, normal heart sounds and intact distal pulses.  Exam reveals no gallop.   No murmur heard. Pulmonary/Chest: Effort normal  and breath sounds normal. No stridor. No respiratory distress. She has no wheezes. She has no rales.  Abdominal: Soft. Bowel sounds are normal. She exhibits no distension and no mass. There is no tenderness. There is no rebound and no guarding.  Genitourinary:  Genitourinary Comments: S/p hysterectomy.   Musculoskeletal: Normal range of motion. She exhibits edema (2+ pitting edema noted to left  ankle and foot, skin shiny). She exhibits no tenderness.  Unstable gait. Using cane and 4 wheel walker.  Lymphadenopathy:    She has no cervical adenopathy.  Neurological: She is alert and oriented to person, place, and time. She has normal reflexes. No cranial nerve deficit. Coordination normal.  Able to feel vibration at ankles, but not on toes. Bilateral tremor most consistent with BET. Significant memory deficit.  Skin: Skin is warm. No rash noted. She is not diaphoretic. No erythema. No pallor.  Psychiatric: She has a normal mood and affect. Her mood appears not anxious. Cognition and memory are impaired. She does not exhibit a depressed mood. She exhibits abnormal recent memory.  Hx hallucinations    Labs reviewed: Lab Summary Latest Ref Rng & Units 06/26/2015 08/16/2014 09/23/2013 11/14/2008 11/13/2008 11/12/2008  Hemoglobin 12.0 - 16.0 g/dL (None) (None) 14.1 12.2 11.9(L) 12.0  Hematocrit 36 - 46 % (None) (None) 42 35.7(L) 35.5(L) 35.5(L)  White count 10:3/mL (None) (None) 12.3 11.4(H) 11.1(H) 14.1(H)  Platelet count 150 - 399 K/L (None) (None) 270 157 152 151  Sodium 134 - 144 mmol/L 141 (None) 139 137 140 139  Potassium 3.5 - 5.2 mmol/L 3.6 (None) 4.3 3.8 3.8 3.8  Calcium 8.7 - 10.3 mg/dL 9.9 (None) (None) 9.4 9.5 9.5  Phosphorus - (None) (None) (None) (None) (None) (None)  Creatinine 0.57 - 1.00 mg/dL 0.88 (None) 0.9 0.94 0.81 0.95  AST 0 - 40 IU/L 20 (None) 17 30 24 27   Alk Phos 39 - 117 IU/L 71 (None) 71 72 68 60  Bilirubin 0.0 - 1.2 mg/dL 0.3 (None) (None) 0.6 0.9 0.6  Glucose 65 - 99 mg/dL 95 (None) 87 99 89 117(H)  Cholesterol 0 - 200 mg/dL (None) 291(A) 285(A) (None) (None) (None)  HDL cholesterol 35 - 70 mg/dL (None) 58 48 (None) (None) (None)  Triglycerides 40 - 160 mg/dL (None) 125 188(A) (None) (None) (None)  LDL Direct - (None) (None) (None) (None) (None) (None)  LDL Calc mg/dL (None) 208 199 (None) (None) (None)  Total protein 6.0 - 8.3 g/dL (None) (None) (None)  5.7(L) 5.5(L) 5.4(L)  Albumin 3.2 - 4.6 g/dL 3.8 (None) (None) 3.1(L) 3.1(L) 3.3(L)  Some recent data might be hidden   Lab Results  Component Value Date   TSH 2.91 09/23/2013   Lab Results  Component Value Date   BUN 22 06/26/2015   BUN 14 09/23/2013   BUN 15 11/14/2008   No results found for: HGBA1C  Assessment/Plan  1. Memory deficit MMSE next visit  2. Hallucinations Appears to be doing well without hallucinations at this time.  3. Abnormality of gait Leads to falls. Uses walker.  4. History of fall Fall risk is increased.  5. Tremor Recommended sippy cup or other similar device to allow her to get food to her mouth.

## 2015-09-12 NOTE — Patient Instructions (Signed)
Consider getting "The 36 Hour Day".  Be alert to changes in frequency of urination, fever, or foul odor of the urine as possible signs of infection, as well as dysuria.  Large handle utensils may be helpful due to the tremor.

## 2015-09-13 DIAGNOSIS — Z9181 History of falling: Secondary | ICD-10-CM | POA: Diagnosis not present

## 2015-09-13 DIAGNOSIS — R278 Other lack of coordination: Secondary | ICD-10-CM | POA: Diagnosis not present

## 2015-09-13 DIAGNOSIS — M6281 Muscle weakness (generalized): Secondary | ICD-10-CM | POA: Diagnosis not present

## 2015-09-13 DIAGNOSIS — R262 Difficulty in walking, not elsewhere classified: Secondary | ICD-10-CM | POA: Diagnosis not present

## 2015-09-14 DIAGNOSIS — Z9181 History of falling: Secondary | ICD-10-CM | POA: Diagnosis not present

## 2015-09-14 DIAGNOSIS — R262 Difficulty in walking, not elsewhere classified: Secondary | ICD-10-CM | POA: Diagnosis not present

## 2015-09-14 DIAGNOSIS — M6281 Muscle weakness (generalized): Secondary | ICD-10-CM | POA: Diagnosis not present

## 2015-09-14 DIAGNOSIS — R278 Other lack of coordination: Secondary | ICD-10-CM | POA: Diagnosis not present

## 2015-09-19 DIAGNOSIS — M6281 Muscle weakness (generalized): Secondary | ICD-10-CM | POA: Diagnosis not present

## 2015-09-19 DIAGNOSIS — R262 Difficulty in walking, not elsewhere classified: Secondary | ICD-10-CM | POA: Diagnosis not present

## 2015-09-19 DIAGNOSIS — R278 Other lack of coordination: Secondary | ICD-10-CM | POA: Diagnosis not present

## 2015-09-19 DIAGNOSIS — Z9181 History of falling: Secondary | ICD-10-CM | POA: Diagnosis not present

## 2015-09-20 DIAGNOSIS — R278 Other lack of coordination: Secondary | ICD-10-CM | POA: Diagnosis not present

## 2015-09-20 DIAGNOSIS — M6281 Muscle weakness (generalized): Secondary | ICD-10-CM | POA: Diagnosis not present

## 2015-09-20 DIAGNOSIS — Z9181 History of falling: Secondary | ICD-10-CM | POA: Diagnosis not present

## 2015-09-20 DIAGNOSIS — R262 Difficulty in walking, not elsewhere classified: Secondary | ICD-10-CM | POA: Diagnosis not present

## 2015-09-21 DIAGNOSIS — R278 Other lack of coordination: Secondary | ICD-10-CM | POA: Diagnosis not present

## 2015-09-21 DIAGNOSIS — Z9181 History of falling: Secondary | ICD-10-CM | POA: Diagnosis not present

## 2015-09-21 DIAGNOSIS — R262 Difficulty in walking, not elsewhere classified: Secondary | ICD-10-CM | POA: Diagnosis not present

## 2015-09-21 DIAGNOSIS — M6281 Muscle weakness (generalized): Secondary | ICD-10-CM | POA: Diagnosis not present

## 2015-09-25 DIAGNOSIS — R262 Difficulty in walking, not elsewhere classified: Secondary | ICD-10-CM | POA: Diagnosis not present

## 2015-09-25 DIAGNOSIS — M6281 Muscle weakness (generalized): Secondary | ICD-10-CM | POA: Diagnosis not present

## 2015-09-25 DIAGNOSIS — Z9181 History of falling: Secondary | ICD-10-CM | POA: Diagnosis not present

## 2015-09-25 DIAGNOSIS — R278 Other lack of coordination: Secondary | ICD-10-CM | POA: Diagnosis not present

## 2015-09-27 DIAGNOSIS — R278 Other lack of coordination: Secondary | ICD-10-CM | POA: Diagnosis not present

## 2015-09-27 DIAGNOSIS — M6281 Muscle weakness (generalized): Secondary | ICD-10-CM | POA: Diagnosis not present

## 2015-09-27 DIAGNOSIS — R262 Difficulty in walking, not elsewhere classified: Secondary | ICD-10-CM | POA: Diagnosis not present

## 2015-09-27 DIAGNOSIS — Z9181 History of falling: Secondary | ICD-10-CM | POA: Diagnosis not present

## 2015-09-28 DIAGNOSIS — R262 Difficulty in walking, not elsewhere classified: Secondary | ICD-10-CM | POA: Diagnosis not present

## 2015-09-28 DIAGNOSIS — M6281 Muscle weakness (generalized): Secondary | ICD-10-CM | POA: Diagnosis not present

## 2015-09-28 DIAGNOSIS — Z9181 History of falling: Secondary | ICD-10-CM | POA: Diagnosis not present

## 2015-09-28 DIAGNOSIS — R278 Other lack of coordination: Secondary | ICD-10-CM | POA: Diagnosis not present

## 2015-10-02 DIAGNOSIS — Z9181 History of falling: Secondary | ICD-10-CM | POA: Diagnosis not present

## 2015-10-02 DIAGNOSIS — R262 Difficulty in walking, not elsewhere classified: Secondary | ICD-10-CM | POA: Diagnosis not present

## 2015-10-02 DIAGNOSIS — M6281 Muscle weakness (generalized): Secondary | ICD-10-CM | POA: Diagnosis not present

## 2015-10-02 DIAGNOSIS — R278 Other lack of coordination: Secondary | ICD-10-CM | POA: Diagnosis not present

## 2015-10-03 DIAGNOSIS — Z9181 History of falling: Secondary | ICD-10-CM | POA: Diagnosis not present

## 2015-10-03 DIAGNOSIS — M6281 Muscle weakness (generalized): Secondary | ICD-10-CM | POA: Diagnosis not present

## 2015-10-03 DIAGNOSIS — R262 Difficulty in walking, not elsewhere classified: Secondary | ICD-10-CM | POA: Diagnosis not present

## 2015-10-03 DIAGNOSIS — R278 Other lack of coordination: Secondary | ICD-10-CM | POA: Diagnosis not present

## 2015-10-05 DIAGNOSIS — Z9181 History of falling: Secondary | ICD-10-CM | POA: Diagnosis not present

## 2015-10-05 DIAGNOSIS — R278 Other lack of coordination: Secondary | ICD-10-CM | POA: Diagnosis not present

## 2015-10-05 DIAGNOSIS — R262 Difficulty in walking, not elsewhere classified: Secondary | ICD-10-CM | POA: Diagnosis not present

## 2015-10-05 DIAGNOSIS — M6281 Muscle weakness (generalized): Secondary | ICD-10-CM | POA: Diagnosis not present

## 2015-10-10 DIAGNOSIS — R262 Difficulty in walking, not elsewhere classified: Secondary | ICD-10-CM | POA: Diagnosis not present

## 2015-10-10 DIAGNOSIS — M6281 Muscle weakness (generalized): Secondary | ICD-10-CM | POA: Diagnosis not present

## 2015-10-10 DIAGNOSIS — R278 Other lack of coordination: Secondary | ICD-10-CM | POA: Diagnosis not present

## 2015-10-10 DIAGNOSIS — Z9181 History of falling: Secondary | ICD-10-CM | POA: Diagnosis not present

## 2015-10-11 DIAGNOSIS — Z9181 History of falling: Secondary | ICD-10-CM | POA: Diagnosis not present

## 2015-10-11 DIAGNOSIS — R262 Difficulty in walking, not elsewhere classified: Secondary | ICD-10-CM | POA: Diagnosis not present

## 2015-10-11 DIAGNOSIS — M6281 Muscle weakness (generalized): Secondary | ICD-10-CM | POA: Diagnosis not present

## 2015-10-11 DIAGNOSIS — R278 Other lack of coordination: Secondary | ICD-10-CM | POA: Diagnosis not present

## 2015-10-12 DIAGNOSIS — M6281 Muscle weakness (generalized): Secondary | ICD-10-CM | POA: Diagnosis not present

## 2015-10-12 DIAGNOSIS — R278 Other lack of coordination: Secondary | ICD-10-CM | POA: Diagnosis not present

## 2015-10-12 DIAGNOSIS — Z9181 History of falling: Secondary | ICD-10-CM | POA: Diagnosis not present

## 2015-10-12 DIAGNOSIS — R262 Difficulty in walking, not elsewhere classified: Secondary | ICD-10-CM | POA: Diagnosis not present

## 2015-10-13 DIAGNOSIS — Z23 Encounter for immunization: Secondary | ICD-10-CM | POA: Diagnosis not present

## 2015-10-16 DIAGNOSIS — R278 Other lack of coordination: Secondary | ICD-10-CM | POA: Diagnosis not present

## 2015-10-16 DIAGNOSIS — M6281 Muscle weakness (generalized): Secondary | ICD-10-CM | POA: Diagnosis not present

## 2015-10-16 DIAGNOSIS — Z9181 History of falling: Secondary | ICD-10-CM | POA: Diagnosis not present

## 2015-10-16 DIAGNOSIS — R262 Difficulty in walking, not elsewhere classified: Secondary | ICD-10-CM | POA: Diagnosis not present

## 2015-10-18 DIAGNOSIS — R262 Difficulty in walking, not elsewhere classified: Secondary | ICD-10-CM | POA: Diagnosis not present

## 2015-10-18 DIAGNOSIS — Z9181 History of falling: Secondary | ICD-10-CM | POA: Diagnosis not present

## 2015-10-18 DIAGNOSIS — M6281 Muscle weakness (generalized): Secondary | ICD-10-CM | POA: Diagnosis not present

## 2015-10-18 DIAGNOSIS — R278 Other lack of coordination: Secondary | ICD-10-CM | POA: Diagnosis not present

## 2015-10-18 DIAGNOSIS — N390 Urinary tract infection, site not specified: Secondary | ICD-10-CM | POA: Diagnosis not present

## 2015-10-19 ENCOUNTER — Encounter: Payer: Self-pay | Admitting: Internal Medicine

## 2015-10-19 DIAGNOSIS — R278 Other lack of coordination: Secondary | ICD-10-CM | POA: Diagnosis not present

## 2015-10-19 DIAGNOSIS — Z9181 History of falling: Secondary | ICD-10-CM | POA: Diagnosis not present

## 2015-10-19 DIAGNOSIS — R262 Difficulty in walking, not elsewhere classified: Secondary | ICD-10-CM | POA: Diagnosis not present

## 2015-10-19 DIAGNOSIS — M6281 Muscle weakness (generalized): Secondary | ICD-10-CM | POA: Diagnosis not present

## 2015-10-23 DIAGNOSIS — Z9181 History of falling: Secondary | ICD-10-CM | POA: Diagnosis not present

## 2015-10-23 DIAGNOSIS — M6281 Muscle weakness (generalized): Secondary | ICD-10-CM | POA: Diagnosis not present

## 2015-10-23 DIAGNOSIS — R278 Other lack of coordination: Secondary | ICD-10-CM | POA: Diagnosis not present

## 2015-10-23 DIAGNOSIS — R262 Difficulty in walking, not elsewhere classified: Secondary | ICD-10-CM | POA: Diagnosis not present

## 2015-10-25 DIAGNOSIS — Z9181 History of falling: Secondary | ICD-10-CM | POA: Diagnosis not present

## 2015-10-25 DIAGNOSIS — R262 Difficulty in walking, not elsewhere classified: Secondary | ICD-10-CM | POA: Diagnosis not present

## 2015-10-25 DIAGNOSIS — M6281 Muscle weakness (generalized): Secondary | ICD-10-CM | POA: Diagnosis not present

## 2015-10-25 DIAGNOSIS — R278 Other lack of coordination: Secondary | ICD-10-CM | POA: Diagnosis not present

## 2015-10-26 DIAGNOSIS — M6281 Muscle weakness (generalized): Secondary | ICD-10-CM | POA: Diagnosis not present

## 2015-10-26 DIAGNOSIS — Z9181 History of falling: Secondary | ICD-10-CM | POA: Diagnosis not present

## 2015-10-26 DIAGNOSIS — R278 Other lack of coordination: Secondary | ICD-10-CM | POA: Diagnosis not present

## 2015-10-26 DIAGNOSIS — R262 Difficulty in walking, not elsewhere classified: Secondary | ICD-10-CM | POA: Diagnosis not present

## 2015-10-30 DIAGNOSIS — M6281 Muscle weakness (generalized): Secondary | ICD-10-CM | POA: Diagnosis not present

## 2015-10-30 DIAGNOSIS — R278 Other lack of coordination: Secondary | ICD-10-CM | POA: Diagnosis not present

## 2015-10-30 DIAGNOSIS — Z9181 History of falling: Secondary | ICD-10-CM | POA: Diagnosis not present

## 2015-10-30 DIAGNOSIS — R262 Difficulty in walking, not elsewhere classified: Secondary | ICD-10-CM | POA: Diagnosis not present

## 2015-11-01 DIAGNOSIS — R278 Other lack of coordination: Secondary | ICD-10-CM | POA: Diagnosis not present

## 2015-11-01 DIAGNOSIS — R262 Difficulty in walking, not elsewhere classified: Secondary | ICD-10-CM | POA: Diagnosis not present

## 2015-11-01 DIAGNOSIS — Z9181 History of falling: Secondary | ICD-10-CM | POA: Diagnosis not present

## 2015-11-01 DIAGNOSIS — M6281 Muscle weakness (generalized): Secondary | ICD-10-CM | POA: Diagnosis not present

## 2015-11-02 DIAGNOSIS — R278 Other lack of coordination: Secondary | ICD-10-CM | POA: Diagnosis not present

## 2015-11-02 DIAGNOSIS — M6281 Muscle weakness (generalized): Secondary | ICD-10-CM | POA: Diagnosis not present

## 2015-11-02 DIAGNOSIS — Z9181 History of falling: Secondary | ICD-10-CM | POA: Diagnosis not present

## 2015-11-02 DIAGNOSIS — R262 Difficulty in walking, not elsewhere classified: Secondary | ICD-10-CM | POA: Diagnosis not present

## 2015-11-06 DIAGNOSIS — R262 Difficulty in walking, not elsewhere classified: Secondary | ICD-10-CM | POA: Diagnosis not present

## 2015-11-06 DIAGNOSIS — M6281 Muscle weakness (generalized): Secondary | ICD-10-CM | POA: Diagnosis not present

## 2015-11-06 DIAGNOSIS — Z9181 History of falling: Secondary | ICD-10-CM | POA: Diagnosis not present

## 2015-11-06 DIAGNOSIS — R278 Other lack of coordination: Secondary | ICD-10-CM | POA: Diagnosis not present

## 2015-11-08 DIAGNOSIS — R262 Difficulty in walking, not elsewhere classified: Secondary | ICD-10-CM | POA: Diagnosis not present

## 2015-11-08 DIAGNOSIS — M6281 Muscle weakness (generalized): Secondary | ICD-10-CM | POA: Diagnosis not present

## 2015-11-08 DIAGNOSIS — R278 Other lack of coordination: Secondary | ICD-10-CM | POA: Diagnosis not present

## 2015-11-08 DIAGNOSIS — Z9181 History of falling: Secondary | ICD-10-CM | POA: Diagnosis not present

## 2015-11-09 DIAGNOSIS — M6281 Muscle weakness (generalized): Secondary | ICD-10-CM | POA: Diagnosis not present

## 2015-11-09 DIAGNOSIS — R262 Difficulty in walking, not elsewhere classified: Secondary | ICD-10-CM | POA: Diagnosis not present

## 2015-11-09 DIAGNOSIS — Z9181 History of falling: Secondary | ICD-10-CM | POA: Diagnosis not present

## 2015-11-09 DIAGNOSIS — R278 Other lack of coordination: Secondary | ICD-10-CM | POA: Diagnosis not present

## 2015-11-13 DIAGNOSIS — R262 Difficulty in walking, not elsewhere classified: Secondary | ICD-10-CM | POA: Diagnosis not present

## 2015-11-13 DIAGNOSIS — Z9181 History of falling: Secondary | ICD-10-CM | POA: Diagnosis not present

## 2015-11-13 DIAGNOSIS — M6281 Muscle weakness (generalized): Secondary | ICD-10-CM | POA: Diagnosis not present

## 2015-11-13 DIAGNOSIS — R278 Other lack of coordination: Secondary | ICD-10-CM | POA: Diagnosis not present

## 2015-11-14 DIAGNOSIS — R262 Difficulty in walking, not elsewhere classified: Secondary | ICD-10-CM | POA: Diagnosis not present

## 2015-11-14 DIAGNOSIS — R278 Other lack of coordination: Secondary | ICD-10-CM | POA: Diagnosis not present

## 2015-11-14 DIAGNOSIS — Z9181 History of falling: Secondary | ICD-10-CM | POA: Diagnosis not present

## 2015-11-14 DIAGNOSIS — M6281 Muscle weakness (generalized): Secondary | ICD-10-CM | POA: Diagnosis not present

## 2015-11-16 DIAGNOSIS — R278 Other lack of coordination: Secondary | ICD-10-CM | POA: Diagnosis not present

## 2015-11-16 DIAGNOSIS — R262 Difficulty in walking, not elsewhere classified: Secondary | ICD-10-CM | POA: Diagnosis not present

## 2015-11-16 DIAGNOSIS — H26491 Other secondary cataract, right eye: Secondary | ICD-10-CM | POA: Diagnosis not present

## 2015-11-16 DIAGNOSIS — H35033 Hypertensive retinopathy, bilateral: Secondary | ICD-10-CM | POA: Diagnosis not present

## 2015-11-16 DIAGNOSIS — H35363 Drusen (degenerative) of macula, bilateral: Secondary | ICD-10-CM | POA: Diagnosis not present

## 2015-11-16 DIAGNOSIS — H31012 Macula scars of posterior pole (postinflammatory) (post-traumatic), left eye: Secondary | ICD-10-CM | POA: Diagnosis not present

## 2015-11-16 DIAGNOSIS — M6281 Muscle weakness (generalized): Secondary | ICD-10-CM | POA: Diagnosis not present

## 2015-11-16 DIAGNOSIS — Z9181 History of falling: Secondary | ICD-10-CM | POA: Diagnosis not present

## 2015-11-20 DIAGNOSIS — Z9181 History of falling: Secondary | ICD-10-CM | POA: Diagnosis not present

## 2015-11-20 DIAGNOSIS — R278 Other lack of coordination: Secondary | ICD-10-CM | POA: Diagnosis not present

## 2015-11-20 DIAGNOSIS — M6281 Muscle weakness (generalized): Secondary | ICD-10-CM | POA: Diagnosis not present

## 2015-11-20 DIAGNOSIS — R262 Difficulty in walking, not elsewhere classified: Secondary | ICD-10-CM | POA: Diagnosis not present

## 2015-11-21 DIAGNOSIS — R262 Difficulty in walking, not elsewhere classified: Secondary | ICD-10-CM | POA: Diagnosis not present

## 2015-11-21 DIAGNOSIS — R278 Other lack of coordination: Secondary | ICD-10-CM | POA: Diagnosis not present

## 2015-11-21 DIAGNOSIS — Z9181 History of falling: Secondary | ICD-10-CM | POA: Diagnosis not present

## 2015-11-21 DIAGNOSIS — M6281 Muscle weakness (generalized): Secondary | ICD-10-CM | POA: Diagnosis not present

## 2015-11-23 DIAGNOSIS — H524 Presbyopia: Secondary | ICD-10-CM | POA: Diagnosis not present

## 2015-11-23 DIAGNOSIS — R278 Other lack of coordination: Secondary | ICD-10-CM | POA: Diagnosis not present

## 2015-11-23 DIAGNOSIS — Z9181 History of falling: Secondary | ICD-10-CM | POA: Diagnosis not present

## 2015-11-23 DIAGNOSIS — H02403 Unspecified ptosis of bilateral eyelids: Secondary | ICD-10-CM | POA: Diagnosis not present

## 2015-11-23 DIAGNOSIS — M6281 Muscle weakness (generalized): Secondary | ICD-10-CM | POA: Diagnosis not present

## 2015-11-23 DIAGNOSIS — H04123 Dry eye syndrome of bilateral lacrimal glands: Secondary | ICD-10-CM | POA: Diagnosis not present

## 2015-11-23 DIAGNOSIS — H01003 Unspecified blepharitis right eye, unspecified eyelid: Secondary | ICD-10-CM | POA: Diagnosis not present

## 2015-11-23 DIAGNOSIS — R262 Difficulty in walking, not elsewhere classified: Secondary | ICD-10-CM | POA: Diagnosis not present

## 2015-11-27 DIAGNOSIS — M6281 Muscle weakness (generalized): Secondary | ICD-10-CM | POA: Diagnosis not present

## 2015-11-27 DIAGNOSIS — R262 Difficulty in walking, not elsewhere classified: Secondary | ICD-10-CM | POA: Diagnosis not present

## 2015-11-27 DIAGNOSIS — Z9181 History of falling: Secondary | ICD-10-CM | POA: Diagnosis not present

## 2015-11-27 DIAGNOSIS — R278 Other lack of coordination: Secondary | ICD-10-CM | POA: Diagnosis not present

## 2015-11-28 DIAGNOSIS — Z9181 History of falling: Secondary | ICD-10-CM | POA: Diagnosis not present

## 2015-11-28 DIAGNOSIS — R278 Other lack of coordination: Secondary | ICD-10-CM | POA: Diagnosis not present

## 2015-11-28 DIAGNOSIS — M6281 Muscle weakness (generalized): Secondary | ICD-10-CM | POA: Diagnosis not present

## 2015-11-28 DIAGNOSIS — R262 Difficulty in walking, not elsewhere classified: Secondary | ICD-10-CM | POA: Diagnosis not present

## 2015-11-29 DIAGNOSIS — M6281 Muscle weakness (generalized): Secondary | ICD-10-CM | POA: Diagnosis not present

## 2015-11-29 DIAGNOSIS — Z9181 History of falling: Secondary | ICD-10-CM | POA: Diagnosis not present

## 2015-11-29 DIAGNOSIS — R278 Other lack of coordination: Secondary | ICD-10-CM | POA: Diagnosis not present

## 2015-11-29 DIAGNOSIS — R262 Difficulty in walking, not elsewhere classified: Secondary | ICD-10-CM | POA: Diagnosis not present

## 2015-12-04 DIAGNOSIS — Z9181 History of falling: Secondary | ICD-10-CM | POA: Diagnosis not present

## 2015-12-04 DIAGNOSIS — M6281 Muscle weakness (generalized): Secondary | ICD-10-CM | POA: Diagnosis not present

## 2015-12-04 DIAGNOSIS — R278 Other lack of coordination: Secondary | ICD-10-CM | POA: Diagnosis not present

## 2015-12-04 DIAGNOSIS — R262 Difficulty in walking, not elsewhere classified: Secondary | ICD-10-CM | POA: Diagnosis not present

## 2015-12-05 DIAGNOSIS — M6281 Muscle weakness (generalized): Secondary | ICD-10-CM | POA: Diagnosis not present

## 2015-12-05 DIAGNOSIS — Z9181 History of falling: Secondary | ICD-10-CM | POA: Diagnosis not present

## 2015-12-05 DIAGNOSIS — R278 Other lack of coordination: Secondary | ICD-10-CM | POA: Diagnosis not present

## 2015-12-05 DIAGNOSIS — R262 Difficulty in walking, not elsewhere classified: Secondary | ICD-10-CM | POA: Diagnosis not present

## 2015-12-06 DIAGNOSIS — R278 Other lack of coordination: Secondary | ICD-10-CM | POA: Diagnosis not present

## 2015-12-06 DIAGNOSIS — M6281 Muscle weakness (generalized): Secondary | ICD-10-CM | POA: Diagnosis not present

## 2015-12-06 DIAGNOSIS — Z9181 History of falling: Secondary | ICD-10-CM | POA: Diagnosis not present

## 2015-12-06 DIAGNOSIS — R262 Difficulty in walking, not elsewhere classified: Secondary | ICD-10-CM | POA: Diagnosis not present

## 2015-12-11 DIAGNOSIS — R278 Other lack of coordination: Secondary | ICD-10-CM | POA: Diagnosis not present

## 2015-12-11 DIAGNOSIS — Z9181 History of falling: Secondary | ICD-10-CM | POA: Diagnosis not present

## 2015-12-11 DIAGNOSIS — M6281 Muscle weakness (generalized): Secondary | ICD-10-CM | POA: Diagnosis not present

## 2015-12-11 DIAGNOSIS — R262 Difficulty in walking, not elsewhere classified: Secondary | ICD-10-CM | POA: Diagnosis not present

## 2015-12-13 DIAGNOSIS — M6281 Muscle weakness (generalized): Secondary | ICD-10-CM | POA: Diagnosis not present

## 2015-12-13 DIAGNOSIS — R278 Other lack of coordination: Secondary | ICD-10-CM | POA: Diagnosis not present

## 2015-12-13 DIAGNOSIS — Z9181 History of falling: Secondary | ICD-10-CM | POA: Diagnosis not present

## 2015-12-13 DIAGNOSIS — R262 Difficulty in walking, not elsewhere classified: Secondary | ICD-10-CM | POA: Diagnosis not present

## 2015-12-14 DIAGNOSIS — Z9181 History of falling: Secondary | ICD-10-CM | POA: Diagnosis not present

## 2015-12-14 DIAGNOSIS — M6281 Muscle weakness (generalized): Secondary | ICD-10-CM | POA: Diagnosis not present

## 2015-12-14 DIAGNOSIS — R278 Other lack of coordination: Secondary | ICD-10-CM | POA: Diagnosis not present

## 2015-12-14 DIAGNOSIS — R262 Difficulty in walking, not elsewhere classified: Secondary | ICD-10-CM | POA: Diagnosis not present

## 2015-12-18 DIAGNOSIS — Z9181 History of falling: Secondary | ICD-10-CM | POA: Diagnosis not present

## 2015-12-18 DIAGNOSIS — R262 Difficulty in walking, not elsewhere classified: Secondary | ICD-10-CM | POA: Diagnosis not present

## 2015-12-18 DIAGNOSIS — R278 Other lack of coordination: Secondary | ICD-10-CM | POA: Diagnosis not present

## 2015-12-18 DIAGNOSIS — M6281 Muscle weakness (generalized): Secondary | ICD-10-CM | POA: Diagnosis not present

## 2016-01-11 ENCOUNTER — Ambulatory Visit (INDEPENDENT_AMBULATORY_CARE_PROVIDER_SITE_OTHER): Payer: Medicare Other | Admitting: Nurse Practitioner

## 2016-01-11 ENCOUNTER — Encounter: Payer: Self-pay | Admitting: Nurse Practitioner

## 2016-01-11 VITALS — BP 124/62 | HR 86 | Temp 98.1°F | Resp 17 | Ht 63.0 in | Wt 125.4 lb

## 2016-01-11 DIAGNOSIS — B029 Zoster without complications: Secondary | ICD-10-CM

## 2016-01-11 DIAGNOSIS — W19XXXA Unspecified fall, initial encounter: Secondary | ICD-10-CM | POA: Diagnosis not present

## 2016-01-11 MED ORDER — GABAPENTIN 100 MG PO CAPS: 100.0000 mg | ORAL_CAPSULE | Freq: Three times a day (TID) | ORAL | 3 refills | Status: DC | PRN

## 2016-01-11 MED ORDER — VALACYCLOVIR HCL 1 G PO TABS: 1000.0000 mg | ORAL_TABLET | Freq: Two times a day (BID) | ORAL | 0 refills | Status: DC

## 2016-01-11 NOTE — Progress Notes (Signed)
Careteam: Patient Care Team: Estill Dooms, MD as PCP - General (Internal Medicine) Man Otho Darner, NP as Nurse Practitioner (Nurse Practitioner) Monna Fam, MD as Consulting Physician (Ophthalmology) Jari Pigg, MD as Consulting Physician (Dermatology)  Advanced Directive information Does Patient Have a Medical Advance Directive?: Yes, Type of Advance Directive: Healthcare Power of Attorney  No Known Allergies  Chief Complaint  Patient presents with  . Acute Visit    Rash from mid back to stomach x 2 days     HPI: Patient is a 83 y.o. female seen in the office today due to a rash that spans from the middle of her back around the right side to her stomach that began 2 days ago. The patient reports a burning pain with no prodromal symptoms.   The daughter reports she had a fall yesterday. The patient was leaning over her bed, lost her footing, and landed on her bottom. Patient denies any pain. No injury noted.   Review of Systems:  Review of Systems  Musculoskeletal: Positive for gait problem (uses a rolling walker).  Skin: Positive for rash.    Past Medical History:  Diagnosis Date  . 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 Essential and other specified forms of tremor   . History of fall 09/12/2015  . Memory deficit 04/03/2013  . Nodular lesion on surface of skin 07/29/2013   Right flank. Black. About 48mm diameter. Nodular.   . Other and unspecified hyperlipidemia   . Senile dementia, uncomplicated   . Urinary frequency 04/03/2013   Nocturnal, 4-5x/night.      Past Surgical History:  Procedure Laterality Date  . ABDOMINAL HYSTERECTOMY  1966  . CATARACT EXTRACTION Right 2013   Dr. Herbert Deaner  . excise skin cancer left upper arm Left 2013   Dr. Jari Pigg  . TONSILLECTOMY     Social History:   reports that she has never smoked. She has never used smokeless tobacco. She reports that she does not drink alcohol or use drugs.  History reviewed. No pertinent family  history.  Medications: Patient's Medications  New Prescriptions   GABAPENTIN (NEURONTIN) 100 MG CAPSULE    Take 1 capsule (100 mg total) by mouth 3 (three) times daily as needed.   VALACYCLOVIR (VALTREX) 1000 MG TABLET    Take 1 tablet (1,000 mg total) by mouth 2 (two) times daily.  Previous Medications   No medications on file  Modified Medications   No medications on file  Discontinued Medications   No medications on file     Physical Exam:  Vitals:   01/11/16 1534  BP: 124/62  Pulse: 86  Resp: 17  Temp: 98.1 F (36.7 C)  TempSrc: Oral  SpO2: 94%  Weight: 125 lb 6.4 oz (56.9 kg)  Height: 5\' 3"  (1.6 m)   Body mass index is 22.21 kg/m.  Physical Exam  Constitutional: She appears well-developed.  Cardiovascular: Normal rate, regular rhythm and normal heart sounds.   Pulmonary/Chest: Effort normal and breath sounds normal.  Musculoskeletal:  Uses a rolling walker.   Neurological: She is alert.  Skin: Skin is warm and dry. Rash (Clusters of red papules and vesicles that extend from the middle of the back around the ride side to the abdomen following dermatome T9. ) noted.  Psychiatric: She has a normal mood and affect. Her behavior is normal.    Labs reviewed: Basic Metabolic Panel:  Recent Labs  06/26/15 1641  NA 141  K 3.6  CL 103  CO2 24  GLUCOSE 95  BUN 22  CREATININE 0.88  CALCIUM 9.9   Liver Function Tests:  Recent Labs  06/26/15 1641  AST 20  ALT 14  ALKPHOS 71  BILITOT 0.3  PROT 6.3  ALBUMIN 3.8   No results for input(s): LIPASE, AMYLASE in the last 8760 hours. No results for input(s): AMMONIA in the last 8760 hours. CBC: No results for input(s): WBC, NEUTROABS, HGB, HCT, MCV, PLT in the last 8760 hours. Lipid Panel: No results for input(s): CHOL, HDL, LDLCALC, TRIG, CHOLHDL, LDLDIRECT in the last 8760 hours. TSH: No results for input(s): TSH in the last 8760 hours. A1C: No results found for: HGBA1C   Assessment/Plan 1. Herpes  zoster without complication - Education provided about shingles - valACYclovir (VALTREX) 1000 MG tablet; Take 1 tablet (1,000 mg total) by mouth 2 (two) times daily.  Dispense: 14 tablet; Refill: 0 - gabapentin (NEURONTIN) 100 MG capsule; Take 1 capsule (100 mg total) by mouth 3 (three) times daily as needed.  Dispense: 90 capsule; Refill: 3 - May also use Tylenol 325 mg (2 tablets) Q6H PRN for pain or discomfort. Do not exceed 9 tablets in 24 hours.  - Please call if the rash starts having purulent drainage or worsening of pain   2. Fall, initial encounter  - Patient fell yesterday at assisted living facility where she lives, was assisted up by staff. Denies pain. No injury noted. - PT/OT ordered.   Carlos American. Harle Battiest  [facility] [phone] 8 am - 5 pm) [phone] (after hours)

## 2016-01-11 NOTE — Patient Instructions (Addendum)
May take Tylenol 325 mg (2 tablets) every 6 hours as needed for pain or discomfort. Do not exceed 9 tablets in 24 hours.   Gabapentin as needed for the pain every 8 hours.    Shingles Shingles is an infection that causes a painful skin rash and fluid-filled blisters. Shingles is caused by the same virus that causes chickenpox. Shingles only develops in people who:  Have had chickenpox.  Have gotten the chickenpox vaccine. (This is rare.) The first symptoms of shingles may be itching, tingling, or pain in an area on your skin. A rash will follow in a few days or weeks. The rash is usually on one side of the body in a bandlike or beltlike pattern. Over time, the rash turns into fluid-filled blisters that break open, scab over, and dry up. Medicines may:  Help you manage pain.  Help you recover more quickly.  Help to prevent long-term problems. Follow these instructions at home: Medicines  Take medicines only as told by your doctor.  Apply an anti-itch or numbing cream to the affected area as told by your doctor. Blister and Rash Care  Take a cool bath or put cool compresses on the area of the rash or blisters as told by your doctor. This may help with pain and itching.  Keep your rash covered with a loose bandage (dressing). Wear loose-fitting clothing.  Keep your rash and blisters clean with mild soap and cool water or as told by your doctor.  Check your rash every day for signs of infection. These include redness, swelling, and pain that lasts or gets worse.  Do not pick your blisters.  Do not scratch your rash. General instructions  Rest as told by your doctor.  Keep all follow-up visits as told by your doctor. This is important.  Until your blisters scab over, your infection can cause chickenpox in people who have never had it or been vaccinated against it. To prevent this from happening, avoid touching other people or being around other people,  especially:  Babies.  Pregnant women.  Children who have eczema.  Elderly people who have transplants.  People who have chronic illnesses, such as leukemia or AIDS. Contact a doctor if:  Your pain does not get better with medicine.  Your pain does not get better after the rash heals.  Your rash looks infected. Signs of infection include:  Redness.  Swelling.  Pain that lasts or gets worse. Get help right away if:  The rash is on your face or nose.  You have pain in your face, pain around your eye area, or loss of feeling on one side of your face.  You have ear pain or you have ringing in your ear.  You have loss of taste.  Your condition gets worse. This information is not intended to replace advice given to you by your health care provider. Make sure you discuss any questions you have with your health care provider. Document Released: 06/12/2007 Document Revised: 08/20/2015 Document Reviewed: 10/05/2013 Elsevier Interactive Patient Education  2017 [employer].

## 2016-01-17 ENCOUNTER — Encounter: Payer: Self-pay | Admitting: Internal Medicine

## 2016-01-17 ENCOUNTER — Telehealth: Payer: Self-pay | Admitting: *Deleted

## 2016-01-17 NOTE — Telephone Encounter (Signed)
Letter written

## 2016-01-17 NOTE — Telephone Encounter (Signed)
Debra [name] notified to pick up

## 2016-01-17 NOTE — Telephone Encounter (Signed)
Patient daughter, [name], called and stated that patient has Short Term Memory Loss and Dementia and lives at [facility]. Stated that patient's husband just passed away. Daughter is in need of note to give to the Social Security Office stating that patient needs help with her Finances and Business due to Short Term Memory Loss and Dementia. Daughter stated that she is the POA but the Social Security office requires a letter from the doctor. Please Advise.

## 2016-01-26 DIAGNOSIS — R2681 Unsteadiness on feet: Secondary | ICD-10-CM | POA: Diagnosis not present

## 2016-01-26 DIAGNOSIS — R488 Other symbolic dysfunctions: Secondary | ICD-10-CM | POA: Diagnosis not present

## 2016-01-26 DIAGNOSIS — M6281 Muscle weakness (generalized): Secondary | ICD-10-CM | POA: Diagnosis not present

## 2016-01-26 DIAGNOSIS — R296 Repeated falls: Secondary | ICD-10-CM | POA: Diagnosis not present

## 2016-01-27 DIAGNOSIS — R2681 Unsteadiness on feet: Secondary | ICD-10-CM | POA: Diagnosis not present

## 2016-01-27 DIAGNOSIS — M6281 Muscle weakness (generalized): Secondary | ICD-10-CM | POA: Diagnosis not present

## 2016-01-27 DIAGNOSIS — R488 Other symbolic dysfunctions: Secondary | ICD-10-CM | POA: Diagnosis not present

## 2016-01-27 DIAGNOSIS — R296 Repeated falls: Secondary | ICD-10-CM | POA: Diagnosis not present

## 2016-01-28 ENCOUNTER — Emergency Department (HOSPITAL_COMMUNITY): Payer: Medicare Other

## 2016-01-28 ENCOUNTER — Encounter (HOSPITAL_COMMUNITY): Payer: Self-pay | Admitting: Emergency Medicine

## 2016-01-28 ENCOUNTER — Inpatient Hospital Stay (HOSPITAL_COMMUNITY)
Admission: EM | Admit: 2016-01-28 | Discharge: 2016-02-01 | DRG: 470 | Disposition: A | Discharge status: Skilled Nursing Facility | Payer: Medicare Other | Attending: Family Medicine | Admitting: Family Medicine

## 2016-01-28 DIAGNOSIS — R296 Repeated falls: Secondary | ICD-10-CM | POA: Diagnosis present

## 2016-01-28 DIAGNOSIS — F0390 Unspecified dementia without behavioral disturbance: Secondary | ICD-10-CM | POA: Diagnosis present

## 2016-01-28 DIAGNOSIS — S299XXA Unspecified injury of thorax, initial encounter: Secondary | ICD-10-CM | POA: Diagnosis not present

## 2016-01-28 DIAGNOSIS — F0280 Dementia in other diseases classified elsewhere without behavioral disturbance: Secondary | ICD-10-CM | POA: Diagnosis present

## 2016-01-28 DIAGNOSIS — Z66 Do not resuscitate: Secondary | ICD-10-CM | POA: Diagnosis present

## 2016-01-28 DIAGNOSIS — M25551 Pain in right hip: Secondary | ICD-10-CM | POA: Diagnosis not present

## 2016-01-28 DIAGNOSIS — S72001A Fracture of unspecified part of neck of right femur, initial encounter for closed fracture: Secondary | ICD-10-CM | POA: Diagnosis not present

## 2016-01-28 DIAGNOSIS — Z79899 Other long term (current) drug therapy: Secondary | ICD-10-CM

## 2016-01-28 DIAGNOSIS — S0990XA Unspecified injury of head, initial encounter: Secondary | ICD-10-CM | POA: Diagnosis not present

## 2016-01-28 DIAGNOSIS — F05 Delirium due to known physiological condition: Secondary | ICD-10-CM | POA: Diagnosis not present

## 2016-01-28 DIAGNOSIS — G20 Parkinson's disease: Secondary | ICD-10-CM | POA: Diagnosis present

## 2016-01-28 DIAGNOSIS — Z01811 Encounter for preprocedural respiratory examination: Secondary | ICD-10-CM

## 2016-01-28 DIAGNOSIS — S199XXA Unspecified injury of neck, initial encounter: Secondary | ICD-10-CM | POA: Diagnosis not present

## 2016-01-28 DIAGNOSIS — M25559 Pain in unspecified hip: Secondary | ICD-10-CM | POA: Diagnosis not present

## 2016-01-28 DIAGNOSIS — D72829 Elevated white blood cell count, unspecified: Secondary | ICD-10-CM | POA: Diagnosis present

## 2016-01-28 DIAGNOSIS — D72820 Lymphocytosis (symptomatic): Secondary | ICD-10-CM | POA: Diagnosis present

## 2016-01-28 DIAGNOSIS — Z96649 Presence of unspecified artificial hip joint: Secondary | ICD-10-CM

## 2016-01-28 DIAGNOSIS — E785 Hyperlipidemia, unspecified: Secondary | ICD-10-CM | POA: Diagnosis not present

## 2016-01-28 DIAGNOSIS — R9431 Abnormal electrocardiogram [ECG] [EKG]: Secondary | ICD-10-CM | POA: Diagnosis present

## 2016-01-28 DIAGNOSIS — Z85828 Personal history of other malignant neoplasm of skin: Secondary | ICD-10-CM

## 2016-01-28 DIAGNOSIS — R259 Unspecified abnormal involuntary movements: Secondary | ICD-10-CM | POA: Diagnosis not present

## 2016-01-28 DIAGNOSIS — D62 Acute posthemorrhagic anemia: Secondary | ICD-10-CM | POA: Diagnosis not present

## 2016-01-28 DIAGNOSIS — W19XXXA Unspecified fall, initial encounter: Secondary | ICD-10-CM | POA: Diagnosis present

## 2016-01-28 NOTE — ED Notes (Signed)
Bed: [id number] Expected date:  Expected time:  Means of arrival:  Comments: 83 yo F/ Fall

## 2016-01-28 NOTE — ED Provider Notes (Signed)
[facility] DEPT Provider Note   CSN: [id number] Arrival date & time: 01/28/16  2243   By signing my name below, I, Debra [name], attest that this documentation has been prepared under the direction and in the presence of Debra Tortorella, MD . Electronically Signed: Neta [name], ED Scribe. 01/28/2016. 11:28 PM.   History   Chief Complaint Chief Complaint  Patient presents with  . Fall   The history is provided by a relative. The history is limited by the condition of the patient. No language interpreter was used.  Fall  This is a new problem. The current episode started more than 2 days ago. The problem occurs rarely. The problem has not changed since onset.Nothing aggravates the symptoms. Nothing relieves the symptoms. She has tried nothing for the symptoms. The treatment provided no relief.  LEVEL 5 CAVEAT DUE TO DEMENTIA  HPI Comments:  Debra [name] is a 83 y.o. female with PMHx of dementia who presents to the Emergency Department, here due to a fall that occurred ~1 weeks ago. Per pt's daughter, pt was found by EMS at [facility] assisted living with a wound on her head due to a fall that occurred ~1 week ago. Daughter reports that pt was complaining about right hip pain since the fall. Per triage note, pt had been ambulatory at home since the fall. No alleviating factors noted.   Past Medical History:  Diagnosis Date  . 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 Essential and other specified forms of tremor   . History of fall 09/12/2015  . Memory deficit 04/03/2013  . Nodular lesion on surface of skin 07/29/2013   Right flank. Black. About 104mm diameter. Nodular.   . Other and unspecified hyperlipidemia   . Senile dementia, uncomplicated   . Urinary frequency 04/03/2013   Nocturnal, 4-5x/night.       Patient Active Problem List   Diagnosis Date Noted  . History of fall 09/12/2015  . Hallucinations 02/08/2015  . Hearing deficit 02/10/2014  . Abnormality of gait 11/25/2013  .  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 Tremor 04/03/2013  . Urinary frequency 04/03/2013  . Memory deficit 04/03/2013  . 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 Dyslipidemia 04/03/2013    Past Surgical History:  Procedure Laterality Date  . ABDOMINAL HYSTERECTOMY  1966  . CATARACT EXTRACTION Right 2013   Dr. Herbert Deaner  . excise skin cancer left upper arm Left 2013   Dr. Jari Pigg  . TONSILLECTOMY      OB History    No data available       Home Medications    Prior to Admission medications   Medication Sig Start Date End Date Taking? Authorizing Provider  gabapentin (NEURONTIN) 100 MG capsule Take 1 capsule (100 mg total) by mouth 3 (three) times daily as needed. 01/11/16   Lauree Chandler, NP  valACYclovir (VALTREX) 1000 MG tablet Take 1 tablet (1,000 mg total) by mouth 2 (two) times daily. 01/11/16   Lauree Chandler, NP    Family History History reviewed. No pertinent family history.  Social History Social History  Substance Use Topics  . Smoking status: Never Smoker  . Smokeless tobacco: Never Used  . Alcohol use No     Allergies   Patient has no known allergies.   Review of Systems Review of Systems  Unable to perform ROS: Dementia  Musculoskeletal: Positive for arthralgias.  LEVEL 5 CAVEAT DUE TO DEMENTIA  Physical Exam Updated Vital Signs BP 169/84 (BP Location: Left Arm)   Pulse  86   Temp 98.6 F (37 C) (Oral)   Resp 18   Ht 5\' 3"  (1.6 m)   Wt 125 lb (56.7 kg)   SpO2 94%   BMI 22.14 kg/m   Physical Exam  Constitutional: She appears well-developed and well-nourished. No distress.  HENT:  Head: Normocephalic and atraumatic.  Mouth/Throat: Oropharynx is clear and moist. No oropharyngeal exudate.  Trachea midline. No battle's sign or raccoon eyes. No hemotympanum on right or left.  Eyes: Conjunctivae and EOM are normal. Pupils are equal, round, and reactive to light.  Neck: Trachea normal and normal range of motion. Neck supple. No JVD present. Carotid bruit is not  present.  Cardiovascular: Normal rate, regular rhythm, normal heart sounds and intact distal pulses.  Exam reveals no gallop and no friction rub.   No murmur heard. DP pulse 2+  Pulmonary/Chest: Effort normal and breath sounds normal. No stridor. She has no wheezes. She has no rales.  Abdominal: Soft. Bowel sounds are normal. She exhibits no mass. There is no tenderness. There is no rebound and no guarding.  Musculoskeletal: She exhibits no tenderness.  No deformity of shin or hips. No foreshortening or external rotation. Negative anterior and posterior Drawer test of bilateral knees. Pelvis stable.   Lymphadenopathy:    She has no cervical adenopathy.  Neurological: She is alert. She has normal reflexes. She displays normal reflexes. No cranial nerve deficit. She exhibits normal muscle tone. Coordination normal.  Cranial nerves 2-12 intact  Skin: Skin is warm and dry. Capillary refill takes less than 2 seconds. She is not diaphoretic.  Psychiatric: She has a normal mood and affect. Her behavior is normal.     ED Treatments / Results   Vitals:   01/28/16 2247 01/29/16 0058  BP: 169/84 (!) 140/54  Pulse: 86 85  Resp: 18 14  Temp: 98.6 F (37 C)     DIAGNOSTIC STUDIES:  Oxygen Saturation is 94% on RA, normal by my interpretation.    COORDINATION OF CARE:  11:28 PM Discussed treatment plan with pt at bedside and pt agreed to plan.   Results for orders placed or performed during the hospital encounter of 01/28/16  CBC with Differential/Platelet  Result Value Ref Range   WBC 14.5 (H) 4.0 - 10.5 K/uL   RBC 3.67 (L) 3.87 - 5.11 MIL/uL   Hemoglobin 11.2 (L) 12.0 - 15.0 g/dL   HCT 33.7 (L) 36.0 - 46.0 %   MCV 91.8 78.0 - 100.0 fL   MCH 30.5 26.0 - 34.0 pg   MCHC 33.2 30.0 - 36.0 g/dL   RDW 13.4 11.5 - 15.5 %   Platelets 210 150 - 400 K/uL   Neutrophils Relative % 78 %   Neutro Abs 11.3 (H) 1.7 - 7.7 K/uL   Lymphocytes Relative 14 %   Lymphs Abs 2.0 0.7 - 4.0 K/uL    Monocytes Relative 8 %   Monocytes Absolute 1.2 (H) 0.1 - 1.0 K/uL   Eosinophils Relative 0 %   Eosinophils Absolute 0.0 0.0 - 0.7 K/uL   Basophils Relative 0 %   Basophils Absolute 0.0 0.0 - 0.1 K/uL  I-Stat Chem 8, ED  Result Value Ref Range   Sodium 136 135 - 145 mmol/L   Potassium 4.0 3.5 - 5.1 mmol/L   Chloride 105 101 - 111 mmol/L   BUN 28 (H) 6 - 20 mg/dL   Creatinine, Ser 0.80 0.44 - 1.00 mg/dL   Glucose, Bld 174 (H) 65 - 99 mg/dL  Calcium, Ion 1.30 1.15 - 1.40 mmol/L   TCO2 21 0 - 100 mmol/L   Hemoglobin 11.6 (L) 12.0 - 15.0 g/dL   HCT 34.0 (L) 36.0 - 46.0 %   Dg Chest 1 View  Result Date: 01/29/2016 CLINICAL DATA:  83 year old female with fall and right hip injury. EXAM: CHEST 1 VIEW COMPARISON:  None. FINDINGS: The lungs are clear. There is no pleural effusion or pneumothorax. The cardiac silhouette is within normal limits. There is atherosclerotic calcification of the aortic arch. There is osteopenia with degenerative changes of the spine and shoulders. No acute fracture. IMPRESSION: No active disease. Electronically Signed   By: Anner Crete M.D.   On: 01/29/2016 00:37   Dg Hip Unilat W Or Wo Pelvis 2-3 Views Right  Result Date: 01/29/2016 CLINICAL DATA:  83 year old female with fall and right hip pain. EXAM: DG HIP (WITH OR WITHOUT PELVIS) 2-3V RIGHT COMPARISON:  Abdominal radiograph dated 11/27/2008 FINDINGS: There is a mildly displaced fracture of the right femoral neck with mild proximal migration of the femoral shaft in relation to the head of the femur. The femoral head remains in anatomic alignment with the acetabulum. There is no dislocation. The bones are osteopenic. The soft tissues appear unremarkable. IMPRESSION: Mildly displaced fracture of the right femoral neck. Electronically Signed   By: Anner Crete M.D.   On: 01/29/2016 00:36     Procedures Procedures (including critical care time)  Case d/w Dr. Wynelle Link who will see the patient in consult    Final Clinical Impressions(s) / ED Diagnoses  Hip fracture: admit to hospital.  NPO  I personally performed the services described in this documentation, which was scribed in my presence. The recorded information has been reviewed and is accurate.       Veatrice Kells, MD 01/29/16 [id number]

## 2016-01-28 NOTE — ED Triage Notes (Signed)
Per EMS pt from [facility] for evaluation of a fall that occurred a week ago. Staff was unable to determine if fall was witnessed and pt has been having pain in right hip since fall. Hx of Alzheimers. Unknown if there was LOC after fall. EMS stated that pt had been ambulating per staff for the last week after fall.

## 2016-01-29 ENCOUNTER — Emergency Department (HOSPITAL_COMMUNITY): Payer: Medicare Other

## 2016-01-29 ENCOUNTER — Encounter (HOSPITAL_COMMUNITY)
Admission: EM | Disposition: A | Discharge status: Skilled Nursing Facility | Payer: Self-pay | Source: Home / Self Care | Attending: Internal Medicine

## 2016-01-29 ENCOUNTER — Encounter (HOSPITAL_COMMUNITY): Payer: Self-pay | Admitting: Emergency Medicine

## 2016-01-29 ENCOUNTER — Inpatient Hospital Stay (HOSPITAL_COMMUNITY): Payer: Medicare Other | Admitting: Anesthesiology

## 2016-01-29 ENCOUNTER — Inpatient Hospital Stay (HOSPITAL_COMMUNITY): Payer: Medicare Other

## 2016-01-29 DIAGNOSIS — S72009A Fracture of unspecified part of neck of unspecified femur, initial encounter for closed fracture: Secondary | ICD-10-CM | POA: Diagnosis not present

## 2016-01-29 DIAGNOSIS — S72001D Fracture of unspecified part of neck of right femur, subsequent encounter for closed fracture with routine healing: Secondary | ICD-10-CM | POA: Diagnosis not present

## 2016-01-29 DIAGNOSIS — D72825 Bandemia: Secondary | ICD-10-CM | POA: Diagnosis not present

## 2016-01-29 DIAGNOSIS — F0280 Dementia in other diseases classified elsewhere without behavioral disturbance: Secondary | ICD-10-CM | POA: Diagnosis present

## 2016-01-29 DIAGNOSIS — F28 Other psychotic disorder not due to a substance or known physiological condition: Secondary | ICD-10-CM | POA: Diagnosis not present

## 2016-01-29 DIAGNOSIS — D72829 Elevated white blood cell count, unspecified: Secondary | ICD-10-CM | POA: Diagnosis not present

## 2016-01-29 DIAGNOSIS — F0390 Unspecified dementia without behavioral disturbance: Secondary | ICD-10-CM | POA: Diagnosis present

## 2016-01-29 DIAGNOSIS — R9431 Abnormal electrocardiogram [ECG] [EKG]: Secondary | ICD-10-CM | POA: Diagnosis present

## 2016-01-29 DIAGNOSIS — E785 Hyperlipidemia, unspecified: Secondary | ICD-10-CM | POA: Diagnosis not present

## 2016-01-29 DIAGNOSIS — Z471 Aftercare following joint replacement surgery: Secondary | ICD-10-CM | POA: Diagnosis not present

## 2016-01-29 DIAGNOSIS — S199XXA Unspecified injury of neck, initial encounter: Secondary | ICD-10-CM | POA: Diagnosis not present

## 2016-01-29 DIAGNOSIS — Z96641 Presence of right artificial hip joint: Secondary | ICD-10-CM | POA: Diagnosis not present

## 2016-01-29 DIAGNOSIS — R531 Weakness: Secondary | ICD-10-CM | POA: Diagnosis not present

## 2016-01-29 DIAGNOSIS — Z66 Do not resuscitate: Secondary | ICD-10-CM | POA: Diagnosis not present

## 2016-01-29 DIAGNOSIS — G20 Parkinson's disease: Secondary | ICD-10-CM | POA: Diagnosis not present

## 2016-01-29 DIAGNOSIS — S72001A Fracture of unspecified part of neck of right femur, initial encounter for closed fracture: Principal | ICD-10-CM

## 2016-01-29 DIAGNOSIS — W19XXXA Unspecified fall, initial encounter: Secondary | ICD-10-CM | POA: Diagnosis not present

## 2016-01-29 DIAGNOSIS — R296 Repeated falls: Secondary | ICD-10-CM | POA: Diagnosis not present

## 2016-01-29 DIAGNOSIS — Z85828 Personal history of other malignant neoplasm of skin: Secondary | ICD-10-CM | POA: Diagnosis not present

## 2016-01-29 DIAGNOSIS — S0990XA Unspecified injury of head, initial encounter: Secondary | ICD-10-CM | POA: Diagnosis not present

## 2016-01-29 DIAGNOSIS — D62 Acute posthemorrhagic anemia: Secondary | ICD-10-CM | POA: Diagnosis not present

## 2016-01-29 DIAGNOSIS — D72820 Lymphocytosis (symptomatic): Secondary | ICD-10-CM

## 2016-01-29 DIAGNOSIS — R251 Tremor, unspecified: Secondary | ICD-10-CM | POA: Diagnosis not present

## 2016-01-29 DIAGNOSIS — F05 Delirium due to known physiological condition: Secondary | ICD-10-CM | POA: Diagnosis not present

## 2016-01-29 DIAGNOSIS — Z79899 Other long term (current) drug therapy: Secondary | ICD-10-CM | POA: Diagnosis not present

## 2016-01-29 DIAGNOSIS — R430 Anosmia: Secondary | ICD-10-CM | POA: Diagnosis not present

## 2016-01-29 DIAGNOSIS — M25551 Pain in right hip: Secondary | ICD-10-CM | POA: Diagnosis not present

## 2016-01-29 DIAGNOSIS — Z9181 History of falling: Secondary | ICD-10-CM | POA: Diagnosis not present

## 2016-01-29 HISTORY — DX: Lymphocytosis (symptomatic): D72.820

## 2016-01-29 HISTORY — PX: HIP ARTHROPLASTY: SHX981

## 2016-01-29 LAB — CBC
HEMATOCRIT: 34.7 % — AB (ref 36.0–46.0)
Hemoglobin: 11.3 g/dL — ABNORMAL LOW (ref 12.0–15.0)
MCH: 30.2 pg (ref 26.0–34.0)
MCHC: 32.6 g/dL (ref 30.0–36.0)
MCV: 92.8 fL (ref 78.0–100.0)
Platelets: 226 10*3/uL (ref 150–400)
RBC: 3.74 MIL/uL — AB (ref 3.87–5.11)
RDW: 13.5 % (ref 11.5–15.5)
WBC: 14.7 10*3/uL — AB (ref 4.0–10.5)

## 2016-01-29 LAB — URINALYSIS, ROUTINE W REFLEX MICROSCOPIC
BACTERIA UA: NONE SEEN
Bilirubin Urine: NEGATIVE
Glucose, UA: 50 mg/dL — AB
KETONES UR: NEGATIVE mg/dL
Leukocytes, UA: NEGATIVE
Nitrite: NEGATIVE
PROTEIN: NEGATIVE mg/dL
Specific Gravity, Urine: 1.017 (ref 1.005–1.030)
pH: 6 (ref 5.0–8.0)

## 2016-01-29 LAB — I-STAT CHEM 8, ED
BUN: 28 mg/dL — ABNORMAL HIGH (ref 6–20)
CHLORIDE: 105 mmol/L (ref 101–111)
CREATININE: 0.8 mg/dL (ref 0.44–1.00)
Calcium, Ion: 1.3 mmol/L (ref 1.15–1.40)
GLUCOSE: 174 mg/dL — AB (ref 65–99)
HCT: 34 % — ABNORMAL LOW (ref 36.0–46.0)
Hemoglobin: 11.6 g/dL — ABNORMAL LOW (ref 12.0–15.0)
POTASSIUM: 4 mmol/L (ref 3.5–5.1)
Sodium: 136 mmol/L (ref 135–145)
TCO2: 21 mmol/L (ref 0–100)

## 2016-01-29 LAB — TYPE AND SCREEN
ABO/RH(D): A POS
Antibody Screen: NEGATIVE

## 2016-01-29 LAB — BASIC METABOLIC PANEL
ANION GAP: 6 (ref 5–15)
BUN: 26 mg/dL — ABNORMAL HIGH (ref 6–20)
CO2: 26 mmol/L (ref 22–32)
Calcium: 9.6 mg/dL (ref 8.9–10.3)
Chloride: 104 mmol/L (ref 101–111)
Creatinine, Ser: 0.85 mg/dL (ref 0.44–1.00)
GFR calc Af Amer: >60 mL/min (ref >60)
GFR calc non Af Amer: 58 mL/min — ABNORMAL LOW (ref >60)
GLUCOSE: 117 mg/dL — AB (ref 65–99)
POTASSIUM: 3.8 mmol/L (ref 3.5–5.1)
Sodium: 136 mmol/L (ref 135–145)

## 2016-01-29 LAB — TSH: TSH: 1.336 u[IU]/mL (ref 0.350–4.500)

## 2016-01-29 LAB — CBC WITH DIFFERENTIAL/PLATELET
BASOS ABS: 0 10*3/uL (ref 0.0–0.1)
Basophils Relative: 0 %
Eosinophils Absolute: 0 10*3/uL (ref 0.0–0.7)
Eosinophils Relative: 0 %
HEMATOCRIT: 33.7 % — AB (ref 36.0–46.0)
Hemoglobin: 11.2 g/dL — ABNORMAL LOW (ref 12.0–15.0)
LYMPHS ABS: 2 10*3/uL (ref 0.7–4.0)
LYMPHS PCT: 14 %
MCH: 30.5 pg (ref 26.0–34.0)
MCHC: 33.2 g/dL (ref 30.0–36.0)
MCV: 91.8 fL (ref 78.0–100.0)
Monocytes Absolute: 1.2 10*3/uL — ABNORMAL HIGH (ref 0.1–1.0)
Monocytes Relative: 8 %
NEUTROS ABS: 11.3 10*3/uL — AB (ref 1.7–7.7)
Neutrophils Relative %: 78 %
Platelets: 210 10*3/uL (ref 150–400)
RBC: 3.67 MIL/uL — AB (ref 3.87–5.11)
RDW: 13.4 % (ref 11.5–15.5)
WBC: 14.5 10*3/uL — AB (ref 4.0–10.5)

## 2016-01-29 LAB — SURGICAL PCR SCREEN
MRSA, PCR: NEGATIVE
Staphylococcus aureus: NEGATIVE

## 2016-01-29 LAB — ALBUMIN: ALBUMIN: 3.6 g/dL (ref 3.5–5.0)

## 2016-01-29 LAB — TROPONIN I
Troponin I: <0.03 ng/mL (ref <0.03)
Troponin I: 0.04 ng/mL (ref <0.03)

## 2016-01-29 LAB — ABO/RH: ABO/RH(D): A POS

## 2016-01-29 SURGERY — HEMIARTHROPLASTY, HIP, DIRECT ANTERIOR APPROACH, FOR FRACTURE
Anesthesia: General | Site: Hip | Laterality: Right

## 2016-01-29 MED ORDER — MENTHOL 3 MG MT LOZG: 1.0000 | LOZENGE | OROMUCOSAL | Status: DC | PRN

## 2016-01-29 MED ORDER — DEXAMETHASONE SODIUM PHOSPHATE 10 MG/ML IJ SOLN: 8.0000 mg | Freq: Once | INTRAMUSCULAR | Status: DC

## 2016-01-29 MED ORDER — CEFAZOLIN SODIUM-DEXTROSE 2-4 GM/100ML-% IV SOLN
INTRAVENOUS | Status: AC
  Filled 2016-01-29: qty 100

## 2016-01-29 MED ORDER — PHENOL 1.4 % MT LIQD: 1.0000 | OROMUCOSAL | Status: DC | PRN

## 2016-01-29 MED ORDER — BUPIVACAINE HCL (PF) 0.25 % IJ SOLN
INTRAMUSCULAR | Status: AC
  Filled 2016-01-29: qty 30

## 2016-01-29 MED ORDER — FENTANYL CITRATE (PF) 100 MCG/2ML IJ SOLN
INTRAMUSCULAR | Status: AC
  Filled 2016-01-29: qty 2

## 2016-01-29 MED ORDER — PROPOFOL 10 MG/ML IV BOLUS
INTRAVENOUS | Status: DC | PRN
  Administered 2016-01-29: 70 mg via INTRAVENOUS

## 2016-01-29 MED ORDER — POLYETHYLENE GLYCOL 3350 17 G PO PACK: 17.0000 g | PACK | Freq: Every day | ORAL | Status: DC | PRN

## 2016-01-29 MED ORDER — CHLORHEXIDINE GLUCONATE 4 % EX LIQD: 60.0000 mL | Freq: Once | CUTANEOUS | Status: DC

## 2016-01-29 MED ORDER — 0.9 % SODIUM CHLORIDE (POUR BTL) OPTIME
TOPICAL | Status: DC | PRN
  Administered 2016-01-29: 1000 mL

## 2016-01-29 MED ORDER — MORPHINE SULFATE (PF) 2 MG/ML IV SOLN
0.5000 mg | INTRAVENOUS | Status: DC | PRN
  Administered 2016-01-30: 0.5 mg via INTRAVENOUS
  Filled 2016-01-29: qty 1

## 2016-01-29 MED ORDER — ONDANSETRON HCL 4 MG/2ML IJ SOLN
INTRAMUSCULAR | Status: DC | PRN
  Administered 2016-01-29: 4 mg via INTRAVENOUS

## 2016-01-29 MED ORDER — METHOCARBAMOL 500 MG PO TABS: 500.0000 mg | ORAL_TABLET | Freq: Four times a day (QID) | ORAL | Status: DC | PRN

## 2016-01-29 MED ORDER — HALOPERIDOL LACTATE 5 MG/ML IJ SOLN: 2.0000 mg | Freq: Four times a day (QID) | INTRAMUSCULAR | Status: DC | PRN

## 2016-01-29 MED ORDER — SUCCINYLCHOLINE CHLORIDE 20 MG/ML IJ SOLN
INTRAMUSCULAR | Status: DC | PRN
  Administered 2016-01-29: 80 mg via INTRAVENOUS

## 2016-01-29 MED ORDER — ROCURONIUM BROMIDE 50 MG/5ML IV SOSY
PREFILLED_SYRINGE | INTRAVENOUS | Status: AC
  Filled 2016-01-29: qty 5

## 2016-01-29 MED ORDER — ONDANSETRON HCL 4 MG PO TABS: 4.0000 mg | ORAL_TABLET | Freq: Four times a day (QID) | ORAL | Status: DC | PRN

## 2016-01-29 MED ORDER — PROPOFOL 10 MG/ML IV BOLUS
INTRAVENOUS | Status: AC
  Filled 2016-01-29: qty 20

## 2016-01-29 MED ORDER — ONDANSETRON HCL 4 MG/2ML IJ SOLN: 4.0000 mg | Freq: Four times a day (QID) | INTRAMUSCULAR | Status: DC | PRN

## 2016-01-29 MED ORDER — SODIUM CHLORIDE 0.9 % IV SOLN
INTRAVENOUS | Status: AC
  Administered 2016-01-29: 05:00:00 via INTRAVENOUS

## 2016-01-29 MED ORDER — SODIUM CHLORIDE 0.9 % IV SOLN: INTRAVENOUS | Status: DC

## 2016-01-29 MED ORDER — ENOXAPARIN SODIUM 40 MG/0.4ML ~~LOC~~ SOLN
40.0000 mg | SUBCUTANEOUS | Status: DC
  Administered 2016-01-30 – 2016-02-01 (×3): 40 mg via SUBCUTANEOUS
  Filled 2016-01-29 (×3): qty 0.4

## 2016-01-29 MED ORDER — LIDOCAINE 2% (20 MG/ML) 5 ML SYRINGE
INTRAMUSCULAR | Status: AC
  Filled 2016-01-29: qty 5

## 2016-01-29 MED ORDER — CEFAZOLIN SODIUM-DEXTROSE 2-4 GM/100ML-% IV SOLN
2.0000 g | INTRAVENOUS | Status: AC
  Administered 2016-01-29: 2 g via INTRAVENOUS

## 2016-01-29 MED ORDER — SUCCINYLCHOLINE CHLORIDE 200 MG/10ML IV SOSY
PREFILLED_SYRINGE | INTRAVENOUS | Status: AC
  Filled 2016-01-29: qty 10

## 2016-01-29 MED ORDER — POVIDONE-IODINE 10 % EX SWAB
2.0000 | Freq: Once | CUTANEOUS | Status: DC
Start: 2016-01-29 — End: 2016-01-29

## 2016-01-29 MED ORDER — FENTANYL CITRATE (PF) 100 MCG/2ML IJ SOLN
INTRAMUSCULAR | Status: DC | PRN
  Administered 2016-01-29 (×3): 25 ug via INTRAVENOUS

## 2016-01-29 MED ORDER — PHENYLEPHRINE 40 MCG/ML (10ML) SYRINGE FOR IV PUSH (FOR BLOOD PRESSURE SUPPORT)
PREFILLED_SYRINGE | INTRAVENOUS | Status: AC
  Filled 2016-01-29: qty 10

## 2016-01-29 MED ORDER — METOCLOPRAMIDE HCL 5 MG PO TABS: 5.0000 mg | ORAL_TABLET | Freq: Three times a day (TID) | ORAL | Status: DC | PRN

## 2016-01-29 MED ORDER — BUPIVACAINE HCL (PF) 0.25 % IJ SOLN
INTRAMUSCULAR | Status: DC | PRN
  Administered 2016-01-29: 20 mL

## 2016-01-29 MED ORDER — METOCLOPRAMIDE HCL 5 MG/ML IJ SOLN: 5.0000 mg | Freq: Three times a day (TID) | INTRAMUSCULAR | Status: DC | PRN

## 2016-01-29 MED ORDER — PHENYLEPHRINE HCL 10 MG/ML IJ SOLN
INTRAMUSCULAR | Status: DC | PRN
  Administered 2016-01-29 (×3): 40 ug via INTRAVENOUS

## 2016-01-29 MED ORDER — ONDANSETRON HCL 4 MG/2ML IJ SOLN
INTRAMUSCULAR | Status: AC
Start: 2016-01-29 — End: 2016-01-29
  Filled 2016-01-29: qty 2

## 2016-01-29 MED ORDER — LIDOCAINE HCL (CARDIAC) 20 MG/ML IV SOLN
INTRAVENOUS | Status: DC | PRN
  Administered 2016-01-29: 40 mg via INTRAVENOUS

## 2016-01-29 MED ORDER — ACETAMINOPHEN 325 MG PO TABS
650.0000 mg | ORAL_TABLET | Freq: Four times a day (QID) | ORAL | Status: DC | PRN
  Administered 2016-01-30: 650 mg via ORAL
  Filled 2016-01-29: qty 2

## 2016-01-29 MED ORDER — FENTANYL CITRATE (PF) 100 MCG/2ML IJ SOLN: 25.0000 ug | INTRAMUSCULAR | Status: DC | PRN

## 2016-01-29 MED ORDER — FENTANYL CITRATE (PF) 100 MCG/2ML IJ SOLN
25.0000 ug | INTRAMUSCULAR | Status: DC | PRN
  Administered 2016-01-29: 25 ug via INTRAVENOUS

## 2016-01-29 MED ORDER — SODIUM CHLORIDE 0.9 % IV SOLN
INTRAVENOUS | Status: DC
  Administered 2016-01-29 – 2016-01-30 (×2): via INTRAVENOUS

## 2016-01-29 MED ORDER — CEFAZOLIN SODIUM-DEXTROSE 2-4 GM/100ML-% IV SOLN
2.0000 g | Freq: Four times a day (QID) | INTRAVENOUS | Status: AC
  Administered 2016-01-29 – 2016-01-30 (×2): 2 g via INTRAVENOUS
  Filled 2016-01-29 (×2): qty 100

## 2016-01-29 MED ORDER — ETOMIDATE 2 MG/ML IV SOLN
INTRAVENOUS | Status: AC
Start: 2016-01-29 — End: 2016-01-29
  Filled 2016-01-29: qty 10

## 2016-01-29 MED ORDER — METHOCARBAMOL 1000 MG/10ML IJ SOLN
500.0000 mg | Freq: Four times a day (QID) | INTRAVENOUS | Status: DC | PRN
  Filled 2016-01-29: qty 5

## 2016-01-29 MED ORDER — HYDROCODONE-ACETAMINOPHEN 5-325 MG PO TABS
1.0000 | ORAL_TABLET | Freq: Four times a day (QID) | ORAL | Status: DC | PRN
  Administered 2016-02-01: 2 via ORAL
  Filled 2016-01-29: qty 2

## 2016-01-29 MED ORDER — LACTATED RINGERS IV SOLN
INTRAVENOUS | Status: DC | PRN
  Administered 2016-01-29 (×2): via INTRAVENOUS

## 2016-01-29 MED ORDER — BISACODYL 10 MG RE SUPP: 10.0000 mg | Freq: Every day | RECTAL | Status: DC | PRN

## 2016-01-29 SURGICAL SUPPLY — 49 items
BAG ZIPLOCK 12X15 (MISCELLANEOUS) ×3 IMPLANT
BIT DRILL 2.8X128 (BIT) ×2 IMPLANT
BIT DRILL 2.8X128MM (BIT) ×1
BLADE EXTENDED COATED 6.5IN (ELECTRODE) ×3 IMPLANT
BLADE SAW SAG 73X25 THK (BLADE) ×2
BLADE SAW SGTL 73X25 THK (BLADE) ×1 IMPLANT
BRUSH FEMORAL CANAL (MISCELLANEOUS) ×3 IMPLANT
CAPT HIP HEMI 1 ×3 IMPLANT
CLOSURE WOUND 1/2 X4 (GAUZE/BANDAGES/DRESSINGS) ×1
DRAPE INCISE IOBAN 66X45 STRL (DRAPES) ×3 IMPLANT
DRAPE ORTHO SPLIT 77X108 STRL (DRAPES) ×4
DRAPE POUCH INSTRU U-SHP 10X18 (DRAPES) ×3 IMPLANT
DRAPE SURG ORHT 6 SPLT 77X108 (DRAPES) ×2 IMPLANT
DRAPE U-SHAPE 47X51 STRL (DRAPES) ×3 IMPLANT
DRSG EMULSION OIL 3X16 NADH (GAUZE/BANDAGES/DRESSINGS) ×3 IMPLANT
DRSG MEPILEX BORDER 4X12 (GAUZE/BANDAGES/DRESSINGS) ×3 IMPLANT
DRSG MEPILEX BORDER 4X4 (GAUZE/BANDAGES/DRESSINGS) ×3 IMPLANT
DRSG MEPILEX BORDER 4X8 (GAUZE/BANDAGES/DRESSINGS) ×3 IMPLANT
DURAPREP 26ML APPLICATOR (WOUND CARE) ×3 IMPLANT
ELECT REM PT RETURN 9FT ADLT (ELECTROSURGICAL) ×3
ELECTRODE REM PT RTRN 9FT ADLT (ELECTROSURGICAL) ×1 IMPLANT
EVACUATOR 1/8 PVC DRAIN (DRAIN) ×3 IMPLANT
FACESHIELD WRAPAROUND (MASK) ×9 IMPLANT
GAUZE SPONGE 4X4 12PLY STRL (GAUZE/BANDAGES/DRESSINGS) ×6 IMPLANT
GLOVE BIO SURGEON STRL SZ7.5 (GLOVE) ×3 IMPLANT
GLOVE BIO SURGEON STRL SZ8 (GLOVE) ×6 IMPLANT
GLOVE BIOGEL PI IND STRL 8 (GLOVE) ×1 IMPLANT
GLOVE BIOGEL PI INDICATOR 8 (GLOVE) ×2
GOWN STRL REUS W/TWL LRG LVL3 (GOWN DISPOSABLE) ×3 IMPLANT
GOWN STRL REUS W/TWL XL LVL3 (GOWN DISPOSABLE) ×3 IMPLANT
HANDPIECE INTERPULSE COAX TIP (DISPOSABLE) ×2
IMMOBILIZER KNEE 20 (SOFTGOODS)
IMMOBILIZER KNEE 20 THIGH 36 (SOFTGOODS) IMPLANT
KIT BASIN OR (CUSTOM PROCEDURE TRAY) ×3 IMPLANT
MANIFOLD NEPTUNE II (INSTRUMENTS) ×3 IMPLANT
PACK TOTAL JOINT (CUSTOM PROCEDURE TRAY) ×3 IMPLANT
PASSER SUT SWANSON 36MM LOOP (INSTRUMENTS) ×3 IMPLANT
POSITIONER SURGICAL ARM (MISCELLANEOUS) ×3 IMPLANT
PRESSURIZER FEMORAL UNIV (MISCELLANEOUS) ×3 IMPLANT
SET HNDPC FAN SPRY TIP SCT (DISPOSABLE) ×1 IMPLANT
STRIP CLOSURE SKIN 1/2X4 (GAUZE/BANDAGES/DRESSINGS) ×2 IMPLANT
SUT ETHIBOND NAB CT1 #1 30IN (SUTURE) ×6 IMPLANT
SUT MNCRL AB 4-0 PS2 18 (SUTURE) ×3 IMPLANT
SUT VIC AB 2-0 CT1 27 (SUTURE) ×4
SUT VIC AB 2-0 CT1 TAPERPNT 27 (SUTURE) ×2 IMPLANT
SUT VLOC 180 0 24IN GS25 (SUTURE) ×9 IMPLANT
TOWEL OR 17X26 10 PK STRL BLUE (TOWEL DISPOSABLE) ×6 IMPLANT
TOWEL OR NON WOVEN STRL DISP B (DISPOSABLE) ×3 IMPLANT
TOWER CARTRIDGE SMART MIX (DISPOSABLE) ×3 IMPLANT

## 2016-01-29 NOTE — Consult Note (Signed)
Reason for Consult:Right hip fracture Referring Physician: [name] is an 83 y.o. female.  HPI: 83 yo female resident of [facility] assisted living who has had falls in the past most recently a few days to a week ago. Was complaining of hip pain yesterday and inability to bear weight. Taken to ED where xrays revealed a displaced right femoral neck fracture. She does not recall any of the circumstances associated with the fall. She is alert and able to converse today but has a history of dementia. She was able to ambulate with a walker prior to this fall.  Past Medical History:  Diagnosis Date  . 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 Essential and other specified forms of tremor   . History of fall 09/12/2015  . Memory deficit 04/03/2013  . Nodular lesion on surface of skin 07/29/2013   Right flank. Black. About 54m diameter. Nodular.   . Other and unspecified hyperlipidemia   . Senile dementia, uncomplicated   . Urinary frequency 04/03/2013   Nocturnal, 4-5x/night.       Past Surgical History:  Procedure Laterality Date  . ABDOMINAL HYSTERECTOMY  1966  . CATARACT EXTRACTION Right 2013   Dr. HHerbert Deaner . excise skin cancer left upper arm Left 2013   Dr. KJari Pigg . TONSILLECTOMY      Family History  Problem Relation Age of Onset  . CAD Mother   . Diabetes Neg Hx   . Stroke Neg Hx   . Cancer Neg Hx   . Hypertension Neg Hx     Social History:  reports that she has never smoked. She has never used smokeless tobacco. She reports that she does not drink alcohol or use drugs.  Allergies: No Known Allergies  Medications: I have reviewed the patient's current medications.  Results for orders placed or performed during the hospital encounter of 01/28/16 (from the past 48 hour(s))  CBC with Differential/Platelet     Status: Abnormal   Collection Time: 01/28/16 11:58 PM  Result Value Ref Range   WBC 14.5 (H) 4.0 - 10.5 K/uL   RBC 3.67 (L) 3.87 - 5.11 MIL/uL   Hemoglobin 11.2 (L)  12.0 - 15.0 g/dL   HCT 33.7 (L) 36.0 - 46.0 %   MCV 91.8 78.0 - 100.0 fL   MCH 30.5 26.0 - 34.0 pg   MCHC 33.2 30.0 - 36.0 g/dL   RDW 13.4 11.5 - 15.5 %   Platelets 210 150 - 400 K/uL   Neutrophils Relative % 78 %   Neutro Abs 11.3 (H) 1.7 - 7.7 K/uL   Lymphocytes Relative 14 %   Lymphs Abs 2.0 0.7 - 4.0 K/uL   Monocytes Relative 8 %   Monocytes Absolute 1.2 (H) 0.1 - 1.0 K/uL   Eosinophils Relative 0 %   Eosinophils Absolute 0.0 0.0 - 0.7 K/uL   Basophils Relative 0 %   Basophils Absolute 0.0 0.0 - 0.1 K/uL  I-Stat Chem 8, ED     Status: Abnormal   Collection Time: 01/29/16 12:12 AM  Result Value Ref Range   Sodium 136 135 - 145 mmol/L   Potassium 4.0 3.5 - 5.1 mmol/L   Chloride 105 101 - 111 mmol/L   BUN 28 (H) 6 - 20 mg/dL   Creatinine, Ser 0.80 0.44 - 1.00 mg/dL   Glucose, Bld 174 (H) 65 - 99 mg/dL   Calcium, Ion 1.30 1.15 - 1.40 mmol/L   TCO2 21 0 - 100 mmol/L   Hemoglobin  11.6 (L) 12.0 - 15.0 g/dL   HCT 34.0 (L) 36.0 - 46.0 %  Troponin I (q 6hr x 3)     Status: None   Collection Time: 01/29/16  2:35 AM  Result Value Ref Range   Troponin I <0.03 <0.03 ng/mL  Type and screen     Status: None   Collection Time: 01/29/16  4:30 AM  Result Value Ref Range   ABO/RH(D) A POS    Antibody Screen NEG    Sample Expiration 02/01/2016   CBC     Status: Abnormal   Collection Time: 01/29/16  4:38 AM  Result Value Ref Range   WBC 14.7 (H) 4.0 - 10.5 K/uL   RBC 3.74 (L) 3.87 - 5.11 MIL/uL   Hemoglobin 11.3 (L) 12.0 - 15.0 g/dL   HCT 34.7 (L) 36.0 - 46.0 %   MCV 92.8 78.0 - 100.0 fL   MCH 30.2 26.0 - 34.0 pg   MCHC 32.6 30.0 - 36.0 g/dL   RDW 13.5 11.5 - 15.5 %   Platelets 226 150 - 400 K/uL  Basic metabolic panel     Status: Abnormal   Collection Time: 01/29/16  4:38 AM  Result Value Ref Range   Sodium 136 135 - 145 mmol/L   Potassium 3.8 3.5 - 5.1 mmol/L   Chloride 104 101 - 111 mmol/L   CO2 26 22 - 32 mmol/L   Glucose, Bld 117 (H) 65 - 99 mg/dL   BUN 26 (H) 6 - 20  mg/dL   Creatinine, Ser 0.85 0.44 - 1.00 mg/dL   Calcium 9.6 8.9 - 10.3 mg/dL   GFR calc non Af Amer 58 (L) >60 mL/min   GFR calc Af Amer >60 >60 mL/min    Comment: (NOTE) The eGFR has been calculated using the CKD EPI equation. This calculation has not been validated in all clinical situations. eGFR's persistently <60 mL/min signify possible Chronic Kidney Disease.    Anion gap 6 5 - 15  Albumin     Status: None   Collection Time: 01/29/16  4:38 AM  Result Value Ref Range   Albumin 3.6 3.5 - 5.0 g/dL    Dg Chest 1 View  Result Date: 01/29/2016 CLINICAL DATA:  83 year old female with fall and right hip injury. EXAM: CHEST 1 VIEW COMPARISON:  None. FINDINGS: The lungs are clear. There is no pleural effusion or pneumothorax. The cardiac silhouette is within normal limits. There is atherosclerotic calcification of the aortic arch. There is osteopenia with degenerative changes of the spine and shoulders. No acute fracture. IMPRESSION: No active disease. Electronically Signed   By: Debra [name] M.D.   On: 01/29/2016 00:37   Ct Head Wo Contrast  Result Date: 01/29/2016 CLINICAL DATA:  83 year old female with fall. EXAM: CT HEAD WITHOUT CONTRAST CT CERVICAL SPINE WITHOUT CONTRAST TECHNIQUE: Multidetector CT imaging of the head and cervical spine was performed following the standard protocol without intravenous contrast. Multiplanar CT image reconstructions of the cervical spine were also generated. COMPARISON:  None. FINDINGS: CT HEAD FINDINGS Brain: There is mild age-related atrophy and chronic microvascular ischemic changes. There is no acute intracranial hemorrhage. No mass effect or midline shift noted. No extra-axial fluid collection. There is a 7 x 10 mm dural based right anterior parafalcine extra-axial lesion most compatible with a meningioma. Vascular: No hyperdense vessel or unexpected calcification. Skull: Normal. Negative for fracture or focal lesion. Sinuses/Orbits: No acute  finding. Other: None CT CERVICAL SPINE FINDINGS Alignment: No acute subluxation. There  is reversal of normal cervical lordosis most prominent at C4-C5. This may be related to muscle spasm or degenerative changes. Skull base and vertebrae: No acute fracture. No primary bone lesion or focal pathologic process. Soft tissues and spinal canal: Bilateral intra parotid lesions measuring up to 18 x 18 mm on the left. Ultrasound may provide better evaluation. There is heterogeneity of the thyroid gland with an 11 x 8 mm right lobe nodule. Disc levels: Multilevel degenerative changes. There is grade 1 C4-C5 anterolisthesis. Disc space narrowing most prominent at C5-C6 and C6-C7. Upper chest: Mild interlobular septal thickening may represent a degree of interstitial edema. Other: Atherosclerotic calcification of the aortic arch. There is mild asymmetric dilatation of the right internal jugular vein. IMPRESSION: No acute intracranial hemorrhage. Age-related atrophy and chronic microvascular ischemic changes. Small right anterior parafalcine meningioma. No acute/traumatic cervical spine pathology. Electronically Signed   By: Debra [name] M.D.   On: 01/29/2016 02:27   Ct Cervical Spine Wo Contrast  Result Date: 01/29/2016 CLINICAL DATA:  83 year old female with fall. EXAM: CT HEAD WITHOUT CONTRAST CT CERVICAL SPINE WITHOUT CONTRAST TECHNIQUE: Multidetector CT imaging of the head and cervical spine was performed following the standard protocol without intravenous contrast. Multiplanar CT image reconstructions of the cervical spine were also generated. COMPARISON:  None. FINDINGS: CT HEAD FINDINGS Brain: There is mild age-related atrophy and chronic microvascular ischemic changes. There is no acute intracranial hemorrhage. No mass effect or midline shift noted. No extra-axial fluid collection. There is a 7 x 10 mm dural based right anterior parafalcine extra-axial lesion most compatible with a meningioma. Vascular: No  hyperdense vessel or unexpected calcification. Skull: Normal. Negative for fracture or focal lesion. Sinuses/Orbits: No acute finding. Other: None CT CERVICAL SPINE FINDINGS Alignment: No acute subluxation. There is reversal of normal cervical lordosis most prominent at C4-C5. This may be related to muscle spasm or degenerative changes. Skull base and vertebrae: No acute fracture. No primary bone lesion or focal pathologic process. Soft tissues and spinal canal: Bilateral intra parotid lesions measuring up to 18 x 18 mm on the left. Ultrasound may provide better evaluation. There is heterogeneity of the thyroid gland with an 11 x 8 mm right lobe nodule. Disc levels: Multilevel degenerative changes. There is grade 1 C4-C5 anterolisthesis. Disc space narrowing most prominent at C5-C6 and C6-C7. Upper chest: Mild interlobular septal thickening may represent a degree of interstitial edema. Other: Atherosclerotic calcification of the aortic arch. There is mild asymmetric dilatation of the right internal jugular vein. IMPRESSION: No acute intracranial hemorrhage. Age-related atrophy and chronic microvascular ischemic changes. Small right anterior parafalcine meningioma. No acute/traumatic cervical spine pathology. Electronically Signed   By: Debra [name] M.D.   On: 01/29/2016 02:27   Dg Hip Unilat W Or Wo Pelvis 2-3 Views Right  Result Date: 01/29/2016 CLINICAL DATA:  83 year old female with fall and right hip pain. EXAM: DG HIP (WITH OR WITHOUT PELVIS) 2-3V RIGHT COMPARISON:  Abdominal radiograph dated 11/27/2008 FINDINGS: There is a mildly displaced fracture of the right femoral neck with mild proximal migration of the femoral shaft in relation to the head of the femur. The femoral head remains in anatomic alignment with the acetabulum. There is no dislocation. The bones are osteopenic. The soft tissues appear unremarkable. IMPRESSION: Mildly displaced fracture of the right femoral neck. Electronically Signed    By: Debra [name] M.D.   On: 01/29/2016 00:36    ROS Blood pressure (!) 139/52, pulse 74, temperature 98.2 F (36.8 C), temperature  source Oral, resp. rate 15, height _0  (1.6 m), weight 55.6 kg (122 lb 9.2 oz), SpO2 97 %. Physical Exam Physical Examination: General appearance - alert, well appearing, and in no distress Mental status - alert, oriented to person, RLE shortened and externally rotated, able to wiggle toes and move foot, pulses trace palpable and foot is warm; sensation intact; knee exam normal; tender lateral hip with pain on any attempted hip motion  Assessment/Plan: Right femoral neck fracture- Will require operative treatment in the form of hemiarthroplasty in order to allow for pain control and possible restoration of ambulatory status.I have discussed this with the patient and she desires to proceed with surgery  Debra [name] 01/29/2016, 7:04 AM

## 2016-01-29 NOTE — Progress Notes (Signed)
PROGRESS NOTE    Debra [name]  [id number] DOB: 04-02-1921 DOA: 01/28/2016 PCP: Jeanmarie Hubert, MD    Brief Narrative: Debra [name] is a 83 y.o. female with medical history significant of  Dementia HLD and zoster  presented with hip pain patient had a fall 1-3 days ago. She was found to have a rght hip fracture. Orthopedics consulted and scheduled to undergo a hemiarthroplasty today.   Assessment & Plan:   Principal Problem:   Fracture of femoral neck, right (HCC) Active Problems:   Leukocytosis   Dementia   Closed right hip fracture (HCC)   Abnormal ECG   Right femoral neck fracture:  Admitted for surgical repair. Consulted orthopedics. Plans to undergo bipolar hip hemiarthroplasty.  Pain control . gnetle hydration.  Discussed with son at bedside.    Dementia: no agitation.    Abnormal EKG, ekg from 2016 has the same non specific repolarization changes. Currently she denies any chest pain.  Would not pursue any enzymes, can d/c telemetry. She would not be candidate for aggressive management. No echo on file.    Leukocytosis:  ? Reactive. UA IS negative.  No URI symptoms. CXR Is clear.  Monitor.         DVT prophylaxis: (Lovenox Code Status: (Full Family Communication: son at bedside.  Disposition Plan: pending further eval.   Consultants:   Orthopedics.    Procedures: right hemiarthroplasty.   Antimicrobials: none.     Subjective: She reports feeling warm. No new complaints. No pain   Objective: Vitals:   01/28/16 2349 01/29/16 0058 01/29/16 0301 01/29/16 0359  BP:  (!) 140/54 140/70 (!) 139/52  Pulse:  85 80 74  Resp:  14 15   Temp:    98.2 F (36.8 C)  TempSrc:    Oral  SpO2: 94% 92% 98% 97%  Weight:    55.6 kg (122 lb 9.2 oz)  Height:    5\' 3"  (1.6 m)    Intake/Output Summary (Last 24 hours) at 01/29/16 1021 Last data filed at 01/29/16 0846  Gross per 24 hour  Intake            33.75 ml  Output              400 ml  Net           -366.25 ml   Filed Weights   01/28/16 2252 01/29/16 0359  Weight: 56.7 kg (125 lb) 55.6 kg (122 lb 9.2 oz)    Examination:  General exam: Appears calm and comfortable  Respiratory system: Clear to auscultation. Respiratory effort normal. Cardiovascular system: S1 & S2 heard, RRR. No JVD, murmurs, rubs, gallops or clicks. No pedal edema. Gastrointestinal system: Abdomen is nondistended, soft and nontender. No organomegaly or masses felt. Normal bowel sounds heard. Central nervous system: Alert and confused. . Extremities: painful ROM of the right lower extremity.  Psychiatry: calm and pleasant.     Data Reviewed: I have personally reviewed following labs and imaging studies  CBC:  Recent Labs Lab 01/28/16 2358 01/29/16 0012 01/29/16 0438  WBC 14.5*  --  14.7*  NEUTROABS 11.3*  --   --   HGB 11.2* 11.6* 11.3*  HCT 33.7* 34.0* 34.7*  MCV 91.8  --  92.8  PLT 210  --  [id number]   Basic Metabolic Panel:  Recent Labs Lab 01/29/16 0012 01/29/16 0438  NA 136 136  K 4.0 3.8  CL 105 104  CO2  --  26  GLUCOSE 174* 117*  BUN 28* 26*  CREATININE 0.80 0.85  CALCIUM  --  9.6   GFR: Estimated Creatinine Clearance: 34.9 mL/min (by C-G formula based on SCr of 0.85 mg/dL). Liver Function Tests:  Recent Labs Lab 01/29/16 0438  ALBUMIN 3.6   No results for input(s): LIPASE, AMYLASE in the last 168 hours. No results for input(s): AMMONIA in the last 168 hours. Coagulation Profile: No results for input(s): INR, PROTIME in the last 168 hours. Cardiac Enzymes:  Recent Labs Lab 01/29/16 0235  TROPONINI <0.03   BNP (last 3 results) No results for input(s): PROBNP in the last 8760 hours. HbA1C: No results for input(s): HGBA1C in the last 72 hours. CBG: No results for input(s): GLUCAP in the last 168 hours. Lipid Profile: No results for input(s): CHOL, HDL, LDLCALC, TRIG, CHOLHDL, LDLDIRECT in the last 72 hours. Thyroid Function Tests: No results for input(s): TSH, T4TOTAL,  FREET4, T3FREE, THYROIDAB in the last 72 hours. Anemia Panel: No results for input(s): VITAMINB12, FOLATE, FERRITIN, TIBC, IRON, RETICCTPCT in the last 72 hours. Sepsis Labs: No results for input(s): PROCALCITON, LATICACIDVEN in the last 168 hours.  Recent Results (from the past 240 hour(s))  Surgical PCR screen     Status: None   Collection Time: 01/29/16  6:42 AM  Result Value Ref Range Status   MRSA, PCR NEGATIVE NEGATIVE Final   Staphylococcus aureus NEGATIVE NEGATIVE Final    Comment:        The Xpert SA Assay (FDA approved for NASAL specimens in patients over 69 years of age), is one component of a comprehensive surveillance program.  Test performance has been validated by [facility] for patients greater than or equal to 43 year old. It is not intended to diagnose infection nor to guide or monitor treatment.          Radiology Studies: Dg Chest 1 View  Result Date: 01/29/2016 CLINICAL DATA:  82 year old female with fall and right hip injury. EXAM: CHEST 1 VIEW COMPARISON:  None. FINDINGS: The lungs are clear. There is no pleural effusion or pneumothorax. The cardiac silhouette is within normal limits. There is atherosclerotic calcification of the aortic arch. There is osteopenia with degenerative changes of the spine and shoulders. No acute fracture. IMPRESSION: No active disease. Electronically Signed   By: Anner Crete M.D.   On: 01/29/2016 00:37   Ct Head Wo Contrast  Result Date: 01/29/2016 CLINICAL DATA:  83 year old female with fall. EXAM: CT HEAD WITHOUT CONTRAST CT CERVICAL SPINE WITHOUT CONTRAST TECHNIQUE: Multidetector CT imaging of the head and cervical spine was performed following the standard protocol without intravenous contrast. Multiplanar CT image reconstructions of the cervical spine were also generated. COMPARISON:  None. FINDINGS: CT HEAD FINDINGS Brain: There is mild age-related atrophy and chronic microvascular ischemic changes. There is no  acute intracranial hemorrhage. No mass effect or midline shift noted. No extra-axial fluid collection. There is a 7 x 10 mm dural based right anterior parafalcine extra-axial lesion most compatible with a meningioma. Vascular: No hyperdense vessel or unexpected calcification. Skull: Normal. Negative for fracture or focal lesion. Sinuses/Orbits: No acute finding. Other: None CT CERVICAL SPINE FINDINGS Alignment: No acute subluxation. There is reversal of normal cervical lordosis most prominent at C4-C5. This may be related to muscle spasm or degenerative changes. Skull base and vertebrae: No acute fracture. No primary bone lesion or focal pathologic process. Soft tissues and spinal canal: Bilateral intra parotid lesions measuring up to 18 x 18 mm on the left. Ultrasound may provide  better evaluation. There is heterogeneity of the thyroid gland with an 11 x 8 mm right lobe nodule. Disc levels: Multilevel degenerative changes. There is grade 1 C4-C5 anterolisthesis. Disc space narrowing most prominent at C5-C6 and C6-C7. Upper chest: Mild interlobular septal thickening may represent a degree of interstitial edema. Other: Atherosclerotic calcification of the aortic arch. There is mild asymmetric dilatation of the right internal jugular vein. IMPRESSION: No acute intracranial hemorrhage. Age-related atrophy and chronic microvascular ischemic changes. Small right anterior parafalcine meningioma. No acute/traumatic cervical spine pathology. Electronically Signed   By: Anner Crete M.D.   On: 01/29/2016 02:27   Ct Cervical Spine Wo Contrast  Result Date: 01/29/2016 CLINICAL DATA:  83 year old female with fall. EXAM: CT HEAD WITHOUT CONTRAST CT CERVICAL SPINE WITHOUT CONTRAST TECHNIQUE: Multidetector CT imaging of the head and cervical spine was performed following the standard protocol without intravenous contrast. Multiplanar CT image reconstructions of the cervical spine were also generated. COMPARISON:  None.  FINDINGS: CT HEAD FINDINGS Brain: There is mild age-related atrophy and chronic microvascular ischemic changes. There is no acute intracranial hemorrhage. No mass effect or midline shift noted. No extra-axial fluid collection. There is a 7 x 10 mm dural based right anterior parafalcine extra-axial lesion most compatible with a meningioma. Vascular: No hyperdense vessel or unexpected calcification. Skull: Normal. Negative for fracture or focal lesion. Sinuses/Orbits: No acute finding. Other: None CT CERVICAL SPINE FINDINGS Alignment: No acute subluxation. There is reversal of normal cervical lordosis most prominent at C4-C5. This may be related to muscle spasm or degenerative changes. Skull base and vertebrae: No acute fracture. No primary bone lesion or focal pathologic process. Soft tissues and spinal canal: Bilateral intra parotid lesions measuring up to 18 x 18 mm on the left. Ultrasound may provide better evaluation. There is heterogeneity of the thyroid gland with an 11 x 8 mm right lobe nodule. Disc levels: Multilevel degenerative changes. There is grade 1 C4-C5 anterolisthesis. Disc space narrowing most prominent at C5-C6 and C6-C7. Upper chest: Mild interlobular septal thickening may represent a degree of interstitial edema. Other: Atherosclerotic calcification of the aortic arch. There is mild asymmetric dilatation of the right internal jugular vein. IMPRESSION: No acute intracranial hemorrhage. Age-related atrophy and chronic microvascular ischemic changes. Small right anterior parafalcine meningioma. No acute/traumatic cervical spine pathology. Electronically Signed   By: Anner Crete M.D.   On: 01/29/2016 02:27   Dg Hip Unilat W Or Wo Pelvis 2-3 Views Right  Result Date: 01/29/2016 CLINICAL DATA:  83 year old female with fall and right hip pain. EXAM: DG HIP (WITH OR WITHOUT PELVIS) 2-3V RIGHT COMPARISON:  Abdominal radiograph dated 11/27/2008 FINDINGS: There is a mildly displaced fracture of the  right femoral neck with mild proximal migration of the femoral shaft in relation to the head of the femur. The femoral head remains in anatomic alignment with the acetabulum. There is no dislocation. The bones are osteopenic. The soft tissues appear unremarkable. IMPRESSION: Mildly displaced fracture of the right femoral neck. Electronically Signed   By: Anner Crete M.D.   On: 01/29/2016 00:36        Scheduled Meds: Continuous Infusions: . sodium chloride 75 mL/hr at 01/29/16 0433     LOS: 0 days    Time spent: 30 min  Avionna Bower, MD [facility] Pager [phone]   If 7PM-7AM, please contact night-coverage [email or web address] Password [facility] 01/29/2016, 10:21 AM

## 2016-01-29 NOTE — Interval H&P Note (Signed)
History and Physical Interval Note:  [id number] PM  Debra [name]  has presented today for surgery, with the diagnosis of right hip fracture  The various methods of treatment have been discussed with the patient and family. After consideration of risks, benefits and other options for treatment, the patient has consented to  Procedure(s): ARTHROPLASTY BIPOLAR HIP (HEMIARTHROPLASTY) (Right) as a surgical intervention .  The patient's history has been reviewed, patient examined, no change in status, stable for surgery.  I have reviewed the patient's chart and labs.  Questions were answered to the patient's satisfaction.     Gearlean Alf

## 2016-01-29 NOTE — H&P (Addendum)
Debra [name] [id number] DOB: 12-26-1921 DOA: 01/28/2016     PCP: Jeanmarie Hubert, MD   Outpatient Specialists: none Patient coming from: [facility]     Chief Complaint: Hip pain  HPI: Debra [name] is a 83 y.o. female with medical history significant of  Dementia HLD and zoster    Presented with hip pain patient had a fall 1-3 days ago it's unclear if the fall was a weakness and not. She also remembers she had a time when she tried to sit and felt her hip pop. She is unsteady on her feet at baseline and falls frequently.  She's been continued to have right hip pain for past few days. Patient has significant history of dementia unable to provide her own history unclear if there was any loss of consciousness at the time of her fall . Daughter noticed the patient had a wound on her head and requested that patient be transferred for further evaluation   Regarding pertinent Chronic problems: She has long-standing history of dementia she is able to recognize her family but has short term memory loss pleasantly confused at baseline at her baseline and frequent UTIs Has known history of tremor  IN ER:  Temp (24hrs), Avg:98.6 F (37 C), Min:98.6 F (37 C), Max:98.6 F (37 C)     RR 14 HR 85 BP 140/54 Sodium 136 potassium 4.03 0.80 hemoglobin 11.2 WBC 14.5 Chest x-ray non-acute, Hip films showing mildly displaced fracture of right femoral neck Following Medications were ordered in ER:    ER provider discussed case with:  Dr. Maureen Ralphs will see patient in consult in the morning plans for nothing by mouth post midnight for probable repair  Hospitalist was called for admission for right hip fracture in a setting of fall  Review of Systems: unable to obtain secondary to severe dementia   Pertinent positives include:  Past Medical History: Past Medical History:  Diagnosis Date  . 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 Essential and other specified forms of tremor   . History of fall  09/12/2015  . Memory deficit 04/03/2013  . Nodular lesion on surface of skin 07/29/2013   Right flank. Black. About 3mm diameter. Nodular.   . Other and unspecified hyperlipidemia   . Senile dementia, uncomplicated   . Urinary frequency 04/03/2013   Nocturnal, 4-5x/night.      Past Surgical History:  Procedure Laterality Date  . ABDOMINAL HYSTERECTOMY  1966  . CATARACT EXTRACTION Right 2013   Dr. Herbert Deaner  . excise skin cancer left upper arm Left 2013   Dr. Jari Pigg  . TONSILLECTOMY       Social History:  Ambulatory   walker       reports that she has never smoked. She has never used smokeless tobacco. She reports that she does not drink alcohol or use drugs.  Allergies:  No Known Allergies     Family History:   Family History  Problem Relation Age of Onset  . CAD Mother   . Diabetes Neg Hx   . Stroke Neg Hx   . Cancer Neg Hx   . Hypertension Neg Hx     Medications: Prior to Admission medications   Medication Sig Start Date End Date Taking? Authorizing Provider  gabapentin (NEURONTIN) 100 MG capsule Take 1 capsule (100 mg total) by mouth 3 (three) times daily as needed. 01/11/16   Lauree Chandler, NP  valACYclovir (VALTREX) 1000 MG tablet Take 1 tablet (1,000 mg total) by mouth 2 (  two) times daily. 01/11/16   Lauree Chandler, NP    Physical Exam: Patient Vitals for the past 24 hrs:  BP Temp Temp src Pulse Resp SpO2 Height Weight  01/29/16 0058 (!) 140/54 - - 85 14 92 % - -  01/28/16 2349 - - - - - 94 % - -  01/28/16 2252 - - - - - - 5\' 3"  (1.6 m) 56.7 kg (125 lb)  01/28/16 2247 169/84 98.6 F (37 C) Oral 86 18 94 % - -  01/28/16 2243 - - - - - 98 % - -    1. General:  in No Acute distress 2. Psychological: resting comfortably  t not  Oriented 3. Head/ENT:     Dry Mucous Membranes                          Head Non traumatic, neck supple                            Poor Dentition 4. SKIN  decreased Skin turgor,  Skin clean Dry and intact no rash 5. Heart:  Regular rate and rhythm no  Murmur, Rub or gallop 6. Lungs: Clear to auscultation bilaterally, no wheezes or crackles   7. Abdomen: Soft,  non-tender, Non distended 8. Lower extremities: no clubbing, cyanosis, or edema 9. Neurologically Grossly intact, moving all 4 extremities equally    10. MSK: Normal range of motion except for right hip secondary to pain   body mass index is 22.14 kg/m.  Labs on Admission:   Labs on Admission: I have personally reviewed following labs and imaging studies  CBC:  Recent Labs Lab 01/28/16 2358 01/29/16 0012  WBC 14.5*  --   NEUTROABS 11.3*  --   HGB 11.2* 11.6*  HCT 33.7* 34.0*  MCV 91.8  --   PLT 210  --    Basic Metabolic Panel:  Recent Labs Lab 01/29/16 0012  NA 136  K 4.0  CL 105  GLUCOSE 174*  BUN 28*  CREATININE 0.80   GFR: Estimated Creatinine Clearance: 37.1 mL/min (by C-G formula based on SCr of 0.8 mg/dL). Liver Function Tests: No results for input(s): AST, ALT, ALKPHOS, BILITOT, PROT, ALBUMIN in the last 168 hours. No results for input(s): LIPASE, AMYLASE in the last 168 hours. No results for input(s): AMMONIA in the last 168 hours. Coagulation Profile: No results for input(s): INR, PROTIME in the last 168 hours. Cardiac Enzymes: No results for input(s): CKTOTAL, CKMB, CKMBINDEX, TROPONINI in the last 168 hours. BNP (last 3 results) No results for input(s): PROBNP in the last 8760 hours. HbA1C: No results for input(s): HGBA1C in the last 72 hours. CBG: No results for input(s): GLUCAP in the last 168 hours. Lipid Profile: No results for input(s): CHOL, HDL, LDLCALC, TRIG, CHOLHDL, LDLDIRECT in the last 72 hours. Thyroid Function Tests: No results for input(s): TSH, T4TOTAL, FREET4, T3FREE, THYROIDAB in the last 72 hours. Anemia Panel: No results for input(s): VITAMINB12, FOLATE, FERRITIN, TIBC, IRON, RETICCTPCT in the last 72 hours.  Sepsis Labs: @LABRCNTIP (procalcitonin:4,lacticidven:4) )No results found for  this or any previous visit (from the past 240 hour(s)).     UA ordered  No results found for: HGBA1C  Estimated Creatinine Clearance: 37.1 mL/min (by C-G formula based on SCr of 0.8 mg/dL).  BNP (last 3 results) No results for input(s): PROBNP in the last 8760 hours.   ECG REPORT  Independently reviewed Rate: 86  Rhythm: NSR ST&T Change: Abnormal R-wave progression nonspecific ST depressions in multiple leads    QTC 432  Filed Weights   01/28/16 2252  Weight: 56.7 kg (125 lb)     Cultures:    Component Value Date/Time   SDES URINE, CLEAN CATCH 10/15/2014 1030   SPECREQUEST NONE 10/15/2014 1030   CULT  10/15/2014 1030    MULTIPLE SPECIES PRESENT, SUGGEST RECOLLECTION Performed at [facility] 10/16/2014 FINAL 10/15/2014 1030     Radiological Exams on Admission: Dg Chest 1 View  Result Date: 01/29/2016 CLINICAL DATA:  83 year old female with fall and right hip injury. EXAM: CHEST 1 VIEW COMPARISON:  None. FINDINGS: The lungs are clear. There is no pleural effusion or pneumothorax. The cardiac silhouette is within normal limits. There is atherosclerotic calcification of the aortic arch. There is osteopenia with degenerative changes of the spine and shoulders. No acute fracture. IMPRESSION: No active disease. Electronically Signed   By: Anner Crete M.D.   On: 01/29/2016 00:37   Dg Hip Unilat W Or Wo Pelvis 2-3 Views Right  Result Date: 01/29/2016 CLINICAL DATA:  83 year old female with fall and right hip pain. EXAM: DG HIP (WITH OR WITHOUT PELVIS) 2-3V RIGHT COMPARISON:  Abdominal radiograph dated 11/27/2008 FINDINGS: There is a mildly displaced fracture of the right femoral neck with mild proximal migration of the femoral shaft in relation to the head of the femur. The femoral head remains in anatomic alignment with the acetabulum. There is no dislocation. The bones are osteopenic. The soft tissues appear unremarkable. IMPRESSION: Mildly displaced  fracture of the right femoral neck. Electronically Signed   By: Anner Crete M.D.   On: 01/29/2016 00:36    Chart has been reviewed    Assessment/Plan  83 y.o. female with medical history significant of  Dementia HLD and zoster admitted for right hip fracture  Possibly secondary to fall versus spontaneous  Present on Admission: . Closed right hip fracture [facility]) appreciate orthopedics consult keep nothing by mouth orthopedics plan to see patient in the morning Given advanced age patient has moderate to high risk. She is not endorse any chest pain or shortness of breath no evidence of decompensated heart failure given abnormal ECG will monitor on tele and check cardiac enzymes if unremarkable no further work up indicated prior to OR. Abnormal ECG -chest pain free, given advance age and inability to provide good history will observe on tele and cycle CE  . Dementia long-standing expect some degree of sundowning while hospitalized Fall - with possible head injury obtain CT of the head to rule out any intracranial process . Leukocytosis Likely stress reaction but will obtain UA to evaluate for any evidence of UTI chest x-ray unremarkable  Other plan as per orders.  DVT prophylaxis:  SCD    Code Status:    DNR/DNI   as per  family   Family Communication:   Family   at  Bedside  plan of care was discussed with   Daughter, and son in law Disposition Plan:                              Back to current facility when stable  Would benefit from PT/OT eval prior to DC  please order once hip has been repaired                       Social Work   Consults called: Dr. Raphael Gibney with orthopedics   Admission status:    inpatient      Level of care medical floor           I have spent a total of 56 min on this admission   Debra [name] 01/29/2016, 1:29 AM    [facility]  Pager [phone]   after 2 AM please page floor coverage PA If  7AM-7PM, please contact the day team taking care of the patient  [email or web address]  Password [email or web address]

## 2016-01-29 NOTE — Anesthesia Procedure Notes (Signed)
Procedure Name: Intubation Date/Time: 01/29/2016 4:07 PM Performed by: Glory Buff Pre-anesthesia Checklist: Patient identified, Emergency Drugs available, Suction available and Patient being monitored Patient Re-evaluated:Patient Re-evaluated prior to inductionOxygen Delivery Method: Circle system utilized Preoxygenation: Pre-oxygenation with 100% oxygen Intubation Type: IV induction Ventilation: Mask ventilation without difficulty Laryngoscope Size: Miller and 3 Grade View: Grade I Tube type: Oral Tube size: 7.0 mm Number of attempts: 1 Airway Equipment and Method: Stylet and Oral airway Placement Confirmation: ETT inserted through vocal cords under direct vision,  positive ETCO2 and breath sounds checked- equal and bilateral Secured at: 20 cm Tube secured with: Tape Dental Injury: Teeth and Oropharynx as per pre-operative assessment

## 2016-01-29 NOTE — Op Note (Signed)
OPERATIVE REPORT   PREOPERATIVE DIAGNOSIS:  Right femoral neck fracture displaced.   POSTOPERATIVE DIAGNOSIS:  Right femoral neck fracture displaced.   PROCEDURE:  Right hip hemiarthroplasty.   SURGEON: Gaynelle Arabian, M.D.   ASSISTANT: Arlee Muslim, PA-C  ANESTHESIA: General  ESTIMATED BLOOD LOSS:100 ml    DRAINS: Hemovac x1.   COMPLICATIONS: None.   CONDITION: Stable to recovery.   BRIEF CLINICAL NOTE: Emerita Bonis is an 83 y.o. female  who had a fall last week and presented to the ED yesterday with a displaced  Right femoral neck fracture. The patient was evaluated in the emergency department and admitted to the medical service. The patient was cleared for surgery and presents for right hip hemiarthroplasty.   PROCEDURE IN DETAIL: After successful administration of  General anesthetic, the patient was placed in the Left lateral decubitus position with the Right side up and held with the hip positioner.Right lower extremity was isolated from the perineum with plastic drapes and prepped and draped in the usual sterile fashion.       Short posterolateral incision was made with a 10 blade through the subcutaneous tissue to the level of fascia lata which was incised in line with the skin incision.  The sciatic nerve was palpated and protected, and short rotators and  capsule were isolated off the femur. There was complete fracture of the  femoral neck. The femoral head was removed. Diameter is 42 mm. We then  isolated the femur with retractors and freshened up the femoral neck cut  with an oscillating saw. I used a starter reamer to get into the  femoral canal and then irrigated with saline. A lateralizing reamer was  then placed. We then broached up to a size 4, which had excellent  rotational and torsional stability. Trial neck for the size 4 with a  28 + 1.5 head and 42 bipolar was placed. Hip was reduced with excellent  stability. I placed theRight  leg on top of the Left leg, and  lengths equal. Hip was dislocated. All trials were removed.         The size 4 [place] [place] basic  pressfit stem was then impacted in about 20 degrees of  Anteversion with excellent torsional and axial stability.  the permanent 28 + 1.5  head and 42 bipolar components were placed. Hip was reduced with the same stability parameters. Capsule and short rotators reattached to the  femur through drill holes with Ethibond suture. Fascia lata was closed  over Hemovac drain with running #1 V-Loc suture. Subcu closed with 2-0  Vicryl, and subcuticular running 4-0 Monocryl. The drains were hooked  to suction. Incision was cleaned and dried and Steri-Strips and a bulky  sterile dressing applied. The limb was then placed into a knee immobilizer, and the patient awakened and transported to recovery in stable condition.   Gaynelle Arabian, M.D.

## 2016-01-29 NOTE — Progress Notes (Signed)
CRITICAL VALUE ALERT  Critical value received:  Troponin 0.04  Date of notification:  01/29/16  Time of notification:  1428  Critical value read back:  Yes  Nurse who received alert:  Janiece Scovill  MD notified (1st page):  Karleen Hampshire  Time of first page: 1434  MD notified (2nd page):  Time of second page:  Responding MD:    Time MD responded:

## 2016-01-29 NOTE — Transfer of Care (Signed)
Immediate Anesthesia Transfer of Care Note  Patient: Debra [name]  Procedure(s) Performed: Procedure(s): ARTHROPLASTY BIPOLAR HIP (HEMIARTHROPLASTY) (Right)  Patient Location: PACU  Anesthesia Type:General  Level of Consciousness: awake, alert , oriented and patient cooperative  Airway & Oxygen Therapy: Patient Spontanous Breathing and Patient connected to face mask oxygen  Post-op Assessment: Report given to RN, Post -op Vital signs reviewed and stable and Patient moving all extremities X 4  Post vital signs: stable  Last Vitals:  Vitals:   01/29/16 0301 01/29/16 0359  BP: 140/70 (!) 139/52  Pulse: 80 74  Resp: 15   Temp:  36.8 C    Last Pain:  Vitals:   01/29/16 0359  TempSrc: Oral         Complications: No apparent anesthesia complications

## 2016-01-29 NOTE — Anesthesia Postprocedure Evaluation (Signed)
Anesthesia Post Note  Patient: Debra [name]  Procedure(s) Performed: Procedure(s) (LRB): ARTHROPLASTY BIPOLAR HIP (HEMIARTHROPLASTY) (Right)  Patient location during evaluation: PACU Anesthesia Type: General Level of consciousness: awake Pain management: pain level controlled Vital Signs Assessment: post-procedure vital signs reviewed and stable Respiratory status: spontaneous breathing Cardiovascular status: stable Anesthetic complications: no       Last Vitals:  Vitals:   01/29/16 1827 01/29/16 2153  BP: 117/65 124/80  Pulse: 86 95  Resp: 16 18  Temp: 37.2 C 36.9 C    Last Pain:  Vitals:   01/29/16 2153  TempSrc: Oral  PainSc:                  [name]

## 2016-01-29 NOTE — H&P (View-Only) (Signed)
Reason for Consult:Right hip fracture Referring Physician: Dr. Robina Debra [name] is an 83 y.o. female.  HPI: 83 yo female resident of [facility] assisted living who has had falls in the past most recently a few days to a week ago. Was complaining of hip pain yesterday and inability to bear weight. Taken to ED where xrays revealed a displaced right femoral neck fracture. She does not recall any of the circumstances associated with the fall. She is alert and able to converse today but has a history of Debra [name]. She was able to ambulate with a walker prior to this fall.  Past Medical History:  Diagnosis Date  . Debra Debra [name] 07/29/2013  . Essential and other specified forms of tremor   . History of fall 09/12/2015  . Debra Debra [name] 04/03/2013  . Nodular lesion on surface of skin 07/29/2013   Right flank. Black. About 54m diameter. Nodular.   . Other and unspecified hyperlipidemia   . Debra Debra [name], Debra Debra [name]   . Urinary frequency 04/03/2013   Nocturnal, 4-5x/night.       Past Surgical History:  Procedure Laterality Date  . ABDOMINAL HYSTERECTOMY  1966  . CATARACT EXTRACTION Right 2013   Dr. HHerbert Deaner . excise skin cancer left upper arm Left 2013   Dr. KJari Pigg . TONSILLECTOMY      Family History  Problem Relation Age of Onset  . CAD Mother   . Diabetes Neg Hx   . Stroke Neg Hx   . Cancer Neg Hx   . Hypertension Neg Hx     Social History:  reports that she has never smoked. She has never used smokeless tobacco. She reports that she does not drink alcohol or use drugs.  Allergies: No Known Allergies  Medications: I have reviewed the patient's current medications.  Results for orders placed or performed during the hospital encounter of 01/28/16 (from the past 48 hour(s))  CBC with Differential/Platelet     Status: Abnormal   Collection Time: 01/28/16 11:58 PM  Result Value Ref Range   WBC 14.5 (H) 4.0 - 10.5 K/uL   RBC 3.67 (L) 3.87 - 5.11 MIL/uL   Hemoglobin 11.2 (L)  12.0 - 15.0 g/dL   HCT 33.7 (L) 36.0 - 46.0 %   MCV 91.8 78.0 - 100.0 fL   MCH 30.5 26.0 - 34.0 pg   MCHC 33.2 30.0 - 36.0 g/dL   RDW 13.4 11.5 - 15.5 %   Platelets 210 150 - 400 K/uL   Neutrophils Relative % 78 %   Neutro Abs 11.3 (H) 1.7 - 7.7 K/uL   Lymphocytes Relative 14 %   Lymphs Abs 2.0 0.7 - 4.0 K/uL   Monocytes Relative 8 %   Monocytes Absolute 1.2 (H) 0.1 - 1.0 K/uL   Eosinophils Relative 0 %   Eosinophils Absolute 0.0 0.0 - 0.7 K/uL   Basophils Relative 0 %   Basophils Absolute 0.0 0.0 - 0.1 K/uL  I-Stat Chem 8, ED     Status: Abnormal   Collection Time: 01/29/16 12:12 AM  Result Value Ref Range   Sodium 136 135 - 145 mmol/L   Potassium 4.0 3.5 - 5.1 mmol/L   Chloride 105 101 - 111 mmol/L   BUN 28 (H) 6 - 20 mg/dL   Creatinine, Ser 0.80 0.44 - 1.00 mg/dL   Glucose, Bld 174 (H) 65 - 99 mg/dL   Calcium, Ion 1.30 1.15 - 1.40 mmol/L   TCO2 21 0 - 100 mmol/L   Hemoglobin  11.6 (L) 12.0 - 15.0 g/dL   HCT 34.0 (L) 36.0 - 46.0 %  Troponin I (q 6hr x 3)     Status: None   Collection Time: 01/29/16  2:35 AM  Result Value Ref Range   Troponin I <0.03 <0.03 ng/mL  Type and screen     Status: None   Collection Time: 01/29/16  4:30 AM  Result Value Ref Range   ABO/RH(D) A POS    Antibody Screen NEG    Sample Expiration 02/01/2016   CBC     Status: Abnormal   Collection Time: 01/29/16  4:38 AM  Result Value Ref Range   WBC 14.7 (H) 4.0 - 10.5 K/uL   RBC 3.74 (L) 3.87 - 5.11 MIL/uL   Hemoglobin 11.3 (L) 12.0 - 15.0 g/dL   HCT 34.7 (L) 36.0 - 46.0 %   MCV 92.8 78.0 - 100.0 fL   MCH 30.2 26.0 - 34.0 pg   MCHC 32.6 30.0 - 36.0 g/dL   RDW 13.5 11.5 - 15.5 %   Platelets 226 150 - 400 K/uL  Basic metabolic panel     Status: Abnormal   Collection Time: 01/29/16  4:38 AM  Result Value Ref Range   Sodium 136 135 - 145 mmol/L   Potassium 3.8 3.5 - 5.1 mmol/L   Chloride 104 101 - 111 mmol/L   CO2 26 22 - 32 mmol/L   Glucose, Bld 117 (H) 65 - 99 mg/dL   BUN 26 (H) 6 - 20  mg/dL   Creatinine, Ser 0.85 0.44 - 1.00 mg/dL   Calcium 9.6 8.9 - 10.3 mg/dL   GFR calc non Af Amer 58 (L) >60 mL/min   GFR calc Af Amer >60 >60 mL/min    Comment: (NOTE) The eGFR has been calculated using the CKD EPI equation. This calculation has not been validated in all clinical situations. eGFR's persistently <60 mL/min signify possible Chronic Kidney Disease.    Anion gap 6 5 - 15  Albumin     Status: None   Collection Time: 01/29/16  4:38 AM  Result Value Ref Range   Albumin 3.6 3.5 - 5.0 g/dL    Dg Chest 1 View  Result Date: 01/29/2016 CLINICAL DATA:  83 year old female with fall and right hip injury. EXAM: CHEST 1 VIEW COMPARISON:  None. FINDINGS: The lungs are clear. There is no pleural effusion or pneumothorax. The cardiac silhouette is within normal limits. There is atherosclerotic calcification of the aortic arch. There is osteopenia with degenerative changes of the spine and shoulders. No acute fracture. IMPRESSION: No active disease. Electronically Signed   By: Anner Crete M.D.   On: 01/29/2016 00:37   Ct Head Wo Contrast  Result Date: 01/29/2016 CLINICAL DATA:  83 year old female with fall. EXAM: CT HEAD WITHOUT CONTRAST CT CERVICAL SPINE WITHOUT CONTRAST TECHNIQUE: Multidetector CT imaging of the head and cervical spine was performed following the standard protocol without intravenous contrast. Multiplanar CT image reconstructions of the cervical spine were also generated. COMPARISON:  None. FINDINGS: CT HEAD FINDINGS Brain: There is mild age-related atrophy and chronic microvascular ischemic changes. There is no acute intracranial hemorrhage. No mass effect or midline shift noted. No extra-axial fluid collection. There is a 7 x 10 mm dural based right anterior parafalcine extra-axial lesion most compatible with a meningioma. Vascular: No hyperdense vessel or unexpected calcification. Skull: Normal. Negative for fracture or focal lesion. Sinuses/Orbits: No acute  finding. Other: None CT CERVICAL SPINE FINDINGS Alignment: No acute subluxation. There  is reversal of normal cervical lordosis most prominent at C4-C5. This may be related to muscle spasm or degenerative changes. Skull base and vertebrae: No acute fracture. No primary bone lesion or focal pathologic process. Soft tissues and spinal canal: Bilateral intra parotid lesions measuring up to 18 x 18 mm on the left. Ultrasound may provide better evaluation. There is heterogeneity of the thyroid gland with an 11 x 8 mm right lobe nodule. Disc levels: Multilevel degenerative changes. There is grade 1 C4-C5 anterolisthesis. Disc space narrowing most prominent at C5-C6 and C6-C7. Upper chest: Mild interlobular septal thickening may represent a degree of interstitial edema. Other: Atherosclerotic calcification of the aortic arch. There is mild asymmetric dilatation of the right internal jugular vein. IMPRESSION: No acute intracranial hemorrhage. Age-related atrophy and chronic microvascular ischemic changes. Small right anterior parafalcine meningioma. No acute/traumatic cervical spine pathology. Electronically Signed   By: Anner Crete M.D.   On: 01/29/2016 02:27   Ct Cervical Spine Wo Contrast  Result Date: 01/29/2016 CLINICAL DATA:  83 year old female with fall. EXAM: CT HEAD WITHOUT CONTRAST CT CERVICAL SPINE WITHOUT CONTRAST TECHNIQUE: Multidetector CT imaging of the head and cervical spine was performed following the standard protocol without intravenous contrast. Multiplanar CT image reconstructions of the cervical spine were also generated. COMPARISON:  None. FINDINGS: CT HEAD FINDINGS Brain: There is mild age-related atrophy and chronic microvascular ischemic changes. There is no acute intracranial hemorrhage. No mass effect or midline shift noted. No extra-axial fluid collection. There is a 7 x 10 mm dural based right anterior parafalcine extra-axial lesion most compatible with a meningioma. Vascular: No  hyperdense vessel or unexpected calcification. Skull: Normal. Negative for fracture or focal lesion. Sinuses/Orbits: No acute finding. Other: None CT CERVICAL SPINE FINDINGS Alignment: No acute subluxation. There is reversal of normal cervical lordosis most prominent at C4-C5. This may be related to muscle spasm or degenerative changes. Skull base and vertebrae: No acute fracture. No primary bone lesion or focal pathologic process. Soft tissues and spinal canal: Bilateral intra parotid lesions measuring up to 18 x 18 mm on the left. Ultrasound may provide better evaluation. There is heterogeneity of the thyroid gland with an 11 x 8 mm right lobe nodule. Disc levels: Multilevel degenerative changes. There is grade 1 C4-C5 anterolisthesis. Disc space narrowing most prominent at C5-C6 and C6-C7. Upper chest: Mild interlobular septal thickening may represent a degree of interstitial edema. Other: Atherosclerotic calcification of the aortic arch. There is mild asymmetric dilatation of the right internal jugular vein. IMPRESSION: No acute intracranial hemorrhage. Age-related atrophy and chronic microvascular ischemic changes. Small right anterior parafalcine meningioma. No acute/traumatic cervical spine pathology. Electronically Signed   By: Anner Crete M.D.   On: 01/29/2016 02:27   Dg Hip Unilat W Or Wo Pelvis 2-3 Views Right  Result Date: 01/29/2016 CLINICAL DATA:  83 year old female with fall and right hip pain. EXAM: DG HIP (WITH OR WITHOUT PELVIS) 2-3V RIGHT COMPARISON:  Abdominal radiograph dated 11/27/2008 FINDINGS: There is a mildly displaced fracture of the right femoral neck with mild proximal migration of the femoral shaft in relation to the head of the femur. The femoral head remains in anatomic alignment with the acetabulum. There is no dislocation. The bones are osteopenic. The soft tissues appear unremarkable. IMPRESSION: Mildly displaced fracture of the right femoral neck. Electronically Signed    By: Anner Crete M.D.   On: 01/29/2016 00:36    ROS Blood pressure (!) 139/52, pulse 74, temperature 98.2 F (36.8 C), temperature  source Oral, resp. rate 15, height _0  (1.6 m), weight 55.6 kg (122 lb 9.2 oz), SpO2 97 %. Physical Exam Physical Examination: General appearance - alert, well appearing, and in no distress Mental status - alert, oriented to person, RLE shortened and externally rotated, able to wiggle toes and move foot, pulses trace palpable and foot is warm; sensation intact; knee exam normal; tender lateral hip with pain on any attempted hip motion  Assessment/Plan: Right femoral neck fracture- Will require operative treatment in the form of hemiarthroplasty in order to allow for pain control and possible restoration of ambulatory status.I have discussed this with the patient and she desires to proceed with surgery  Debra Debra [name] 01/29/2016, 7:04 AM

## 2016-01-29 NOTE — Anesthesia Preprocedure Evaluation (Addendum)
Anesthesia Evaluation  Patient identified by MRN, date of birth, ID band Patient awake and Patient confused    Reviewed: Allergy & Precautions, NPO status , Patient's Chart, lab work & pertinent test results  Airway Mallampati: II  TM Distance: >3 FB     Dental   Pulmonary neg pulmonary ROS,    breath sounds clear to auscultation       Cardiovascular negative cardio ROS   Rhythm:Regular Rate:Normal     Neuro/Psych History noted. CG    GI/Hepatic negative GI ROS, Neg liver ROS,   Endo/Other  negative endocrine ROS  Renal/GU      Musculoskeletal   Abdominal   Peds  Hematology   Anesthesia Other Findings   Reproductive/Obstetrics                            Anesthesia Physical Anesthesia Plan  ASA: II  Anesthesia Plan: General   Post-op Pain Management:    Induction: Intravenous  Airway Management Planned: Oral ETT  Additional Equipment:   Intra-op Plan:   Post-operative Plan: Possible Post-op intubation/ventilation  Informed Consent: I have reviewed the patients History and Physical, chart, labs and discussed the procedure including the risks, benefits and alternatives for the proposed anesthesia with the patient or authorized representative who has indicated his/her understanding and acceptance.   Dental advisory given  Plan Discussed with: CRNA and Anesthesiologist  Anesthesia Plan Comments:        Anesthesia Quick Evaluation

## 2016-01-29 NOTE — ED Notes (Signed)
Call floor wants 15 mins and will call back

## 2016-01-30 ENCOUNTER — Encounter (HOSPITAL_COMMUNITY): Payer: Self-pay | Admitting: Orthopedic Surgery

## 2016-01-30 LAB — CBC
HCT: 29.1 % — ABNORMAL LOW (ref 36.0–46.0)
Hemoglobin: 9.5 g/dL — ABNORMAL LOW (ref 12.0–15.0)
MCH: 30.3 pg (ref 26.0–34.0)
MCHC: 32.6 g/dL (ref 30.0–36.0)
MCV: 92.7 fL (ref 78.0–100.0)
PLATELETS: 199 10*3/uL (ref 150–400)
RBC: 3.14 MIL/uL — AB (ref 3.87–5.11)
RDW: 13.5 % (ref 11.5–15.5)
WBC: 12.3 10*3/uL — AB (ref 4.0–10.5)

## 2016-01-30 LAB — BASIC METABOLIC PANEL
ANION GAP: 6 (ref 5–15)
BUN: 21 mg/dL — ABNORMAL HIGH (ref 6–20)
CALCIUM: 8.7 mg/dL — AB (ref 8.9–10.3)
CO2: 23 mmol/L (ref 22–32)
Chloride: 108 mmol/L (ref 101–111)
Creatinine, Ser: 0.78 mg/dL (ref 0.44–1.00)
GFR calc Af Amer: >60 mL/min (ref >60)
GLUCOSE: 129 mg/dL — AB (ref 65–99)
POTASSIUM: 3.7 mmol/L (ref 3.5–5.1)
SODIUM: 137 mmol/L (ref 135–145)

## 2016-01-30 LAB — VITAMIN D 25 HYDROXY (VIT D DEFICIENCY, FRACTURES): Vit D, 25-Hydroxy: 14.8 ng/mL — ABNORMAL LOW (ref 30.0–100.0)

## 2016-01-30 NOTE — Progress Notes (Signed)
Physical Therapy Treatment Patient Details Name: Debra [name] MRN: [id number] DOB: [birth year] Today's Date: 01/30/2016    History of Present Illness 83 y.o. female admitted from [facility] memory care unit with fx of R femoral nexk, s/p R hip hemiarthroplasty 01/29/16. PMH of dementia, frequent falls    PT Comments    Sit to stand x 2 with +2 max assist, pivot transfer to recliner with +2 max assist. Activity tolerance limited by pain. Pt somewhat lethargic likely 2* pain medication.   Follow Up Recommendations  SNF     Equipment Recommendations  None recommended by PT    Recommendations for Other Services       Precautions / Restrictions Precautions Precautions: Posterior Hip Precaution Booklet Issued: Yes (comment) Precaution Comments: instructed pt and daughter in law Mindy in posterior hip precautions, sign in room Restrictions Weight Bearing Restrictions: No RLE Weight Bearing: Weight bearing as tolerated    Mobility  Bed Mobility Overal bed mobility: Needs Assistance Bed Mobility: Supine to Sit     Supine to sit: +2 for physical assistance;Total assist     General bed mobility comments: pt 10%, limited by pain  Transfers Overall transfer level: Needs assistance Equipment used: Rolling walker (2 wheeled) Transfers: Sit to/from [employer] Transfers Sit to Stand: +2 physical assistance;Max assist;From elevated surface Stand pivot transfers: +2 physical assistance;Max assist       General transfer comment: +2 to rise and steady, stands with flexed posture unable to come to full upright position with verbal/manual cues, maintains weight on LLE 2* pain, stood with RW x 2 for pericare, then side by side SPT with +2 assist to recliner  Ambulation/Gait             General Gait Details: unable   Stairs            Wheelchair Mobility    Modified Rankin (Stroke Patients Only)       Balance Overall balance assessment: Needs  assistance Sitting-balance support: Feet unsupported;Bilateral upper extremity supported Sitting balance-Leahy Scale: Poor Sitting balance - Comments: min A for static sitting, leans L, flexed posture Postural control: Left lateral lean Standing balance support: Bilateral upper extremity supported Standing balance-Leahy Scale: Poor Standing balance comment: mod A for standing balance with RW, flexed posture, limited by pain                    Cognition Arousal/Alertness: Lethargic;Suspect due to medications Behavior During Therapy: [facility] for tasks assessed/performed Overall Cognitive Status: History of cognitive impairments - at baseline (short term memory deficits at baseline, pt oriented, able to follow commands) Area of Impairment: Memory     Memory: Decreased short-term memory              Exercises General Exercises - Lower Extremity Ankle Circles/Pumps: AAROM;Both;15 reps;Supine Short Arc Quad: AAROM;Right;10 reps;Supine Heel Slides: AAROM;Right;10 reps;Supine Hip ABduction/ADduction: AAROM;Right;10 reps;Supine    General Comments        Pertinent Vitals/Pain Pain Assessment: Faces Faces Pain Scale: Hurts whole lot Pain Location: R hip with movement Pain Descriptors / Indicators: Sore Pain Intervention(s): Limited activity within patient's tolerance;Monitored during session;Premedicated before session;Ice applied    Home Living Family/patient expects to be discharged to:: Skilled nursing facility               Additional Comments: from [facility] memory care unit, plans to DC to SNF then return to [facility]    Prior Function Level of Independence: Needs assistance  Comments: walked independently in memory care unit with rollator, h/o several falls in past year, supervision for bathing/dressing   PT Goals (current goals can now be found in the care plan section) Acute Rehab PT Goals Patient Stated Goal: return to [facility]  memory care unit where pt enjoys crafts, singing, bingo PT Goal Formulation: With patient/family Time For Goal Achievement: 02/13/16 Potential to Achieve Goals: Good Progress towards PT goals: Progressing toward goals    Frequency    Min 3X/week      PT Plan Current plan remains appropriate    Co-evaluation             End of Session Equipment Utilized During Treatment: Gait belt Activity Tolerance: Patient limited by pain Patient left: with call bell/phone within reach;with family/visitor present;in chair;with chair alarm set     Time: [id number] PT Time Calculation (min) (ACUTE ONLY): 34 min  Charges:  $Therapeutic Exercise: 8-22 mins $Therapeutic Activity: 23-37 mins                    G Codes:      Philomena Doheny 01/30/2016, 11:31 AM [phone]

## 2016-01-30 NOTE — Care Management Note (Signed)
Case Management Note  Patient Details  Name: Debra [name] MRN: [id number] Date of Birth: [birth year]  Subjective/Objective:     Pt admitted with hip pain.  Right femoral neck fracture displaced             Action/Plan:  Plan to discharge to SNF.  Expected Discharge Date:   (UNKNOWN)               Expected Discharge Plan:  Skilled Nursing Facility  In-House Referral:  Clinical Social Work  Discharge planning Services  CM Consult  Post Acute Care Choice:    Choice offered to:     DME Arranged:    DME Agency:     HH Arranged:    [place] Agency:     Status of Service:  In process, will continue to follow  If discussed at Long Length of Stay Meetings, dates discussed:    Additional CommentsPurcell Mouton, RN 01/30/2016, 4:16 PM

## 2016-01-30 NOTE — Progress Notes (Signed)
PROGRESS NOTE    Debra [name]  [id number] DOB: 03-Jan-1922 DOA: 01/28/2016 PCP: Jeanmarie Hubert, MD    Brief Narrative: Debra [name] is a 83 y.o. female with medical history significant of  Dementia HLD and zoster  presented with hip pain patient had a fall 1-3 days ago. She was found to have a rght hip fracture. Orthopedics consulted and scheduled to undergo a hemiarthroplasty today.   Assessment & Plan:   Principal Problem:   Fracture of femoral neck, right (HCC) Active Problems:   Leukocytosis   Dementia   Closed right hip fracture (HCC)   Abnormal ECG   Right femoral neck fracture:  Admitted for surgical repair. Consulted orthopedics.  She  underwent bipolar hip hemiarthroplasty.  Pain control . And gentle hydration.  Discussed with son at bedside.    Dementia: no agitation.    Abnormal EKG, ekg from 2016 has the same non specific repolarization changes. Currently she denies any chest pain.  Would not pursue any enzymes, can d/c telemetry. She would not be candidate for aggressive management. No echo on file.    Leukocytosis:  ? Reactive. UA IS negative.  No URI symptoms. CXR Is clear.  Monitor.   Anemia of blood loss from surgery. Hemoglobin at 9.5.         DVT prophylaxis: (Lovenox Code Status: (Full Family Communication: son at bedside.  Disposition Plan: pending further eval.   Consultants:   Orthopedics.    Procedures: right hemiarthroplasty.   Antimicrobials: none.     Subjective: No new complaints.   Objective: Vitals:   01/29/16 1827 01/29/16 2153 01/30/16 0614 01/30/16 0723  BP: 117/65 124/80 (!) 132/94   Pulse: 86 95 83   Resp: 16 18 18    Temp: 99 F (37.2 C) 98.4 F (36.9 C) 100.2 F (37.9 C) 97.3 F (36.3 C)  TempSrc:  Oral Oral Oral  SpO2: 93% 100% 100%   Weight:      Height:        Intake/Output Summary (Last 24 hours) at 01/30/16 1253 Last data filed at 01/30/16 0600  Gross per 24 hour  Intake          1953.75 ml    Output              590 ml  Net          1363.75 ml   Filed Weights   01/28/16 2252 01/29/16 0359  Weight: 56.7 kg (125 lb) 55.6 kg (122 lb 9.2 oz)    Examination:  General exam: Appears calm and comfortable  Respiratory system: Clear to auscultation. Respiratory effort normal. Cardiovascular system: S1 & S2 heard, RRR. No JVD, murmurs, rubs, gallops or clicks. No pedal edema. Gastrointestinal system: Abdomen is nondistended, soft and nontender. No organomegaly or masses felt. Normal bowel sounds heard. Central nervous system: Alert and confused. . Extremities: painful ROM of the right lower extremity.  Psychiatry: calm and pleasant.     Data Reviewed: I have personally reviewed following labs and imaging studies  CBC:  Recent Labs Lab 01/28/16 2358 01/29/16 0012 01/29/16 0438 01/30/16 0455  WBC 14.5*  --  14.7* 12.3*  NEUTROABS 11.3*  --   --   --   HGB 11.2* 11.6* 11.3* 9.5*  HCT 33.7* 34.0* 34.7* 29.1*  MCV 91.8  --  92.8 92.7  PLT 210  --  226 [id number]   Basic Metabolic Panel:  Recent Labs Lab 01/29/16 0012 01/29/16 0438 01/30/16 0455  NA 136 136 137  K 4.0 3.8 3.7  CL 105 104 108  CO2  --  26 23  GLUCOSE 174* 117* 129*  BUN 28* 26* 21*  CREATININE 0.80 0.85 0.78  CALCIUM  --  9.6 8.7*   GFR: Estimated Creatinine Clearance: 37.1 mL/min (by C-G formula based on SCr of 0.78 mg/dL). Liver Function Tests:  Recent Labs Lab 01/29/16 0438  ALBUMIN 3.6   No results for input(s): LIPASE, AMYLASE in the last 168 hours. No results for input(s): AMMONIA in the last 168 hours. Coagulation Profile: No results for input(s): INR, PROTIME in the last 168 hours. Cardiac Enzymes:  Recent Labs Lab 01/29/16 0235 01/29/16 1331  TROPONINI <0.03 0.04*   BNP (last 3 results) No results for input(s): PROBNP in the last 8760 hours. HbA1C: No results for input(s): HGBA1C in the last 72 hours. CBG: No results for input(s): GLUCAP in the last 168 hours. Lipid  Profile: No results for input(s): CHOL, HDL, LDLCALC, TRIG, CHOLHDL, LDLDIRECT in the last 72 hours. Thyroid Function Tests:  Recent Labs  01/29/16 0438  TSH 1.336   Anemia Panel: No results for input(s): VITAMINB12, FOLATE, FERRITIN, TIBC, IRON, RETICCTPCT in the last 72 hours. Sepsis Labs: No results for input(s): PROCALCITON, LATICACIDVEN in the last 168 hours.  Recent Results (from the past 240 hour(s))  Surgical PCR screen     Status: None   Collection Time: 01/29/16  6:42 AM  Result Value Ref Range Status   MRSA, PCR NEGATIVE NEGATIVE Final   Staphylococcus aureus NEGATIVE NEGATIVE Final    Comment:        The Xpert SA Assay (FDA approved for NASAL specimens in patients over 28 years of age), is one component of a comprehensive surveillance program.  Test performance has been validated by [facility] for patients greater than or equal to 12 year old. It is not intended to diagnose infection nor to guide or monitor treatment.          Radiology Studies: Dg Chest 1 View  Result Date: 01/29/2016 CLINICAL DATA:  83 year old female with fall and right hip injury. EXAM: CHEST 1 VIEW COMPARISON:  None. FINDINGS: The lungs are clear. There is no pleural effusion or pneumothorax. The cardiac silhouette is within normal limits. There is atherosclerotic calcification of the aortic arch. There is osteopenia with degenerative changes of the spine and shoulders. No acute fracture. IMPRESSION: No active disease. Electronically Signed   By: Anner Crete M.D.   On: 01/29/2016 00:37   Ct Head Wo Contrast  Result Date: 01/29/2016 CLINICAL DATA:  83 year old female with fall. EXAM: CT HEAD WITHOUT CONTRAST CT CERVICAL SPINE WITHOUT CONTRAST TECHNIQUE: Multidetector CT imaging of the head and cervical spine was performed following the standard protocol without intravenous contrast. Multiplanar CT image reconstructions of the cervical spine were also generated. COMPARISON:  None.  FINDINGS: CT HEAD FINDINGS Brain: There is mild age-related atrophy and chronic microvascular ischemic changes. There is no acute intracranial hemorrhage. No mass effect or midline shift noted. No extra-axial fluid collection. There is a 7 x 10 mm dural based right anterior parafalcine extra-axial lesion most compatible with a meningioma. Vascular: No hyperdense vessel or unexpected calcification. Skull: Normal. Negative for fracture or focal lesion. Sinuses/Orbits: No acute finding. Other: None CT CERVICAL SPINE FINDINGS Alignment: No acute subluxation. There is reversal of normal cervical lordosis most prominent at C4-C5. This may be related to muscle spasm or degenerative changes. Skull base and vertebrae: No acute fracture. No primary bone lesion or  focal pathologic process. Soft tissues and spinal canal: Bilateral intra parotid lesions measuring up to 18 x 18 mm on the left. Ultrasound may provide better evaluation. There is heterogeneity of the thyroid gland with an 11 x 8 mm right lobe nodule. Disc levels: Multilevel degenerative changes. There is grade 1 C4-C5 anterolisthesis. Disc space narrowing most prominent at C5-C6 and C6-C7. Upper chest: Mild interlobular septal thickening may represent a degree of interstitial edema. Other: Atherosclerotic calcification of the aortic arch. There is mild asymmetric dilatation of the right internal jugular vein. IMPRESSION: No acute intracranial hemorrhage. Age-related atrophy and chronic microvascular ischemic changes. Small right anterior parafalcine meningioma. No acute/traumatic cervical spine pathology. Electronically Signed   By: Anner Crete M.D.   On: 01/29/2016 02:27   Ct Cervical Spine Wo Contrast  Result Date: 01/29/2016 CLINICAL DATA:  83 year old female with fall. EXAM: CT HEAD WITHOUT CONTRAST CT CERVICAL SPINE WITHOUT CONTRAST TECHNIQUE: Multidetector CT imaging of the head and cervical spine was performed following the standard protocol without  intravenous contrast. Multiplanar CT image reconstructions of the cervical spine were also generated. COMPARISON:  None. FINDINGS: CT HEAD FINDINGS Brain: There is mild age-related atrophy and chronic microvascular ischemic changes. There is no acute intracranial hemorrhage. No mass effect or midline shift noted. No extra-axial fluid collection. There is a 7 x 10 mm dural based right anterior parafalcine extra-axial lesion most compatible with a meningioma. Vascular: No hyperdense vessel or unexpected calcification. Skull: Normal. Negative for fracture or focal lesion. Sinuses/Orbits: No acute finding. Other: None CT CERVICAL SPINE FINDINGS Alignment: No acute subluxation. There is reversal of normal cervical lordosis most prominent at C4-C5. This may be related to muscle spasm or degenerative changes. Skull base and vertebrae: No acute fracture. No primary bone lesion or focal pathologic process. Soft tissues and spinal canal: Bilateral intra parotid lesions measuring up to 18 x 18 mm on the left. Ultrasound may provide better evaluation. There is heterogeneity of the thyroid gland with an 11 x 8 mm right lobe nodule. Disc levels: Multilevel degenerative changes. There is grade 1 C4-C5 anterolisthesis. Disc space narrowing most prominent at C5-C6 and C6-C7. Upper chest: Mild interlobular septal thickening may represent a degree of interstitial edema. Other: Atherosclerotic calcification of the aortic arch. There is mild asymmetric dilatation of the right internal jugular vein. IMPRESSION: No acute intracranial hemorrhage. Age-related atrophy and chronic microvascular ischemic changes. Small right anterior parafalcine meningioma. No acute/traumatic cervical spine pathology. Electronically Signed   By: Anner Crete M.D.   On: 01/29/2016 02:27   Pelvis Portable  Result Date: 01/29/2016 CLINICAL DATA:  83 year old female status post right hip replacement EXAM: PORTABLE PELVIS 1-2 VIEWS COMPARISON:  01/28/2016  FINDINGS: Interval surgical changes of right hemiarthroplasty. Femoral component appears located projecting over the right acetabulum. Surgical drain in place. Soft tissue swelling with small amount of gas in the surgical bed. Degenerative changes of the left hip. IMPRESSION: Interval surgical changes of right hip hemiarthroplasty, with surgical drain projecting at the surgical site. Signed, Dulcy Fanny. Earleen Newport, DO [facility] Electronically Signed   By: Corrie Mckusick D.O.   On: 01/29/2016 18:45   Dg Hip Unilat W Or Wo Pelvis 2-3 Views Right  Result Date: 01/29/2016 CLINICAL DATA:  83 year old female with fall and right hip pain. EXAM: DG HIP (WITH OR WITHOUT PELVIS) 2-3V RIGHT COMPARISON:  Abdominal radiograph dated 11/27/2008 FINDINGS: There is a mildly displaced fracture of the right femoral neck with mild proximal migration  of the femoral shaft in relation to the head of the femur. The femoral head remains in anatomic alignment with the acetabulum. There is no dislocation. The bones are osteopenic. The soft tissues appear unremarkable. IMPRESSION: Mildly displaced fracture of the right femoral neck. Electronically Signed   By: Anner Crete M.D.   On: 01/29/2016 00:36        Scheduled Meds: . enoxaparin (LOVENOX) injection  40 mg Subcutaneous Q24H   Continuous Infusions: . sodium chloride 75 mL/hr at 01/30/16 0909     LOS: 1 day    Time spent: 30 min  Kamilo Och, MD [facility] Pager [phone]   If 7PM-7AM, please contact night-coverage [email or web address] Password [facility] 01/30/2016, 12:53 PM

## 2016-01-30 NOTE — Evaluation (Signed)
Physical Therapy Evaluation Patient Details Name: Debra [name] MRN: [id number] DOB: Dec 28, 1921 Today's Date: 01/30/2016   History of Present Illness  83 y.o. female admitted from [facility] memory care unit with fx of R femoral nexk, s/p R hip hemiarthroplasty 01/29/16. PMH of dementia, frequent falls  Clinical Impression  Pt admitted with above diagnosis. Pt currently with functional limitations due to the deficits listed below (see PT Problem List).  Performed hip exercises with assistance, will attempt mobility later this morning once pt has had pain medication. ST-SNF recommended, with goal of eventually returning to memory care unit at [facility].  Pt will benefit from skilled PT to increase their independence and safety with mobility to allow discharge to the venue listed below.       Follow Up Recommendations SNF    Equipment Recommendations  None recommended by PT    Recommendations for Other Services       Precautions / Restrictions Precautions Precautions: Posterior Hip Precaution Booklet Issued: Yes (comment) Precaution Comments: instructed pt and daughter in law Mindy in posterior hip precautions, sign in room Restrictions Weight Bearing Restrictions: No RLE Weight Bearing: Weight bearing as tolerated      Mobility  Bed Mobility               General bed mobility comments: NT- will attempt after pt has pain medication  Transfers                    Ambulation/Gait                Stairs            Wheelchair Mobility    Modified Rankin (Stroke Patients Only)       Balance                                             Pertinent Vitals/Pain Pain Assessment: Faces Faces Pain Scale: Hurts even more Pain Location: R hip with movement Pain Descriptors / Indicators: Sore Pain Intervention(s): Limited activity within patient's tolerance;Monitored during session;Premedicated before session;Patient  requesting pain meds-RN notified    Home Living Family/patient expects to be discharged to:: Skilled nursing facility                 Additional Comments: from [facility] memory care unit, plans to DC to SNF then return to [facility]    Prior Function Level of Independence: Needs assistance         Comments: walked independently in memory care unit with rollator, h/o several falls in past year, supervision for bathing/dressing     Hand Dominance        Extremity/Trunk Assessment   Upper Extremity Assessment Upper Extremity Assessment: Generalized weakness (shoulder elevation ~70* AROM, strength BUEs -4/5 grossly)    Lower Extremity Assessment Lower Extremity Assessment: RLE deficits/detail RLE Deficits / Details: knee ext +2/5, sensation intact to light touch, hip 2/5, hip AAROM decr 50% 2* pain       Communication   Communication: No difficulties  Cognition     Overall Cognitive Status: Impaired/Different from baseline (short term memory deficits at baseline, pt oriented, able to follow commands) Area of Impairment: Memory     Memory: Decreased short-term memory              General Comments      Exercises  General Exercises - Lower Extremity Ankle Circles/Pumps: AAROM;Both;15 reps;Supine Short Arc Quad: AAROM;Right;10 reps;Supine Heel Slides: AAROM;Right;10 reps;Supine Hip ABduction/ADduction: AAROM;Right;10 reps;Supine   Assessment/Plan    PT Assessment Patient needs continued PT services  PT Problem List Decreased range of motion;Decreased strength;Decreased mobility;Decreased activity tolerance;Pain;Decreased knowledge of precautions          PT Treatment Interventions DME instruction;Gait training;Functional mobility training;Therapeutic exercise;Therapeutic activities;Patient/family education    PT Goals (Current goals can be found in the Care Plan section)  Acute Rehab PT Goals Patient Stated Goal: return to [facility] memory care unit where pt enjoys crafts, singing, bingo PT Goal Formulation: With patient/family Time For Goal Achievement: 02/13/16 Potential to Achieve Goals: Good    Frequency Min 3X/week   Barriers to discharge        Co-evaluation               End of Session   Activity Tolerance: Patient limited by pain Patient left: in bed;with call bell/phone within reach;with family/visitor present Nurse Communication: Patient requests pain meds         Time: [id number] PT Time Calculation (min) (ACUTE ONLY): 36 min   Charges:   PT Evaluation $PT Eval Moderate Complexity: 1 Procedure PT Treatments $Therapeutic Exercise: 8-22 mins   PT G Codes:        Philomena Doheny 01/30/2016, 10:14 AM [phone]

## 2016-01-30 NOTE — Progress Notes (Signed)
OT Cancellation Note  Patient Details Name: Debra [name] MRN: [id number] DOB: Sep 09, 1921   Cancelled Treatment:    Reason Eval/Treat Not Completed: Other (comment).  Noted PT has recommended snf for pt. Will defer OT to that venue.  Lariyah Shetterly 01/30/2016, 12:13 PM  Lesle Chris, OTR/L [phone] 01/30/2016

## 2016-01-31 DIAGNOSIS — S72001D Fracture of unspecified part of neck of right femur, subsequent encounter for closed fracture with routine healing: Secondary | ICD-10-CM

## 2016-01-31 DIAGNOSIS — R9431 Abnormal electrocardiogram [ECG] [EKG]: Secondary | ICD-10-CM

## 2016-01-31 DIAGNOSIS — F0280 Dementia in other diseases classified elsewhere without behavioral disturbance: Secondary | ICD-10-CM

## 2016-01-31 DIAGNOSIS — D72829 Elevated white blood cell count, unspecified: Secondary | ICD-10-CM

## 2016-01-31 DIAGNOSIS — G20 Parkinson's disease: Secondary | ICD-10-CM

## 2016-01-31 LAB — BASIC METABOLIC PANEL
ANION GAP: 8 (ref 5–15)
BUN: 17 mg/dL (ref 6–20)
CALCIUM: 8.8 mg/dL — AB (ref 8.9–10.3)
CO2: 21 mmol/L — ABNORMAL LOW (ref 22–32)
CREATININE: 0.79 mg/dL (ref 0.44–1.00)
Chloride: 108 mmol/L (ref 101–111)
GFR calc Af Amer: >60 mL/min (ref >60)
GLUCOSE: 104 mg/dL — AB (ref 65–99)
Potassium: 3.4 mmol/L — ABNORMAL LOW (ref 3.5–5.1)
Sodium: 137 mmol/L (ref 135–145)

## 2016-01-31 LAB — CBC
HCT: 27.1 % — ABNORMAL LOW (ref 36.0–46.0)
Hemoglobin: 9 g/dL — ABNORMAL LOW (ref 12.0–15.0)
MCH: 31.3 pg (ref 26.0–34.0)
MCHC: 33.2 g/dL (ref 30.0–36.0)
MCV: 94.1 fL (ref 78.0–100.0)
PLATELETS: 187 10*3/uL (ref 150–400)
RBC: 2.88 MIL/uL — ABNORMAL LOW (ref 3.87–5.11)
RDW: 13.4 % (ref 11.5–15.5)
WBC: 13.2 10*3/uL — AB (ref 4.0–10.5)

## 2016-01-31 NOTE — Clinical Social Work Note (Signed)
Clinical Social Work Assessment  Patient Details  Name: Debra [name] MRN: [id number] Date of Birth: 01/27/1921  Date of referral:  01/31/16               Reason for consult:  Facility Placement, Discharge Planning                Permission sought to share information with:  Family Supports, Customer service manager, Case Manager Permission granted to share information::  Yes, Verbal Permission Granted  Name::      Debra [name] )  Agency::   (SNF's )  Relationship::   (Daughter )  Contact Information:   [phone])  Housing/Transportation Living arrangements for the past 2 months:  [place] of Information:  Adult Children Patient Interpreter Needed:  None Criminal Activity/Legal Involvement Pertinent to Current Situation/Hospitalization:  No - Comment as needed Significant Relationships:  Adult Children Lives with:  Facility Resident Do you feel safe going back to the place where you live?  No Need for family participation in patient care:  Yes (Comment)  Care giving concerns: Patient had surgery on right hip and requiring STR placement.    Social Worker assessment / plan:  MSW spoke with patient's dtr, Debra [name] in reference to post-acute placement for SNF. MSW introduced MSW role and SNF process. Patient's dtr reported that she is familiar with Debra [name] and [name] and would like referrals sent to those facilities. MSW discussed other options in the event preferred facilities are not available. Patient's dtr is planning to visit facilities today for additional information. No further concerns reported at this time. MSW to follow patient and pt's family for continued support and to facilitate pt's dc needs once medically stable.   MSW contacted RNCM, Debra [name] and left [name] message as patient is apart of the bundle program.   Employment status:  Retired Forensic scientist:  Medicare PT Recommendations:  [place] / Referral to  community resources:  [place]  Patient/Family's Response to care:  Patient alert and oriented to person only. Patient's dtr agreeable to SNF placement. Dtr supportive and strongly involved in patient's care.   Patient/Family's Understanding of and Emotional Response to Diagnosis, Current Treatment, and Prognosis: Patient's dtr knowledgeable of medical/surigcal interventions.   Emotional Assessment Appearance:  Appears stated age Attitude/Demeanor/Rapport:  Unable to Assess Affect (typically observed):  Unable to Assess Orientation:  Oriented to Self Alcohol / Substance use:  Not Applicable Psych involvement (Current and /or in the community):  No (Comment)  Discharge Needs  Concerns to be addressed:  Denies Needs/Concerns at this time Readmission within the last 30 days:  No Current discharge risk:  Dependent with Mobility Barriers to Discharge:  Continued Medical Work up   Debra [name] 01/31/2016, 10:30 AM

## 2016-01-31 NOTE — NC FL2 (Signed)
[place] MEDICAID FL2 LEVEL OF CARE SCREENING TOOL     IDENTIFICATION  Patient Name: Debra [name] Birthdate: December 15, 1921 Sex: female Admission Date (Current Location): 01/28/2016  [facility] and [place] Number:  Herbalist and Address:  [facility],  [place], [place]      Provider Number: [id number]  Attending Physician Name and Address:  Doreatha Lew, MD  Relative Name and Phone Number:       Current Level of Care: Hospital Recommended Level of Care: [place] Prior Approval Number:    Date Approved/Denied:   PASRR Number:  ([id number])  Discharge Plan: SNF    Current Diagnoses: Patient Active Problem List   Diagnosis Date Noted  . Leukocytosis 01/29/2016  . Dementia 01/29/2016  . Closed right hip fracture ([place]) 01/29/2016  . Abnormal ECG 01/29/2016  . Fracture of femoral neck, right ([place]) 01/29/2016  . History of fall 09/12/2015  . Hallucinations 02/08/2015  . Hearing deficit 02/10/2014  . Abnormality of gait 11/25/2013  . 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 Tremor 04/03/2013  . Urinary frequency 04/03/2013  . Memory deficit 04/03/2013  . 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 Dyslipidemia 04/03/2013    Orientation RESPIRATION BLADDER Height & Weight     Self  Normal Continent Weight: 122 lb 9.2 oz (55.6 kg) Height:  5\' 3"  (160 cm)  BEHAVIORAL SYMPTOMS/MOOD NEUROLOGICAL BOWEL NUTRITION STATUS   (none )  (none ) Incontinent Diet (Regular )  AMBULATORY STATUS COMMUNICATION OF NEEDS Skin   Extensive Assist Verbally Surgical wounds                       Personal Care Assistance Level of Assistance  Dressing, Feeding, Bathing Bathing Assistance: Maximum assistance Feeding assistance: Limited assistance Dressing Assistance: Maximum assistance     Functional Limitations Info  Speech, Hearing, Sight Sight Info: Adequate Hearing Info: Adequate Speech Info: Adequate    SPECIAL CARE FACTORS FREQUENCY  PT (By  licensed PT), OT (By licensed OT)     PT Frequency: 3 OT Frequency: 2            Contractures      Additional Factors Info  Code Status, Allergies Code Status Info: DNR CODE  Allergies Info: N/A           Current Medications (01/31/2016):  This is the current hospital active medication list Current Facility-Administered Medications  Medication Dose Route Frequency Provider Last Rate Last Dose  . 0.9 %  sodium chloride infusion   Intravenous Continuous Gaynelle Arabian, MD 10 mL/hr at 01/31/16 0900    . acetaminophen (TYLENOL) tablet 650 mg  650 mg Oral Q6H PRN Hosie Poisson, MD   650 mg at 01/30/16 0627  . bisacodyl (DULCOLAX) suppository 10 mg  10 mg Rectal Daily PRN Toy Baker, MD      . enoxaparin (LOVENOX) injection 40 mg  40 mg Subcutaneous Q24H Gaynelle Arabian, MD   40 mg at 01/31/16 0846  . haloperidol lactate (HALDOL) injection 2 mg  2 mg Intravenous Q6H PRN Hosie Poisson, MD      . HYDROcodone-acetaminophen (NORCO/VICODIN) 5-325 MG per tablet 1-2 tablet  1-2 tablet Oral Q6H PRN Toy Baker, MD      . menthol-cetylpyridinium (CEPACOL) lozenge 3 mg  1 lozenge Oral PRN Gaynelle Arabian, MD       Or  . phenol (CHLORASEPTIC) mouth spray 1 spray  1 spray Mouth/Throat PRN Gaynelle Arabian, MD      .  methocarbamol (ROBAXIN) tablet 500 mg  500 mg Oral Q6H PRN Toy Baker, MD       Or  . methocarbamol (ROBAXIN) 500 mg in dextrose 5 % 50 mL IVPB  500 mg Intravenous Q6H PRN Toy Baker, MD      . metoCLOPramide (REGLAN) tablet 5-10 mg  5-10 mg Oral Q8H PRN Gaynelle Arabian, MD       Or  . metoCLOPramide (REGLAN) injection 5-10 mg  5-10 mg Intravenous Q8H PRN Gaynelle Arabian, MD      . morphine 2 MG/ML injection 0.5 mg  0.5 mg Intravenous Q2H PRN Toy Baker, MD   0.5 mg at 01/30/16 1012  . ondansetron (ZOFRAN) tablet 4 mg  4 mg Oral Q6H PRN Gaynelle Arabian, MD       Or  . ondansetron [facility]) injection 4 mg  4 mg Intravenous Q6H PRN Gaynelle Arabian, MD      .  polyethylene glycol (MIRALAX / GLYCOLAX) packet 17 g  17 g Oral Daily PRN Toy Baker, MD         Discharge Medications: Please see discharge summary for a list of discharge medications.  Relevant Imaging Results:  Relevant Lab Results:   Additional Information SSN [id number]  Glendon Axe A

## 2016-01-31 NOTE — Progress Notes (Signed)
Subjective: 2 Days Post-Op Procedure(s) (LRB): ARTHROPLASTY BIPOLAR HIP (HEMIARTHROPLASTY) (Right) Patient reports pain as mild.    Objective: Vital signs in last 24 hours: Temp:  [98.2 F (36.8 C)-98.6 F (37 C)] 98.2 F (36.8 C) (01/24 0635) Pulse Rate:  [76-87] 81 (01/24 0635) Resp:  [17-18] 18 (01/24 0635) BP: (131-145)/(51-71) 145/51 (01/24 0635) SpO2:  [94 %-96 %] 94 % (01/24 0635)  Intake/Output from previous day: 01/23 0701 - 01/24 0700 In: 2160 [P.O.:360; I.V.:1800] Out: 550 [Urine:300; Drains:250] Intake/Output this shift: No intake/output data recorded.   Recent Labs  01/28/16 2358 01/29/16 0012 01/29/16 0438 01/30/16 0455 01/31/16 0519  HGB 11.2* 11.6* 11.3* 9.5* 9.0*    Recent Labs  01/30/16 0455 01/31/16 0519  WBC 12.3* 13.2*  RBC 3.14* 2.88*  HCT 29.1* 27.1*  PLT 199 187    Recent Labs  01/30/16 0455 01/31/16 0519  NA 137 137  K 3.7 3.4*  CL 108 108  CO2 23 21*  BUN 21* 17  CREATININE 0.78 0.79  GLUCOSE 129* 104*  CALCIUM 8.7* 8.8*   No results for input(s): LABPT, INR in the last 72 hours.  Neurovascular intact No cellulitis present Compartment soft   Assessment/Plan: 2 Days Post-Op Procedure(s) (LRB): ARTHROPLASTY BIPOLAR HIP (HEMIARTHROPLASTY) (Right) Up with therapy  Drain out Knee immobilizer discontinued  Joniah Bednarski V 01/31/2016, 9:30 AM

## 2016-01-31 NOTE — Clinical Social Work Placement (Signed)
   CLINICAL SOCIAL WORK PLACEMENT  NOTE  Date:  01/31/2016  Patient Details  Name: Debra [name] MRN: [id number] Date of Birth: 1921/07/18  Clinical Social Work is seeking post-discharge placement for this patient at the [place] level of care (*CSW will initial, date and re-position this form in  chart as items are completed):  Yes   Patient/family provided with [facility] Work Department's list of facilities offering this level of care within the geographic area requested by the patient (or if unable, by the patient's family).  Yes   Patient/family informed of their freedom to choose among providers that offer the needed level of care, that participate in Medicare, Medicaid or managed care program needed by the patient, have an available bed and are willing to accept the patient.  Yes   Patient/family informed of [facility]'s ownership interest in [facility] and [facility], as well as of the fact that they are under no obligation to receive care at these facilities.  PASRR submitted to EDS on 01/31/16     PASRR number received on 01/31/16     Existing PASRR number confirmed on       FL2 transmitted to all facilities in geographic area requested by pt/family on 01/31/16     FL2 transmitted to all facilities within larger geographic area on       Patient informed that his/her managed care company has contracts with or will negotiate with certain facilities, including the following:            Patient/family informed of bed offers received.  Patient chooses bed at       Physician recommends and patient chooses bed at      Patient to be transferred to   on  .  Patient to be transferred to facility by       Patient family notified on   of transfer.  Name of family member notified:        PHYSICIAN Please sign FL2, Please sign DNR     Additional Comment:    _______________________________________________ Glendon Axe  A 01/31/2016, 10:28 AM

## 2016-01-31 NOTE — Progress Notes (Addendum)
PROGRESS NOTE [facility]   Debra [name]   [id number] DOB: April 22, 1921  DOA: 01/28/2016 PCP: Jeanmarie Hubert, MD   Brief Narrative:  Debra [name] a 83 y.o.femalewith medical history significant of Dementia HLD and zoster presented with hip pain patient had a fall 1-3 days ago. She was found to have a rght hip fracture. Orthopedics consulted and now s/p hemiarthroplasty   Subjective: Patient seen and examined, doing well pain is well controlled. Have no complaints. Up with PT ortho d/ced drain. Afebrile.   Assessment & Plan: Right femoral neck fracture: s/p hip hemiarthroplasty  Consulted orthopedics appreciated management   Pain control as needed SNF when clear by ortho   Parkinson Dementia: no agitation.  Monitor   Abnormal EKG, ekg from 2016 has the same non specific repolarization changes. Currently she denies any chest pain. Would not pursue any enzymes, can d/c telemetry. She would not be candidate for aggressive management. No echo on file.   Leukocytosis:  ? Reactive. UA IS negative.  No URI symptoms. CXR Is clear.  Monitor.   Anemia of blood loss from surgery. Hemoglobin at 9.5 - stable  Will add Iron supplement Monitor CBC   DVT prophylaxis: Lovenox Code Status: DNR  Family Communication: None at bedside  Disposition Plan: SNF  Consultants:   Orthopedics   Procedures:   None   Antimicrobials:  None    Objective: Vitals:   01/30/16 1525 01/30/16 2052 01/31/16 0635 01/31/16 1353  BP: 131/71 (!) 135/54 (!) 145/51 132/61  Pulse: 87 76 81 89  Resp: 17 18 18 19   Temp: 98.6 F (37 C) 98.4 F (36.9 C) 98.2 F (36.8 C) 98.5 F (36.9 C)  TempSrc: Oral Oral Oral Oral  SpO2: 96% 95% 94% 93%  Weight:      Height:        Intake/Output Summary (Last 24 hours) at 01/31/16 2033 Last data filed at 01/31/16 1937  Gross per 24 hour  Intake             1575 ml  Output              250 ml  Net             1325 ml   Filed Weights   01/28/16 2252 01/29/16 0359  Weight: 56.7 kg (125 lb) 55.6 kg (122 lb 9.2 oz)    Examination:  General exam: Appears calm and comfortable  Respiratory system: Clear to auscultation. No wheezes,crackle or rhonchi Cardiovascular system: S1 & S2 heard, RRR. No JVD, murmurs, rubs or gallops Gastrointestinal system: Abdomen is nondistended, soft and nontender.. Normal bowel sounds heard. Central nervous system: Alert and oriented. Resting tremor Extremities: No pedal edema. Symmetric    Data Reviewed: I have personally reviewed following labs and imaging studies  CBC:  Recent Labs Lab 01/28/16 2358 01/29/16 0012 01/29/16 0438 01/30/16 0455 01/31/16 0519  WBC 14.5*  --  14.7* 12.3* 13.2*  NEUTROABS 11.3*  --   --   --   --   HGB 11.2* 11.6* 11.3* 9.5* 9.0*  HCT 33.7* 34.0* 34.7* 29.1* 27.1*  MCV 91.8  --  92.8 92.7 94.1  PLT 210  --  226 199 [id number]   Basic Metabolic Panel:  Recent Labs Lab 01/29/16 0012 01/29/16 0438 01/30/16 0455 01/31/16 0519  NA 136 136 137 137  K 4.0 3.8 3.7 3.4*  CL 105 104 108 108  CO2  --  26 23 21*  GLUCOSE 174* 117* 129* 104*  BUN 28* 26* 21* 17  CREATININE 0.80 0.85 0.78 0.79  CALCIUM  --  9.6 8.7* 8.8*   GFR: Estimated Creatinine Clearance: 37.1 mL/min (by C-G formula based on SCr of 0.79 mg/dL). Liver Function Tests:  Recent Labs Lab 01/29/16 0438  ALBUMIN 3.6   No results for input(s): LIPASE, AMYLASE in the last 168 hours. No results for input(s): AMMONIA in the last 168 hours. Coagulation Profile: No results for input(s): INR, PROTIME in the last 168 hours. Cardiac Enzymes:  Recent Labs Lab 01/29/16 0235 01/29/16 1331  TROPONINI <0.03 0.04*   BNP (last 3 results) No results for input(s): PROBNP in the last 8760 hours. HbA1C: No results for input(s): HGBA1C in the last 72 hours. CBG: No results for input(s): GLUCAP in the last 168 hours. Lipid Profile: No results for input(s): CHOL, HDL, LDLCALC, TRIG, CHOLHDL,  LDLDIRECT in the last 72 hours. Thyroid Function Tests:  Recent Labs  01/29/16 0438  TSH 1.336   Anemia Panel: No results for input(s): VITAMINB12, FOLATE, FERRITIN, TIBC, IRON, RETICCTPCT in the last 72 hours. Sepsis Labs: No results for input(s): PROCALCITON, LATICACIDVEN in the last 168 hours.  Recent Results (from the past 240 hour(s))  Surgical PCR screen     Status: None   Collection Time: 01/29/16  6:42 AM  Result Value Ref Range Status   MRSA, PCR NEGATIVE NEGATIVE Final   Staphylococcus aureus NEGATIVE NEGATIVE Final    Comment:        The Xpert SA Assay (FDA approved for NASAL specimens in patients over 13 years of age), is one component of a comprehensive surveillance program.  Test performance has been validated by [facility] for patients greater than or equal to 38 year old. It is not intended to diagnose infection nor to guide or monitor treatment.      Radiology Studies: No results found.    Scheduled Meds: . enoxaparin (LOVENOX) injection  40 mg Subcutaneous Q24H   Continuous Infusions: . sodium chloride 10 mL/hr at 01/31/16 0900     LOS: 2 days      Chipper Oman, MD [facility] Pager [phone]  If 7PM-7AM, please contact night-coverage [email or web address] Password [email or web address] 01/31/2016, 8:33 PM

## 2016-02-01 DIAGNOSIS — R430 Anosmia: Secondary | ICD-10-CM | POA: Diagnosis not present

## 2016-02-01 DIAGNOSIS — R531 Weakness: Secondary | ICD-10-CM | POA: Diagnosis not present

## 2016-02-01 DIAGNOSIS — R262 Difficulty in walking, not elsewhere classified: Secondary | ICD-10-CM | POA: Diagnosis not present

## 2016-02-01 DIAGNOSIS — S72001D Fracture of unspecified part of neck of right femur, subsequent encounter for closed fracture with routine healing: Secondary | ICD-10-CM | POA: Diagnosis not present

## 2016-02-01 DIAGNOSIS — D72829 Elevated white blood cell count, unspecified: Secondary | ICD-10-CM | POA: Diagnosis not present

## 2016-02-01 DIAGNOSIS — Z96641 Presence of right artificial hip joint: Secondary | ICD-10-CM | POA: Diagnosis not present

## 2016-02-01 DIAGNOSIS — G20 Parkinson's disease: Secondary | ICD-10-CM | POA: Diagnosis not present

## 2016-02-01 DIAGNOSIS — F0390 Unspecified dementia without behavioral disturbance: Secondary | ICD-10-CM | POA: Diagnosis not present

## 2016-02-01 DIAGNOSIS — R251 Tremor, unspecified: Secondary | ICD-10-CM | POA: Diagnosis not present

## 2016-02-01 DIAGNOSIS — F28 Other psychotic disorder not due to a substance or known physiological condition: Secondary | ICD-10-CM | POA: Diagnosis not present

## 2016-02-01 DIAGNOSIS — R9431 Abnormal electrocardiogram [ECG] [EKG]: Secondary | ICD-10-CM | POA: Diagnosis not present

## 2016-02-01 DIAGNOSIS — S72009A Fracture of unspecified part of neck of unspecified femur, initial encounter for closed fracture: Secondary | ICD-10-CM | POA: Diagnosis not present

## 2016-02-01 DIAGNOSIS — E785 Hyperlipidemia, unspecified: Secondary | ICD-10-CM | POA: Diagnosis not present

## 2016-02-01 DIAGNOSIS — Z9181 History of falling: Secondary | ICD-10-CM | POA: Diagnosis not present

## 2016-02-01 LAB — CBC
HCT: 27.8 % — ABNORMAL LOW (ref 36.0–46.0)
HEMOGLOBIN: 9.2 g/dL — AB (ref 12.0–15.0)
MCH: 30.7 pg (ref 26.0–34.0)
MCHC: 33.1 g/dL (ref 30.0–36.0)
MCV: 92.7 fL (ref 78.0–100.0)
PLATELETS: 212 10*3/uL (ref 150–400)
RBC: 3 MIL/uL — AB (ref 3.87–5.11)
RDW: 13.4 % (ref 11.5–15.5)
WBC: 12.8 10*3/uL — ABNORMAL HIGH (ref 4.0–10.5)

## 2016-02-01 MED ORDER — METHOCARBAMOL 500 MG PO TABS: 500.0000 mg | ORAL_TABLET | Freq: Four times a day (QID) | ORAL | 0 refills | Status: DC | PRN

## 2016-02-01 MED ORDER — ENOXAPARIN SODIUM 40 MG/0.4ML ~~LOC~~ SOLN: 40.0000 mg | SUBCUTANEOUS | 0 refills | Status: DC

## 2016-02-01 MED ORDER — HYDROCODONE-ACETAMINOPHEN 5-325 MG PO TABS: 1.0000 | ORAL_TABLET | Freq: Four times a day (QID) | ORAL | 0 refills | Status: DC | PRN

## 2016-02-01 MED ORDER — FERROUS FUMARATE 325 (106 FE) MG PO TABS
1.0000 | ORAL_TABLET | Freq: Two times a day (BID) | ORAL | 0 refills | Status: DC
Start: 2016-02-01 — End: 2016-02-21

## 2016-02-01 MED ORDER — SENNOSIDES-DOCUSATE SODIUM 8.6-50 MG PO TABS: 1.0000 | ORAL_TABLET | Freq: Two times a day (BID) | ORAL | 0 refills | Status: DC

## 2016-02-01 NOTE — Progress Notes (Signed)
Foley catheter was removed at 0600. Patient is incontinent of urine and RN and NT have been unsire if the patient has urinated due to small loose stools each time she is checked. Bladder scanned at 1330 which showed no amount of urine. Abdomen showed no signs of distention. Plan to continue to monitor. At 1700 patient was checked and still no sign of urine. Bladder scan at this point showed 240cc. Per rounding MD, in and out if greater than 400cc. All information passed on to night shift RN.  Callie Fielding RN

## 2016-02-01 NOTE — Progress Notes (Signed)
Physical Therapy Treatment Patient Details Name: Debra [name] MRN: [id number] DOB: 12-04-1921 Today's Date: 02/01/2016    History of Present Illness 83 y.o. female admitted from [facility] memory care unit with fx of R femoral nexk, s/p R hip hemiarthroplasty 01/29/16. PMH of dementia, frequent falls    PT Comments    POD # 2 R Hemi Hip 2nd Fx.  WBAT with THP.  Required + 2 assist for transfers and unable to take functional steps. Pt plans to D/C to SNF.   Follow Up Recommendations  SNF     Equipment Recommendations       Recommendations for Other Services       Precautions / Restrictions Precautions Precautions: Posterior Hip Precaution Comments: pt unable to recall THP Restrictions Weight Bearing Restrictions: No RLE Weight Bearing: Weight bearing as tolerated    Mobility  Bed Mobility Overal bed mobility: Needs Assistance Bed Mobility: Supine to Sit     Supine to sit: +2 for physical assistance;Total assist (pt 0%)     General bed mobility comments: limited by pain and fear  Transfers Overall transfer level: Needs assistance Equipment used: None Transfers: Stand Pivot Transfers   Stand pivot transfers: +2 physical assistance;Total assist       General transfer comment: assisted from elevated bed to [facility] + 2 assist pivot "bear hug" as stand pivot with walker due to fear/anxiety/pain level.  then assisted back to bed same fashion.    Ambulation/Gait                 Stairs            Wheelchair Mobility    Modified Rankin (Stroke Patients Only)       Balance                                    Cognition Arousal/Alertness:  (sleepy with intermittent arousal) Behavior During Therapy: Anxious (fearful) Overall Cognitive Status: History of cognitive impairments - at baseline Area of Impairment: Memory               General Comments: pt resides at a Memory Care Unit at  ALF     Exercises      General  Comments        Pertinent Vitals/Pain Pain Assessment: Faces Faces Pain Scale: Hurts even more Pain Location: R hip with movement Pain Descriptors / Indicators: Discomfort;Grimacing Pain Intervention(s): Monitored during session;Repositioned    Home Living                      Prior Function            PT Goals (current goals can now be found in the care plan section) Progress towards PT goals: Progressing toward goals    Frequency    Min 3X/week      PT Plan Current plan remains appropriate    Co-evaluation             End of Session Equipment Utilized During Treatment: Gait belt Activity Tolerance: Patient limited by pain (limited by fear/anxiety) Patient left: in bed;with bed alarm set;with call bell/phone within reach;with family/visitor present     Time: 1120-1147 PT Time Calculation (min) (ACUTE ONLY): 27 min  Charges:  $Therapeutic Activity: 23-37 mins                    G Codes:  Rica Koyanagi  PTA [facility]  Acute  Rehab Pager      [phone]

## 2016-02-01 NOTE — Progress Notes (Signed)
Report called to [place] at [place]. Hospital course reviewed and all questions answered. Ready for pick up at 12:30.  K. Hardin Negus RN

## 2016-02-01 NOTE — Discharge Summary (Signed)
Physician Discharge Summary  Equilla Pyland  [id number]  DOB: Nov 19, 1921  DOA: 01/28/2016 PCP: Jeanmarie Hubert, MD  Admit date: 01/28/2016 Discharge date: 02/01/2016  Admitted From:  ALF Disposition:  SNF  Recommendations for Outpatient Follow-up:  1. Follow up with PCP in 1-2 weeks 2. Please obtain BMP/CBC in one week to check for hemoglobin and WBC's  3. Follow up with orthopedic surgeon in 2 weeks   Discharge Condition: Stable  CODE STATUS: DNR  Diet recommendation: Heart Healthy   Brief/Interim Summary: Stacia Brickett a 83 y.o.femalewith medical history significant of Dementia HLD and zoster presented with hip pain after a fall 2-3 PTA. She was found to have a rght hip fracture. Orthopedics consulted and now s/p R hemiarthroplasty. Patient doing well post op and is ready to begin physical rehabilitation.  Subjective: Patient seen and examined with daughter, mild pain. Patient working with PT and doing well. Tolerating diet well and having regular BM. Patient remains afebrile   Discharge Diagnoses/Hospital Course:   Right femoral neck fracture: s/p hip hemiarthroplasty  Weight bearing as tolerated in R leg  PT at SNF DVT prophylaxis - Lovenox for 2 weeks -> switch to Aspirin 81 mg daily for 3 weeks  Follow up in 2 weeks with Dr Wynelle Link  Parkinson Dementia: stable  Monitor   Abnormal EKG, ekg from 2016 has the same non specific repolarization changes. Currently she denies any chest pain. Would not pursue any enzymes, can d/c telemetry. She would not be candidate for aggressive management. No echo on file.   Leukocytosis:  Reactive UA IS negative.  No URI symptoms. CXR Is clear.  Monitor.   Anemia of blood loss from surgery. Hemoglobin - stable for 48 hrs  Will add Iron supplement Monitor CBC in 1 week    Discharge Instructions  You were cared for by a hospitalist during your hospital stay. If you have any questions about your discharge medications or the care  you received while you were in the hospital after you are discharged, you can call the unit and asked to speak with the hospitalist on call if the hospitalist that took care of you is not available. Once you are discharged, your primary care physician will handle any further medical issues. Please note that NO REFILLS for any discharge medications will be authorized once you are discharged, as it is imperative that you return to your primary care physician (or establish a relationship with a primary care physician if you do not have one) for your aftercare needs so that they can reassess your need for medications and monitor your lab values.  Discharge Instructions    Call MD / Call 911    Complete by:  As directed    If you experience chest pain or shortness of breath, CALL 911 and be transported to the hospital emergency room.  If you develope a fever above 101 F, pus (white drainage) or increased drainage or redness at the wound, or calf pain, call your surgeon's office.   Call MD for:  difficulty breathing, headache or visual disturbances    Complete by:  As directed    Call MD for:  extreme fatigue    Complete by:  As directed    Call MD for:  hives    Complete by:  As directed    Call MD for:  persistant dizziness or light-headedness    Complete by:  As directed    Call MD for:  persistant nausea and vomiting  Complete by:  As directed    Call MD for:  redness, tenderness, or signs of infection (pain, swelling, redness, odor or green/yellow discharge around incision site)    Complete by:  As directed    Call MD for:  severe uncontrolled pain    Complete by:  As directed    Call MD for:  temperature >100.4    Complete by:  As directed    Change dressing    Complete by:  As directed    You may change your dressing dressing daily with sterile 4 x 4 inch gauze dressing and paper tape.  Do not submerge the incision under water.   Constipation Prevention    Complete by:  As directed     Drink plenty of fluids.  Prune juice may be helpful.  You may use a stool softener, such as Colace (over the counter) 100 mg twice a day.  Use MiraLax (over the counter) for constipation as needed.   Diet - low sodium heart healthy    Complete by:  As directed    Discharge instructions    Complete by:  As directed    Pick up stool softner and laxative for home use following surgery while on pain medications. Do not submerge incision under water. Please use good hand washing techniques while changing dressing each day. May shower starting three days after surgery. Please use a clean towel to pat the incision dry following showers. Continue to use ice for pain and swelling after surgery. Do not use any lotions or creams on the incision until instructed by your surgeon.  Wear both TED hose on both legs during the day every day for three weeks, but may have off at night at home.  Postoperative Constipation Protocol  Constipation - defined medically as fewer than three stools per week and severe constipation as less than one stool per week.  One of the most common issues patients have following surgery is constipation.  Even if you have a regular bowel pattern at home, your normal regimen is likely to be disrupted due to multiple reasons following surgery.  Combination of anesthesia, postoperative narcotics, change in appetite and fluid intake all can affect your bowels.  In order to avoid complications following surgery, here are some recommendations in order to help you during your recovery period.  Colace (docusate) - Pick up an over-the-counter form of Colace or another stool softener and take twice a day as long as you are requiring postoperative pain medications.  Take with a full glass of water daily.  If you experience loose stools or diarrhea, hold the colace until you stool forms back up.  If your symptoms do not get better within 1 week or if they get worse, check with your  doctor.  Dulcolax (bisacodyl) - Pick up over-the-counter and take as directed by the product packaging as needed to assist with the movement of your bowels.  Take with a full glass of water.  Use this product as needed if not relieved by Colace only.   MiraLax (polyethylene glycol) - Pick up over-the-counter to have on hand.  MiraLax is a solution that will increase the amount of water in your bowels to assist with bowel movements.  Take as directed and can mix with a glass of water, juice, soda, coffee, or tea.  Take if you go more than two days without a movement. Do not use MiraLax more than once per day. Call your doctor if you are still  constipated or irregular after using this medication for 7 days in a row.  If you continue to have problems with postoperative constipation, please contact the office for further assistance and recommendations.  If you experience "the worst abdominal pain ever" or develop nausea or vomiting, please contact the office immediatly for further recommendations for treatment.   Take Lovenox injections for two weeks, then discontinue the Lovenox injections.  After the injections completed, then take a baby Aspirin 81 mg daily for three additional weeks.   Do not sit on low chairs, stoools or toilet seats, as it may be difficult to get up from low surfaces    Complete by:  As directed    Driving restrictions    Complete by:  As directed    No driving until released by the physician.   Follow the hip precautions as taught in Physical Therapy    Complete by:  As directed    Increase activity slowly    Complete by:  As directed    Increase activity slowly as tolerated    Complete by:  As directed    Lifting restrictions    Complete by:  As directed    No lifting until released by the physician.   Patient may shower    Complete by:  As directed    You may shower without a dressing once there is no drainage.  Do not wash over the wound.  If drainage remains, do not  shower until drainage stops.   TED hose    Complete by:  As directed    Use stockings (TED hose) for 3 weeks on both leg(s).  You may remove them at night for sleeping.   Weight bearing as tolerated    Complete by:  As directed    Laterality:  right   Extremity:  Lower     Allergies as of 02/01/2016   No Known Allergies     Medication List    STOP taking these medications   gabapentin 100 MG capsule Commonly known as:  NEURONTIN   valACYclovir 1000 MG tablet Commonly known as:  VALTREX     TAKE these medications   acetaminophen 500 MG tablet Commonly known as:  TYLENOL Take 1,000 mg by mouth every 6 (six) hours as needed for moderate pain.   enoxaparin 40 MG/0.4ML injection Commonly known as:  LOVENOX Inject 0.4 mLs (40 mg total) into the skin daily. Take Lovenox injections for two weeks, then discontinue the Lovenox injections.  After the injections completed, then take a baby Aspirin 81 mg daily for three additional weeks. Start taking on:  02/02/2016   ferrous fumarate 325 (106 Fe) MG Tabs tablet Commonly known as:  FERRETTS Take 1 tablet (106 mg of iron total) by mouth 2 (two) times daily.   HYDROcodone-acetaminophen 5-325 MG tablet Commonly known as:  NORCO/VICODIN Take 1-2 tablets by mouth every 6 (six) hours as needed for moderate pain.   methocarbamol 500 MG tablet Commonly known as:  ROBAXIN Take 1 tablet (500 mg total) by mouth every 6 (six) hours as needed for muscle spasms.   senna-docusate 8.6-50 MG tablet Commonly known as:  Senokot-S Take 1 tablet by mouth 2 (two) times daily.      Follow-up Information    Gearlean Alf, MD. Schedule an appointment as soon as possible for a visit on 02/13/2016.   Specialty:  Orthopedic Surgery Why:  Call office ASAP at [phone] to setup appointment on Tuesday 02/13/2016 with Dr. Wynelle Link. Contact  information: [address] [place] [address] [id number]        Jeanmarie Hubert, MD. Schedule an  appointment as soon as possible for a visit in 2 week(s).   Specialty:  Internal Medicine Contact information: [place] [address] [phone]          No Known Allergies  Consultations:  Ortopedic Surgery - Dr Wynelle Link   Procedures/Studies: Dg Chest 1 View  Result Date: 01/29/2016 CLINICAL DATA:  83 year old female with fall and right hip injury. EXAM: CHEST 1 VIEW COMPARISON:  None. FINDINGS: The lungs are clear. There is no pleural effusion or pneumothorax. The cardiac silhouette is within normal limits. There is atherosclerotic calcification of the aortic arch. There is osteopenia with degenerative changes of the spine and shoulders. No acute fracture. IMPRESSION: No active disease. Electronically Signed   By: Anner Crete M.D.   On: 01/29/2016 00:37   Ct Head Wo Contrast  Result Date: 01/29/2016 CLINICAL DATA:  83 year old female with fall. EXAM: CT HEAD WITHOUT CONTRAST CT CERVICAL SPINE WITHOUT CONTRAST TECHNIQUE: Multidetector CT imaging of the head and cervical spine was performed following the standard protocol without intravenous contrast. Multiplanar CT image reconstructions of the cervical spine were also generated. COMPARISON:  None. FINDINGS: CT HEAD FINDINGS Brain: There is mild age-related atrophy and chronic microvascular ischemic changes. There is no acute intracranial hemorrhage. No mass effect or midline shift noted. No extra-axial fluid collection. There is a 7 x 10 mm dural based right anterior parafalcine extra-axial lesion most compatible with a meningioma. Vascular: No hyperdense vessel or unexpected calcification. Skull: Normal. Negative for fracture or focal lesion. Sinuses/Orbits: No acute finding. Other: None CT CERVICAL SPINE FINDINGS Alignment: No acute subluxation. There is reversal of normal cervical lordosis most prominent at C4-C5. This may be related to muscle spasm or degenerative changes. Skull base and vertebrae: No acute  fracture. No primary bone lesion or focal pathologic process. Soft tissues and spinal canal: Bilateral intra parotid lesions measuring up to 18 x 18 mm on the left. Ultrasound may provide better evaluation. There is heterogeneity of the thyroid gland with an 11 x 8 mm right lobe nodule. Disc levels: Multilevel degenerative changes. There is grade 1 C4-C5 anterolisthesis. Disc space narrowing most prominent at C5-C6 and C6-C7. Upper chest: Mild interlobular septal thickening may represent a degree of interstitial edema. Other: Atherosclerotic calcification of the aortic arch. There is mild asymmetric dilatation of the right internal jugular vein. IMPRESSION: No acute intracranial hemorrhage. Age-related atrophy and chronic microvascular ischemic changes. Small right anterior parafalcine meningioma. No acute/traumatic cervical spine pathology. Electronically Signed   By: Anner Crete M.D.   On: 01/29/2016 02:27   Ct Cervical Spine Wo Contrast  Result Date: 01/29/2016 CLINICAL DATA:  83 year old female with fall. EXAM: CT HEAD WITHOUT CONTRAST CT CERVICAL SPINE WITHOUT CONTRAST TECHNIQUE: Multidetector CT imaging of the head and cervical spine was performed following the standard protocol without intravenous contrast. Multiplanar CT image reconstructions of the cervical spine were also generated. COMPARISON:  None. FINDINGS: CT HEAD FINDINGS Brain: There is mild age-related atrophy and chronic microvascular ischemic changes. There is no acute intracranial hemorrhage. No mass effect or midline shift noted. No extra-axial fluid collection. There is a 7 x 10 mm dural based right anterior parafalcine extra-axial lesion most compatible with a meningioma. Vascular: No hyperdense vessel or unexpected calcification. Skull: Normal. Negative for fracture or focal lesion. Sinuses/Orbits: No acute finding. Other: None CT CERVICAL SPINE FINDINGS Alignment:  No acute subluxation. There is reversal of normal cervical lordosis  most prominent at C4-C5. This may be related to muscle spasm or degenerative changes. Skull base and vertebrae: No acute fracture. No primary bone lesion or focal pathologic process. Soft tissues and spinal canal: Bilateral intra parotid lesions measuring up to 18 x 18 mm on the left. Ultrasound may provide better evaluation. There is heterogeneity of the thyroid gland with an 11 x 8 mm right lobe nodule. Disc levels: Multilevel degenerative changes. There is grade 1 C4-C5 anterolisthesis. Disc space narrowing most prominent at C5-C6 and C6-C7. Upper chest: Mild interlobular septal thickening may represent a degree of interstitial edema. Other: Atherosclerotic calcification of the aortic arch. There is mild asymmetric dilatation of the right internal jugular vein. IMPRESSION: No acute intracranial hemorrhage. Age-related atrophy and chronic microvascular ischemic changes. Small right anterior parafalcine meningioma. No acute/traumatic cervical spine pathology. Electronically Signed   By: Anner Crete M.D.   On: 01/29/2016 02:27   Pelvis Portable  Result Date: 01/29/2016 CLINICAL DATA:  83 year old female status post right hip replacement EXAM: PORTABLE PELVIS 1-2 VIEWS COMPARISON:  01/28/2016 FINDINGS: Interval surgical changes of right hemiarthroplasty. Femoral component appears located projecting over the right acetabulum. Surgical drain in place. Soft tissue swelling with small amount of gas in the surgical bed. Degenerative changes of the left hip. IMPRESSION: Interval surgical changes of right hip hemiarthroplasty, with surgical drain projecting at the surgical site. Signed, Dulcy Fanny. Earleen Newport, DO [facility] Electronically Signed   By: Corrie Mckusick D.O.   On: 01/29/2016 18:45   Dg Hip Unilat W Or Wo Pelvis 2-3 Views Right  Result Date: 01/29/2016 CLINICAL DATA:  83 year old female with fall and right hip pain. EXAM: DG HIP (WITH OR WITHOUT  PELVIS) 2-3V RIGHT COMPARISON:  Abdominal radiograph dated 11/27/2008 FINDINGS: There is a mildly displaced fracture of the right femoral neck with mild proximal migration of the femoral shaft in relation to the head of the femur. The femoral head remains in anatomic alignment with the acetabulum. There is no dislocation. The bones are osteopenic. The soft tissues appear unremarkable. IMPRESSION: Mildly displaced fracture of the right femoral neck. Electronically Signed   By: Anner Crete M.D.   On: 01/29/2016 00:36     Discharge Exam: Vitals:   01/31/16 2043 02/01/16 0459  BP: 136/76 (!) 141/52  Pulse: 91 81  Resp: 16 16  Temp: 99.4 F (37.4 C) 99.2 F (37.3 C)   Vitals:   01/31/16 0635 01/31/16 1353 01/31/16 2043 02/01/16 0459  BP: (!) 145/51 132/61 136/76 (!) 141/52  Pulse: 81 89 91 81  Resp: 18 19 16 16   Temp: 98.2 F (36.8 C) 98.5 F (36.9 C) 99.4 F (37.4 C) 99.2 F (37.3 C)  TempSrc: Oral Oral Oral Oral  SpO2: 94% 93% 95% 94%  Weight:      Height:        General: Pt is alert, awake, not in acute distress Cardiovascular: RRR, S1/S2 +, no rubs, no gallops Respiratory: CTA bilaterally, no wheezing, no rhonchi Abdominal: Soft, NT, ND, bowel sounds + Extremities: no edema, no cyanosis, resting tremor, Limited ROM due to pain, Dressing clean and dry    The results of significant diagnostics from this hospitalization (including imaging, microbiology, ancillary and laboratory) are listed below for reference.     Microbiology: Recent Results (from the past 240 hour(s))  Surgical PCR screen     Status: None   Collection Time: 01/29/16  6:42 AM  Result Value Ref Range Status   MRSA, PCR NEGATIVE NEGATIVE Final   Staphylococcus aureus NEGATIVE NEGATIVE Final    Comment:        The Xpert SA Assay (FDA approved for NASAL specimens in patients over 31 years of age), is one component of a comprehensive surveillance program.  Test performance has been validated by  [facility] for patients greater than or equal to 35 year old. It is not intended to diagnose infection nor to guide or monitor treatment.     Labs: BNP (last 3 results) No results for input(s): BNP in the last 8760 hours. Basic Metabolic Panel:  Recent Labs Lab 01/29/16 0012 01/29/16 0438 01/30/16 0455 01/31/16 0519  NA 136 136 137 137  K 4.0 3.8 3.7 3.4*  CL 105 104 108 108  CO2  --  26 23 21*  GLUCOSE 174* 117* 129* 104*  BUN 28* 26* 21* 17  CREATININE 0.80 0.85 0.78 0.79  CALCIUM  --  9.6 8.7* 8.8*   Liver Function Tests:  Recent Labs Lab 01/29/16 0438  ALBUMIN 3.6   No results for input(s): LIPASE, AMYLASE in the last 168 hours. No results for input(s): AMMONIA in the last 168 hours. CBC:  Recent Labs Lab 01/28/16 2358 01/29/16 0012 01/29/16 0438 01/30/16 0455 01/31/16 0519 02/01/16 0533  WBC 14.5*  --  14.7* 12.3* 13.2* 12.8*  NEUTROABS 11.3*  --   --   --   --   --   HGB 11.2* 11.6* 11.3* 9.5* 9.0* 9.2*  HCT 33.7* 34.0* 34.7* 29.1* 27.1* 27.8*  MCV 91.8  --  92.8 92.7 94.1 92.7  PLT 210  --  226 199 187 212   Cardiac Enzymes:  Recent Labs Lab 01/29/16 0235 01/29/16 1331  TROPONINI <0.03 0.04*   BNP: Invalid input(s): POCBNP CBG: No results for input(s): GLUCAP in the last 168 hours. D-Dimer No results for input(s): DDIMER in the last 72 hours. Hgb A1c No results for input(s): HGBA1C in the last 72 hours. Lipid Profile No results for input(s): CHOL, HDL, LDLCALC, TRIG, CHOLHDL, LDLDIRECT in the last 72 hours. Thyroid function studies No results for input(s): TSH, T4TOTAL, T3FREE, THYROIDAB in the last 72 hours.  Invalid input(s): FREET3 Anemia work up No results for input(s): VITAMINB12, FOLATE, FERRITIN, TIBC, IRON, RETICCTPCT in the last 72 hours. Urinalysis    Component Value Date/Time   COLORURINE YELLOW 01/29/2016 0836   APPEARANCEUR CLEAR 01/29/2016 0836   LABSPEC 1.017 01/29/2016 0836   PHURINE 6.0 01/29/2016 0836    GLUCOSEU 50 (A) 01/29/2016 0836   HGBUR SMALL (A) 01/29/2016 0836   BILIRUBINUR NEGATIVE 01/29/2016 0836   KETONESUR NEGATIVE 01/29/2016 0836   PROTEINUR NEGATIVE 01/29/2016 0836   UROBILINOGEN 0.2 10/15/2014 1030   NITRITE NEGATIVE 01/29/2016 0836   LEUKOCYTESUR NEGATIVE 01/29/2016 0836   Sepsis Labs Invalid input(s): PROCALCITONIN,  WBC,  LACTICIDVEN Microbiology Recent Results (from the past 240 hour(s))  Surgical PCR screen     Status: None   Collection Time: 01/29/16  6:42 AM  Result Value Ref Range Status   MRSA, PCR NEGATIVE NEGATIVE Final   Staphylococcus aureus NEGATIVE NEGATIVE Final    Comment:        The Xpert SA Assay (FDA approved for NASAL specimens in patients over 2 years of age), is one component of a comprehensive surveillance program.  Test performance has been validated by [facility] for patients greater than or equal to 26 year old. It is  not intended to diagnose infection nor to guide or monitor treatment.     Time coordinating discharge: Over 30 minutes  SIGNED:  Chipper Oman, MD  [facility] 02/01/2016, 11:18 AM Pager   If 7PM-7AM, please contact night-coverage [email or web address] Password [email or web address]

## 2016-02-01 NOTE — Discharge Instructions (Signed)
Dr. Gaynelle Arabian Total Joint Specialist [facility] [address] [place], [place] [address] [phone]   POSTERIOR TOTAL HIP / BIPOLAR HIP REPLACEMENT POSTOPERATIVE DIRECTIONS  Hip Rehabilitation, Guidelines Following Surgery  The results of a hip operation are greatly improved after range of motion and muscle strengthening exercises. Follow all safety measures which are given to protect your hip. If any of these exercises cause increased pain or swelling in your joint, decrease the amount until you are comfortable again. Then slowly increase the exercises. Call your caregiver if you have problems or questions.   HOME CARE INSTRUCTIONS  Remove items at home which could result in a fall. This includes throw rugs or furniture in walking pathways.   ICE to the affected hip every three hours for 30 minutes at a time and then as needed for pain and swelling.  Continue to use ice on the hip for pain and swelling from surgery. You may notice swelling that will progress down to the foot and ankle.  This is normal after surgery.  Elevate the leg when you are not up walking on it.    Continue to use the breathing machine which will help keep your temperature down.  It is common for your temperature to cycle up and down following surgery, especially at night when you are not up moving around and exerting yourself.  The breathing machine keeps your lungs expanded and your temperature down.  DIET You may resume your previous home diet once your are discharged from the hospital.  DRESSING / WOUND CARE / SHOWERING You may shower 3 days after surgery, but keep the wounds dry during showering.  You may use an occlusive plastic wrap (Press'n Seal for example), NO SOAKING/SUBMERGING IN THE BATHTUB.  If the bandage gets wet, change with a clean dry gauze.  If the incision gets wet, pat the wound dry with a clean towel. You may start showering once you are discharged home but do  not submerge the incision under water. Just pat the incision dry and apply a dry gauze dressing on daily. Change the surgical dressing daily and reapply a dry dressing each time.    ACTIVITY Walk with your walker as instructed. Use walker as long as suggested by your caregivers. Avoid periods of inactivity such as sitting longer than an hour when not asleep. This helps prevent blood clots.  You may resume a sexual relationship in one month or when given the OK by your doctor.  You may return to work once you are cleared by your doctor.  Do not drive a car for 6 weeks or until released by you surgeon.  Do not drive while taking narcotics.  WEIGHT BEARING Weight bearing as tolerated with assist device (walker, cane, etc) as directed, use it as long as suggested by your surgeon or therapist, typically at least 4-6 weeks.  POSTOPERATIVE CONSTIPATION PROTOCOL Constipation - defined medically as fewer than three stools per week and severe constipation as less than one stool per week.  One of the most common issues patients have following surgery is constipation.  Even if you have a regular bowel pattern at home, your normal regimen is likely to be disrupted due to multiple reasons following surgery.  Combination of anesthesia, postoperative narcotics, change in appetite and fluid intake all can affect your bowels.  In order to avoid complications following surgery, here are some recommendations in order to help you during your recovery period.  Colace (docusate) - Pick  up an over-the-counter form of Colace or another stool softener and take twice a day as long as you are requiring postoperative pain medications.  Take with a full glass of water daily.  If you experience loose stools or diarrhea, hold the colace until you stool forms back up.  If your symptoms do not get better within 1 week or if they get worse, check with your doctor.  Dulcolax (bisacodyl) - Pick up over-the-counter and take as  directed by the product packaging as needed to assist with the movement of your bowels.  Take with a full glass of water.  Use this product as needed if not relieved by Colace only.   MiraLax (polyethylene glycol) - Pick up over-the-counter to have on hand.  MiraLax is a solution that will increase the amount of water in your bowels to assist with bowel movements.  Take as directed and can mix with a glass of water, juice, soda, coffee, or tea.  Take if you go more than two days without a movement. Do not use MiraLax more than once per day. Call your doctor if you are still constipated or irregular after using this medication for 7 days in a row.  If you continue to have problems with postoperative constipation, please contact the office for further assistance and recommendations.  If you experience "the worst abdominal pain ever" or develop nausea or vomiting, please contact the office immediatly for further recommendations for treatment.  ITCHING  If you experience itching with your medications, try taking only a single pain pill, or even half a pain pill at a time.  You can also use Benadryl over the counter for itching or also to help with sleep.   TED HOSE STOCKINGS Wear the elastic stockings on both legs for three weeks following surgery during the day but you may remove then at night for sleeping.  MEDICATIONS See your medication summary on the After Visit Summary that the nursing staff will review with you prior to discharge.  You may have some home medications which will be placed on hold until you complete the course of blood thinner medication.  It is important for you to complete the blood thinner medication as prescribed by your surgeon.  Continue your approved medications as instructed at time of discharge.  PRECAUTIONS If you experience chest pain or shortness of breath - call 911 immediately for transfer to the hospital emergency department.  If you develop a fever greater that  101 F, purulent drainage from wound, increased redness or drainage from wound, foul odor from the wound/dressing, or calf pain - CONTACT YOUR SURGEON.                                                   FOLLOW-UP APPOINTMENTS Make sure you keep all of your appointments after your operation with your surgeon and caregivers. You should call the office at the above phone number and make an appointment for approximately two weeks after the date of your surgery or on the date instructed by your surgeon outlined in the "After Visit Summary".  RANGE OF MOTION AND STRENGTHENING EXERCISES  These exercises are designed to help you keep full movement of your hip joint. Follow your caregiver's or physical therapist's instructions. Perform all exercises about fifteen times, three times per day or as directed. Exercise both hips,  even if you have had only one joint replacement. These exercises can be done on a training (exercise) mat, on the floor, on a table or on a bed. Use whatever works the best and is most comfortable for you. Use music or television while you are exercising so that the exercises are a pleasant break in your day. This will make your life better with the exercises acting as a break in routine you can look forward to.  Lying on your back, slowly slide your foot toward your buttocks, raising your knee up off the floor. Then slowly slide your foot back down until your leg is straight again.  Lying on your back spread your legs as far apart as you can without causing discomfort.  Lying on your side, raise your upper leg and foot straight up from the floor as far as is comfortable. Slowly lower the leg and repeat.  Lying on your back, tighten up the muscle in the front of your thigh (quadriceps muscles). You can do this by keeping your leg straight and trying to raise your heel off the floor. This helps strengthen the largest muscle supporting your knee.  Lying on your back, tighten up the muscles of your  buttocks both with the legs straight and with the knee bent at a comfortable angle while keeping your heel on the floor.      IF YOU ARE TRANSFERRED TO A SKILLED REHAB FACILITY If the patient is transferred to a skilled rehab facility following release from the hospital, a list of the current medications will be sent to the facility for the patient to continue.  When discharged from the skilled rehab facility, please have the facility set up the patient's [place] prior to being released. Also, the skilled facility will be responsible for providing the patient with their medications at time of release from the facility to include their pain medication, the muscle relaxants, and their blood thinner medication. If the patient is still at the rehab facility at time of the two week follow up appointment, the skilled rehab facility will also need to assist the patient in arranging follow up appointment in our office and any transportation needs.  MAKE SURE YOU:  Understand these instructions.  Get help right away if you are not doing well or get worse.    Pick up stool softner and laxative for home use following surgery while on pain medications. Do not submerge incision under water. Please use good hand washing techniques while changing dressing each day. May shower starting three days after surgery. Please use a clean towel to pat the incision dry following showers. Continue to use ice for pain and swelling after surgery. Do not use any lotions or creams on the incision until instructed by your surgeon.  Take Lovenox injections for two weeks, then discontinue the Lovenox injections.  After the injections completed, then take a baby Aspirin 81 mg daily for three additional weeks.

## 2016-02-01 NOTE — Progress Notes (Addendum)
   Subjective: 3 Days Post-Op Procedure(s) (LRB): ARTHROPLASTY BIPOLAR HIP (HEMIARTHROPLASTY) (Right) Patient reports pain as mild.   Patient seen in rounds with Dr. Wynelle Link.  Family at bedside.   Patient is well, and has had no acute complaints or problems Plan is to go Skilled nursing facility after hospital stay.  Objective: Vital signs in last 24 hours: Temp:  [98.5 F (36.9 C)-99.4 F (37.4 C)] 99.2 F (37.3 C) (01/25 0459) Pulse Rate:  [81-91] 81 (01/25 0459) Resp:  [16-19] 16 (01/25 0459) BP: (132-141)/(52-76) 141/52 (01/25 0459) SpO2:  [93 %-95 %] 94 % (01/25 0459)  Intake/Output from previous day:  Intake/Output Summary (Last 24 hours) at 02/01/16 0914 Last data filed at 01/31/16 1937  Gross per 24 hour  Intake              330 ml  Output                0 ml  Net              330 ml    Intake/Output this shift: No intake/output data recorded.  Labs:  Recent Labs  01/30/16 0455 01/31/16 0519 02/01/16 0533  HGB 9.5* 9.0* 9.2*    Recent Labs  01/31/16 0519 02/01/16 0533  WBC 13.2* 12.8*  RBC 2.88* 3.00*  HCT 27.1* 27.8*  PLT 187 212    Recent Labs  01/30/16 0455 01/31/16 0519  NA 137 137  K 3.7 3.4*  CL 108 108  CO2 23 21*  BUN 21* 17  CREATININE 0.78 0.79  GLUCOSE 129* 104*  CALCIUM 8.7* 8.8*   No results for input(s): LABPT, INR in the last 72 hours.  EXAM General - Patient is Alert, Appropriate and Oriented Extremity - Neurovascular intact Sensation intact distally Dorsiflexion/Plantar flexion intact Dressing/Incision - clean, dry, no drainage Motor Function - intact, moving foot and toes well on exam.   Past Medical History:  Diagnosis Date  . 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 Essential and other specified forms of tremor   . History of fall 09/12/2015  . Memory deficit 04/03/2013  . Nodular lesion on surface of skin 07/29/2013   Right flank. Black. About 11mm diameter. Nodular.   . Other and unspecified hyperlipidemia   . Senile dementia,  uncomplicated   . Urinary frequency 04/03/2013   Nocturnal, 4-5x/night.       Assessment/Plan: 3 Days Post-Op Procedure(s) (LRB): ARTHROPLASTY BIPOLAR HIP (HEMIARTHROPLASTY) (Right) Principal Problem:   Fracture of femoral neck, right (HCC) Active Problems:   Leukocytosis   Dementia   Closed right hip fracture (HCC)   Abnormal ECG  Estimated body mass index is 21.71 kg/m as calculated from the following:   Height as of this encounter: 5\' 3"  (1.6 m).   Weight as of this encounter: 55.6 kg (122 lb 9.2 oz). Up with therapy Discharge to SNF - Family looking into [facility]  DVT Prophylaxis - Lovenox for two weeks, then reduce to a baby Aspirin 81 mg daily for three additional weeks. Weight Bearing As Tolerated right Leg Ortho RXs will be placed onto chart. F/U in two weeks after surgery. Okay for discharge from [facility].  Arlee Muslim, PA-C Orthopaedic Surgery 02/01/2016, 9:14 AM

## 2016-02-01 NOTE — Clinical Social Work Placement (Signed)
Patient is set to discharge to [facility] today. Patient & daughter, Debra [name] made aware. Discharge packet given to RN, Robert Bellow. [facility] called for transport to pickup at 12:30pm.     Raynaldo Opitz, [place] Social Worker cell #: [phone]    CLINICAL SOCIAL WORK PLACEMENT  NOTE  Date:  02/01/2016  Patient Details  Name: Debra [name] MRN: [id number] Date of Birth: 09-04-1921  Clinical Social Work is seeking post-discharge placement for this patient at the [place] level of care (*CSW will initial, date and re-position this form in  chart as items are completed):  Yes   Patient/family provided with [place] Work Department's list of facilities offering this level of care within the geographic area requested by the patient (or if unable, by the patient's family).  Yes   Patient/family informed of their freedom to choose among providers that offer the needed level of care, that participate in Medicare, Medicaid or managed care program needed by the patient, have an available bed and are willing to accept the patient.  Yes   Patient/family informed of [facility]'s ownership interest in [facility] and [facility], as well as of the fact that they are under no obligation to receive care at these facilities.  PASRR submitted to EDS on 01/31/16     PASRR number received on 01/31/16     Existing PASRR number confirmed on       FL2 transmitted to all facilities in geographic area requested by pt/family on 01/31/16     FL2 transmitted to all facilities within larger geographic area on       Patient informed that his/her managed care company has contracts with or will negotiate with certain facilities, including the following:        Yes   Patient/family informed of bed offers received.  Patient chooses bed at [facility] [place] recommends and patient chooses bed at       Patient to be transferred to [facility] at [place] on 02/01/16.  Patient to be transferred to facility by [facility]     Patient family notified on 02/01/16 of transfer.  Name of family member notified:  patient's daughter, Debra [name] at bedside     PHYSICIAN       Additional Comment:    _______________________________________________ Standley Brooking, LCSW 02/01/2016, 11:24 AM

## 2016-02-02 ENCOUNTER — Telehealth: Payer: Self-pay

## 2016-02-02 DIAGNOSIS — R262 Difficulty in walking, not elsewhere classified: Secondary | ICD-10-CM | POA: Diagnosis not present

## 2016-02-02 DIAGNOSIS — S72001D Fracture of unspecified part of neck of right femur, subsequent encounter for closed fracture with routine healing: Secondary | ICD-10-CM | POA: Diagnosis not present

## 2016-02-02 DIAGNOSIS — F0390 Unspecified dementia without behavioral disturbance: Secondary | ICD-10-CM | POA: Diagnosis not present

## 2016-02-02 DIAGNOSIS — Z9181 History of falling: Secondary | ICD-10-CM | POA: Diagnosis not present

## 2016-02-02 NOTE — Telephone Encounter (Signed)
Possible re-admission to facility. This is a patient you were seeing at [facility]. [facility] F/U is needed if patient was re-admitted to facility upon discharge. Hospital discharge from [place] on 02/01/16.

## 2016-02-13 DIAGNOSIS — S72001D Fracture of unspecified part of neck of right femur, subsequent encounter for closed fracture with routine healing: Secondary | ICD-10-CM | POA: Diagnosis not present

## 2016-02-15 ENCOUNTER — Telehealth: Payer: Self-pay

## 2016-02-15 NOTE — Telephone Encounter (Signed)
The insurance company will not cover the medication  Methocarbamol. The alternative they have requested is Tizanidine HCL- 2 MG tabs take 1 tab by mouth every 8 hours as needed for spasms.   Form was placed in providers review and sign folder.

## 2016-02-17 ENCOUNTER — Emergency Department (HOSPITAL_COMMUNITY)
Admission: EM | Admit: 2016-02-17 | Discharge: 2016-02-17 | Disposition: A | Discharge status: Home / Self Care | Payer: Medicare Other | Attending: Emergency Medicine | Admitting: Emergency Medicine

## 2016-02-17 ENCOUNTER — Emergency Department (HOSPITAL_COMMUNITY): Payer: Medicare Other

## 2016-02-17 ENCOUNTER — Encounter (HOSPITAL_COMMUNITY): Payer: Self-pay | Admitting: Emergency Medicine

## 2016-02-17 DIAGNOSIS — M545 Low back pain, unspecified: Secondary | ICD-10-CM

## 2016-02-17 DIAGNOSIS — I714 Abdominal aortic aneurysm, without rupture, unspecified: Secondary | ICD-10-CM

## 2016-02-17 DIAGNOSIS — Z96641 Presence of right artificial hip joint: Secondary | ICD-10-CM | POA: Insufficient documentation

## 2016-02-17 DIAGNOSIS — M549 Dorsalgia, unspecified: Secondary | ICD-10-CM | POA: Diagnosis not present

## 2016-02-17 DIAGNOSIS — S3992XA Unspecified injury of lower back, initial encounter: Secondary | ICD-10-CM | POA: Diagnosis present

## 2016-02-17 DIAGNOSIS — S22080A Wedge compression fracture of T11-T12 vertebra, initial encounter for closed fracture: Secondary | ICD-10-CM

## 2016-02-17 DIAGNOSIS — R51 Headache: Secondary | ICD-10-CM | POA: Diagnosis not present

## 2016-02-17 DIAGNOSIS — Y929 Unspecified place or not applicable: Secondary | ICD-10-CM | POA: Diagnosis not present

## 2016-02-17 DIAGNOSIS — M25551 Pain in right hip: Secondary | ICD-10-CM | POA: Diagnosis not present

## 2016-02-17 DIAGNOSIS — Y939 Activity, unspecified: Secondary | ICD-10-CM | POA: Insufficient documentation

## 2016-02-17 DIAGNOSIS — Y999 Unspecified external cause status: Secondary | ICD-10-CM | POA: Insufficient documentation

## 2016-02-17 DIAGNOSIS — S79911A Unspecified injury of right hip, initial encounter: Secondary | ICD-10-CM | POA: Diagnosis not present

## 2016-02-17 DIAGNOSIS — W1839XA Other fall on same level, initial encounter: Secondary | ICD-10-CM | POA: Insufficient documentation

## 2016-02-17 DIAGNOSIS — S0990XA Unspecified injury of head, initial encounter: Secondary | ICD-10-CM | POA: Insufficient documentation

## 2016-02-17 DIAGNOSIS — S22088A Other fracture of T11-T12 vertebra, initial encounter for closed fracture: Secondary | ICD-10-CM | POA: Insufficient documentation

## 2016-02-17 DIAGNOSIS — S329XXA Fracture of unspecified parts of lumbosacral spine and pelvis, initial encounter for closed fracture: Secondary | ICD-10-CM | POA: Diagnosis not present

## 2016-02-17 DIAGNOSIS — T148XXA Other injury of unspecified body region, initial encounter: Secondary | ICD-10-CM | POA: Diagnosis not present

## 2016-02-17 MED ORDER — ACETAMINOPHEN 325 MG PO TABS: 650.0000 mg | ORAL_TABLET | Freq: Four times a day (QID) | ORAL | 0 refills | Status: DC | PRN

## 2016-02-17 NOTE — ED Notes (Signed)
[facility] called for transport.  

## 2016-02-17 NOTE — ED Notes (Signed)
Bed: [id number] Expected date:  Expected time:  Means of arrival:  Comments: EMS- 83 yo back pain

## 2016-02-17 NOTE — Discharge Instructions (Signed)
He may have a small compression fracture in your thoracic spine. This may have been from either ear fall today where fall from several weeks ago. We did not find any problem with your recent hip surgery. Please take Tylenol to help with the pain. We also found evidence of a small aortic aneurysm. It is recommended that she get a ultrasound in 3 years for surveillance. If any symptoms worsen or you begin developing any abdominal pain or other symptoms, please return to the nearest emergency department.

## 2016-02-17 NOTE — ED Provider Notes (Signed)
[facility] DEPT Provider Note   CSN: [id number] Arrival date & time: 02/17/16  1055     History   Chief Complaint Chief Complaint  Patient presents with  . Back Pain    HPI Ruta Otterman is a 83 y.o. female with a past medical history significant for dementia, hyperlipidemia, and recent fall with right femur fracture status post repair who presents with a fall. Patient reports that she had a fall at 6 AM this morning when she got up to try and get out of bed. She says that she fell onto her low back and hit her head on the floor. She denied loss of consciousness but "saw stars". She denies any preceding symptoms such as chest pain, palpitations, shortness of breath. She denies any recent fevers, chills, chest pain, shortness breath, nausea, vomiting, constipation, diarrhea, or dysuria.  Patient describes her pain as in the low back and mild to moderate. Due to the fall after recent surgery, patient was brought in for evaluation to make sure there are no, patient with recent surgery. Patient denies any numbness, tingling, or weakness in the laboratories. She denies any loss of bowel or bladder function.  HPI  Past Medical History:  Diagnosis Date  . 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 Essential and other specified forms of tremor   . History of fall 09/12/2015  . Memory deficit 04/03/2013  . Nodular lesion on surface of skin 07/29/2013   Right flank. Black. About 59mm diameter. Nodular.   . Other and unspecified hyperlipidemia   . Senile dementia, uncomplicated   . Urinary frequency 04/03/2013   Nocturnal, 4-5x/night.       Patient Active Problem List   Diagnosis Date Noted  . Leukocytosis 01/29/2016  . Dementia 01/29/2016  . Closed right hip fracture ([place]) 01/29/2016  . Abnormal ECG 01/29/2016  . Fracture of femoral neck, right ([place]) 01/29/2016  . History of fall 09/12/2015  . Hallucinations 02/08/2015  . Hearing deficit 02/10/2014  . Abnormality of gait 11/25/2013  . 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 Tremor 04/03/2013  . Urinary frequency 04/03/2013  . Memory deficit 04/03/2013  . 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 Dyslipidemia 04/03/2013    Past Surgical History:  Procedure Laterality Date  . ABDOMINAL HYSTERECTOMY  1966  . CATARACT EXTRACTION Right 2013   Dr. Herbert Deaner  . excise skin cancer left upper arm Left 2013   Dr. Jari Pigg  . HIP ARTHROPLASTY Right 01/29/2016   Procedure: ARTHROPLASTY BIPOLAR HIP (HEMIARTHROPLASTY);  Surgeon: Gaynelle Arabian, MD;  Location: [facility];  Service: Orthopedics;  Laterality: Right;  . TONSILLECTOMY      OB History    No data available       Home Medications    Prior to Admission medications   Medication Sig Start Date End Date Taking? Authorizing Provider  acetaminophen (TYLENOL) 500 MG tablet Take 1,000 mg by mouth every 6 (six) hours as needed for moderate pain.   Yes Historical Provider, MD  enoxaparin (LOVENOX) 40 MG/0.4ML injection Inject 0.4 mLs (40 mg total) into the skin daily. Take Lovenox injections for two weeks, then discontinue the Lovenox injections.  After the injections completed, then take a baby Aspirin 81 mg daily for three additional weeks. 02/02/16  Yes Alexzandrew L Perkins, PA-C  HYDROcodone-acetaminophen (NORCO/VICODIN) 5-325 MG tablet Take 1-2 tablets by mouth every 6 (six) hours as needed for moderate pain. 02/01/16  Yes Alexzandrew L Perkins, PA-C  methocarbamol (ROBAXIN) 500 MG tablet Take 1 tablet (500 mg total) by mouth  every 6 (six) hours as needed for muscle spasms. 02/01/16  Yes Alexzandrew L Perkins, PA-C  ferrous fumarate (FERRETTS) 325 (106 Fe) MG TABS tablet Take 1 tablet (106 mg of iron total) by mouth 2 (two) times daily. Patient not taking: Reported on 02/17/2016 02/01/16   Doreatha Lew, MD  senna-docusate (SENOKOT-S) 8.6-50 MG tablet Take 1 tablet by mouth 2 (two) times daily. Patient not taking: Reported on 02/17/2016 02/01/16   Doreatha Lew, MD    Family History Family History  Problem Relation  Age of Onset  . CAD Mother   . Diabetes Neg Hx   . Stroke Neg Hx   . Cancer Neg Hx   . Hypertension Neg Hx     Social History Social History  Substance Use Topics  . Smoking status: Never Smoker  . Smokeless tobacco: Never Used  . Alcohol use No     Allergies   Patient has no known allergies.   Review of Systems Review of Systems  Constitutional: Negative for activity change, chills, diaphoresis, fatigue and fever.  HENT: Negative for congestion and rhinorrhea.   Eyes: Negative for visual disturbance.  Respiratory: Negative for cough, chest tightness, shortness of breath and stridor.   Cardiovascular: Negative for chest pain, palpitations and leg swelling.  Gastrointestinal: Negative for abdominal distention, abdominal pain, constipation, diarrhea, nausea and vomiting.  Genitourinary: Negative for difficulty urinating, dysuria, flank pain, frequency, hematuria, menstrual problem, pelvic pain, vaginal bleeding and vaginal discharge.  Musculoskeletal: Positive for back pain. Negative for neck pain and neck stiffness.  Skin: Negative for pallor and rash.  Neurological: Negative for dizziness, weakness, light-headedness, numbness and headaches.  Psychiatric/Behavioral: Negative for agitation and confusion.  All other systems reviewed and are negative.    Physical Exam Updated Vital Signs BP 139/89 (BP Location: Left Arm)   Pulse 77   Temp 97.9 F (36.6 C) (Oral)   Resp 16   Ht 5\' 3"  (1.6 m)   Wt 122 lb (55.3 kg)   SpO2 98%   BMI 21.61 kg/m   Physical Exam  Constitutional: She appears well-developed and well-nourished. No distress.  HENT:  Head: Normocephalic and atraumatic.  Eyes: Conjunctivae are normal.  Neck: Neck supple.  Cardiovascular: Normal rate and regular rhythm.   No murmur heard. Pulmonary/Chest: Effort normal and breath sounds normal. No respiratory distress.  Abdominal: Soft. There is no tenderness.  Musculoskeletal: She exhibits tenderness. She  exhibits no edema.       Lumbar back: She exhibits tenderness and pain.       Back:       Legs: Neurological: She is alert.  Skin: Skin is warm and dry.  Psychiatric: She has a normal mood and affect.  Nursing note and vitals reviewed.    ED Treatments / Results  Labs (all labs ordered are listed, but only abnormal results are displayed) Labs Reviewed - No data to display  EKG  EKG Interpretation None       Radiology Ct Head Wo Contrast  Result Date: 02/17/2016 CLINICAL DATA:  Pain after fall. EXAM: CT HEAD WITHOUT CONTRAST TECHNIQUE: Contiguous axial images were obtained from the base of the skull through the vertex without intravenous contrast. COMPARISON:  None. FINDINGS: Brain: No subdural, epidural, or subarachnoid hemorrhage. No mass, mass effect, or midline shift. Ventricles and sulci are mildly prominent but stable. The cerebellum, brainstem, and basal cisterns are normal. Scattered white matter changes. No acute cortical ischemia or infarct identified. Vascular: No hyperdense vessel or unexpected  calcification. Skull: Normal. Negative for fracture or focal lesion. Sinuses/Orbits: No acute finding. Other: On the scout view, there is 2 mm of anterolisthesis of C4 versus C5. No prevertebral soft tissue swelling identified. High attenuation in the left parotid gland anteriorly unchanged since January 2018. This is incompletely evaluated. IMPRESSION: 1. 2 mm of anterolisthesis of C4 versus C5 on the scout view. This is age indeterminate but the adjacent prevertebral soft tissues are grossly normal on the scout view. Recommend clinical correlation to exclude neck pain. 2. No acute intracranial abnormality. 3. High attenuation mass in the anterior left parotid gland, unchanged since January 2018, and incompletely characterized. This is of no acute significance. Electronically Signed   By: Dorise Bullion III M.D   On: 02/17/2016 13:17   Ct Lumbar Spine Wo Contrast  Result Date:  02/17/2016 CLINICAL DATA:  bilateral lower back pain. Pain does not become worse with ROM or upon palpation. Pt had hip surgery 2 weeks ago and facility wanted pt evaluated further for that reason. Pt says she did bump her head, denies dizziness or LOC prior to or after fall. Denies blood thinners, hx of alzheimers. Pt is alert and oriented to name, place, and situation. EXAM: CT LUMBAR SPINE WITHOUT CONTRAST TECHNIQUE: Multidetector CT imaging of the lumbar spine was performed without intravenous contrast administration. Multiplanar CT image reconstructions were also generated. COMPARISON:  None. FINDINGS: Segmentation: 5 non rib-bearing lumbar segments assigned L1-L5. Alignment: Mild lumbar levoscoliosis apex L4 without underlying vertebral anomaly. Vertebrae: T12 compression fracture involving primarily the superior endplate with less than 20% loss of height. Fracture lines are still sharp and evident indicating acute/ subacute injury. 32mm retropulsion without severe osseous spinal stenosis. Posterior elements intact. Paraspinal and other soft tissues: 3 cm saccular aneurysm of the infrarenal aorta. Probable parapelvic renal cysts, more numerous left than right, incompletely characterized. Disc levels: T11- L2: Unremarkable L2-3: Early central vacuum phenomenon.  Mild circumferential bulge. L3-4: Mild asymmetric narrowing with early central vacuum phenomenon. Eccentric disc bulge right greater than left. L4-5: Moderate disc narrowing right worse than left with vacuum phenomenon. Moderately advanced bilateral facet DJD allows grade 1 anterolisthesis, probably resulting in a degree of at least mild central canal stenosis. L5-S1: Early vacuum phenomenon centrally. Mild bulge. Bilateral facet DJD. IMPRESSION: 1. Acute/subacute T12 compression fracture. 2. Mild lumbar levoscoliosis with multilevel spondylitic changes most severe L4-5. 3. 3 cm saccular infrarenal abdominal aortic aneurysm. Recommend followup by  ultrasound in 3 years. This recommendation follows ACR consensus guidelines: White Paper of the ACR Incidental Findings Committee II on Vascular Findings. [facility] Radiol 2013; 10:789-794 Electronically Signed   By: Lucrezia Europe M.D.   On: 02/17/2016 13:19   Dg Hip Unilat W Or Wo Pelvis 2-3 Views Right  Result Date: 02/17/2016 CLINICAL DATA:  Pain after fall EXAM: DG HIP (WITH OR WITHOUT PELVIS) 2-3V RIGHT COMPARISON:  None. FINDINGS: The patient is status post right hip replacement. No fractures or dislocations are identified. IMPRESSION: Negative. Electronically Signed   By: Dorise Bullion III M.D   On: 02/17/2016 13:18    Procedures Procedures (including critical care time)  Medications Ordered in ED Medications - No data to display   Initial Impression / Assessment and Plan / ED Course  I have reviewed the triage vital signs and the nursing notes.  Pertinent labs & imaging results that were available during my care of the patient were reviewed by me and considered in my medical decision making (see chart for details).  Birdie Clune is a 83 y.o. female with a past medical history significant for dementia, hyperlipidemia, and recent fall with right femur fracture status post repair who presents with a fall.  History and exam are seen above.  On exam, patient's mild tenderness in her low back. Patient had no numbness, tingling, or weakness on exam. Patient had mild tenderness in the right hip area. Patient had no crepitance, fluctuance, or evidence of infection at the site. Patient had clear lungs and abdomen was nontender. No focal neurologic deficits. Patient was alert and oriented.  Based on exam, patient will have imaging of the hip, pelvis, low back, and her head due to the fall.  [facility] imaging return with results above. Patient had no evidence of fracture or dislocation of the surgical hip replaced. CT of the head showed no acute intracranial bleed. CT of the lumbar spine  showed a T12 acute/subacute compression fracture of less than 20% in height loss. Patient also had evidence of a 3 greater saccular infrarenal aortic aneurysm.  Patient was informed of all the findings. Radiology recommended a 3 year ultrasound for surveillance of AAA. Family was instructed of this. For the back tenderness, the patient did not appear to be tender in the area of T12 however her pain was slightly lower. Due to this possible subacute injury, suspect this occurred during her fall several weeks ago causing a hip fracture. Due to lack of neurologic deficits, symptoms of cauda equina, and mild pain, do not feel patient needs bracing of the spine at this time. Patient and family agreed with plan for discharge and spine follow-up. Patient's not want stronger medications than Tylenol for her pain as they are concerned that she may fall again. This was felt to be reasonable plan.  Patient discharged in good condition with no other complaints.   Final Clinical Impressions(s) / ED Diagnoses   Final diagnoses:  Acute bilateral low back pain without sciatica  T12 compression fracture ([facility])  Abdominal aortic aneurysm (AAA), 30-34 mm diameter ([place])    New Prescriptions Discharge Medication List as of 02/17/2016  3:05 PM    START taking these medications   Details  !! acetaminophen (TYLENOL) 325 MG tablet Take 2 tablets (650 mg total) by mouth every 6 (six) hours as needed., Starting Sat 02/17/2016, Until Sat 02/24/2016, Print     !! - Potential duplicate medications found. Please discuss with provider.        Clinical Impression: 1. Acute bilateral low back pain without sciatica   2. T12 compression fracture (H[facility])   3. Abdominal aortic aneurysm (AAA), 30-34 mm diameter ([facility])     Disposition: Discharge  Condition: Good  I have discussed the results, Dx and Tx plan with the pt(& family if present). He/she/they expressed understanding and agree(s) with the plan. Discharge instructions  discussed at great length. Strict return precautions discussed and pt &/or family have verbalized understanding of the instructions. No further questions at time of discharge.    Discharge Medication List as of 02/17/2016  3:05 PM    START taking these medications   Details  !! acetaminophen (TYLENOL) 325 MG tablet Take 2 tablets (650 mg total) by mouth every 6 (six) hours as needed., Starting Sat 02/17/2016, Until Sat 02/24/2016, Print     !! - Potential duplicate medications found. Please discuss with provider.      Follow Up: Estill Dooms, MD [place] [id number] [phone]     [place]  DEPT [place] [id number] [place] [phone]  If symptoms worsen '   Gwenyth Allegra Brianni Manthe, MD 02/18/16 [phone]

## 2016-02-17 NOTE — ED Triage Notes (Signed)
Per EMS, pt fell trying to go to the bathroom this morning around 6 am.  Pt was able to get back into bed but EMS was called because pt c/o bilateral lower back pain.  Pain does not become worse with ROM or upon palpation.  Pt had hip surgery 2 weeks ago and facility wanted pt evaluated further for that reason.  Pt says she did bump her head, denies dizziness or LOC prior to or after fall.  Denies blood thinners, hx of alzheimers. Pt is alert and oriented to name, place, and situation.

## 2016-02-19 ENCOUNTER — Telehealth: Payer: Self-pay

## 2016-02-19 ENCOUNTER — Other Ambulatory Visit: Payer: Self-pay | Admitting: *Deleted

## 2016-02-19 DIAGNOSIS — R488 Other symbolic dysfunctions: Secondary | ICD-10-CM | POA: Diagnosis not present

## 2016-02-19 DIAGNOSIS — Z96641 Presence of right artificial hip joint: Secondary | ICD-10-CM | POA: Diagnosis not present

## 2016-02-19 DIAGNOSIS — Z9181 History of falling: Secondary | ICD-10-CM | POA: Diagnosis not present

## 2016-02-19 DIAGNOSIS — M6281 Muscle weakness (generalized): Secondary | ICD-10-CM | POA: Diagnosis not present

## 2016-02-19 MED ORDER — HYDROCODONE-ACETAMINOPHEN 5-325 MG PO TABS: ORAL_TABLET | ORAL | 0 refills | Status: DC

## 2016-02-19 NOTE — Telephone Encounter (Signed)
3 weeks ago patient fell and broke hip. Patient had surgery, went to rehab, and returned to facility. The first night at facility patient fell again, no visible injury.   Patient c/o back pain as a result of fall. Facility nurse recommended patient follow-up with PCP.   Please advise if patient can be worked in this week. First available appointment is Friday with Dr.Carter.

## 2016-02-19 NOTE — Telephone Encounter (Signed)
[employer]. [phone]

## 2016-02-20 DIAGNOSIS — R488 Other symbolic dysfunctions: Secondary | ICD-10-CM | POA: Diagnosis not present

## 2016-02-20 DIAGNOSIS — Z9181 History of falling: Secondary | ICD-10-CM | POA: Diagnosis not present

## 2016-02-20 DIAGNOSIS — M6281 Muscle weakness (generalized): Secondary | ICD-10-CM | POA: Diagnosis not present

## 2016-02-20 DIAGNOSIS — Z96641 Presence of right artificial hip joint: Secondary | ICD-10-CM | POA: Diagnosis not present

## 2016-02-20 NOTE — Telephone Encounter (Signed)
Patient canceled with Dr.Carter tomorrow. Patient scheduled to see Dr.Carter at 2:45 pm tomorrow

## 2016-02-21 ENCOUNTER — Ambulatory Visit (INDEPENDENT_AMBULATORY_CARE_PROVIDER_SITE_OTHER): Payer: Medicare Other | Admitting: Internal Medicine

## 2016-02-21 ENCOUNTER — Encounter: Payer: Self-pay | Admitting: Internal Medicine

## 2016-02-21 VITALS — BP 128/68 | HR 78 | Temp 98.2°F | Ht 63.0 in

## 2016-02-21 DIAGNOSIS — W19XXXA Unspecified fall, initial encounter: Secondary | ICD-10-CM

## 2016-02-21 DIAGNOSIS — M6281 Muscle weakness (generalized): Secondary | ICD-10-CM | POA: Diagnosis not present

## 2016-02-21 DIAGNOSIS — R413 Other amnesia: Secondary | ICD-10-CM | POA: Diagnosis not present

## 2016-02-21 DIAGNOSIS — Z96641 Presence of right artificial hip joint: Secondary | ICD-10-CM | POA: Diagnosis not present

## 2016-02-21 DIAGNOSIS — I714 Abdominal aortic aneurysm, without rupture, unspecified: Secondary | ICD-10-CM

## 2016-02-21 DIAGNOSIS — R488 Other symbolic dysfunctions: Secondary | ICD-10-CM | POA: Diagnosis not present

## 2016-02-21 DIAGNOSIS — G4700 Insomnia, unspecified: Secondary | ICD-10-CM

## 2016-02-21 DIAGNOSIS — R269 Unspecified abnormalities of gait and mobility: Secondary | ICD-10-CM

## 2016-02-21 DIAGNOSIS — S22080A Wedge compression fracture of T11-T12 vertebra, initial encounter for closed fracture: Secondary | ICD-10-CM

## 2016-02-21 DIAGNOSIS — Z9181 History of falling: Secondary | ICD-10-CM | POA: Diagnosis not present

## 2016-02-21 DIAGNOSIS — Z9889 Other specified postprocedural states: Secondary | ICD-10-CM

## 2016-02-21 HISTORY — DX: Wedge compression fracture of T11-T12 vertebra, initial encounter for closed fracture: S22.080A

## 2016-02-21 MED ORDER — ACETAMINOPHEN 500 MG PO TABS: 1000.0000 mg | ORAL_TABLET | Freq: Three times a day (TID) | ORAL | 6 refills | Status: DC

## 2016-02-21 MED ORDER — MELATONIN 3 MG PO TABS: 1.0000 | ORAL_TABLET | Freq: Every day | ORAL | 6 refills | Status: DC

## 2016-02-21 NOTE — Progress Notes (Signed)
Patient ID: Debra [name], female   DOB: June 30, 1921, 83 y.o.   MRN: [id number]    Location:  [facility] Place of Service: OFFICE  Chief Complaint  Patient presents with  . Follow-up     Fall; question about sleep guidence; look at xrays especially with head scan; property therapy ok for hip and back; pain meds     HPI:  83 yo female seen today s/p fall x 2. She fell in Jan 2018 and sustained right hip fx. She is s/p right hip arthroplasty. She fell again last weekend and CT L spine revealed T12 acute/subacute compression fx; degenerative changes. She has dementia and daughter reports increased confusion. Prior to falls, she was independent. She is currently taking prn Tylenol for pain from recent fall. She has a little discomfort on Tylenol. She is getting PT on right hip. She has interrupted sleep x 2-3 mos and gets days and nights mixed up. Insomnia has worsened since sx to hip. She has been getting OOB and dressing in the middle of the night to walk the hallways at ALF. Daughter is interested in mild sleep aide   NOTE: CT L spine on 02/17/16 revealed 3 cm saccular infrarenal AAA. Pt has been asymptomatic. Radiology recommended [place] in 3 yrs to follow  She is a poor historian due to dementia. Hx obtained from chart and daughter  Past Medical History:  Diagnosis Date  . 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 Essential and other specified forms of tremor   . History of fall 09/12/2015  . Memory deficit 04/03/2013  . Nodular lesion on surface of skin 07/29/2013   Right flank. Black. About 68m diameter. Nodular.   . Other and unspecified hyperlipidemia   . Senile dementia, uncomplicated   . Urinary frequency 04/03/2013   Nocturnal, 4-5x/night.       Past Surgical History:  Procedure Laterality Date  . ABDOMINAL HYSTERECTOMY  1966  . CATARACT EXTRACTION Right 2013   Dr. HHerbert Deaner . excise skin cancer left upper arm Left 2013   Dr. KJari Pigg . HIP ARTHROPLASTY Right 01/29/2016   Procedure: ARTHROPLASTY BIPOLAR HIP  (HEMIARTHROPLASTY);  Surgeon: FGaynelle Arabian MD;  Location: [facility];  Service: Orthopedics;  Laterality: Right;  . TONSILLECTOMY      Patient Care Team: AEstill Dooms MD as PCP - General (Internal Medicine) Man XOtho Darner NP as Nurse Practitioner (Nurse Practitioner) KMonna Fam MD as Consulting Physician (Ophthalmology) KJari Pigg MD as Consulting Physician (Dermatology)  Social History   Social History  . Marital status: Widowed    Spouse name: N/A  . Number of children: N/A  . Years of education: N/A   Occupational History  . Not on file.   Social History Main Topics  . Smoking status: Never Smoker  . Smokeless tobacco: Never Used  . Alcohol use No  . Drug use: No  . Sexual activity: No   Other Topics Concern  . Not on file   Social History Narrative   [facility]   Married   Living Will   Exercise: walks daily , walks with cane   Does not drink any alcohol    Never smoked   Caffeine 2 drinks daily      reports that she has never smoked. She has never used smokeless tobacco. She reports that she does not drink alcohol or use drugs.  Family History  Problem Relation Age of Onset  . CAD Mother   . Diabetes Neg  Hx   . Stroke Neg Hx   . Cancer Neg Hx   . Hypertension Neg Hx    Family Status  Relation Status  . Mother Deceased at age 57   old age  . Father Deceased at age 25   old age  . Sister Deceased at age 68  . Brother Deceased at age 52  . Daughter Alive  . Son Alive  . Sister Alive  . Son Alive  . Son Alive  . Son Alive  . Neg Hx      No Known Allergies  Medications: Patient's Medications  New Prescriptions   No medications on file  Previous Medications   ACETAMINOPHEN (TYLENOL) 325 MG TABLET    Take 2 tablets (650 mg total) by mouth every 6 (six) hours as needed.   ACETAMINOPHEN (TYLENOL) 500 MG TABLET    Take 1,000 mg by mouth every 6 (six) hours as needed for moderate pain.   ASPIRIN 81 MG  TABLET    Take by mouth daily.  Modified Medications   No medications on file  Discontinued Medications   ENOXAPARIN (LOVENOX) 40 MG/0.4ML INJECTION    Inject 0.4 mLs (40 mg total) into the skin daily. Take Lovenox injections for two weeks, then discontinue the Lovenox injections.  After the injections completed, then take a baby Aspirin 81 mg daily for three additional weeks.   FERROUS FUMARATE (FERRETTS) 325 (106 FE) MG TABS TABLET    Take 1 tablet (106 mg of iron total) by mouth 2 (two) times daily.   HYDROCODONE-ACETAMINOPHEN (NORCO/VICODIN) 5-325 MG TABLET    Take one tablet by mouth every 6 hours as needed for pain. Do not exceed 3gm of APAP in 24 hours   METHOCARBAMOL (ROBAXIN) 500 MG TABLET    Take 1 tablet (500 mg total) by mouth every 6 (six) hours as needed for muscle spasms.   SENNA-DOCUSATE (SENOKOT-S) 8.6-50 MG TABLET    Take 1 tablet by mouth 2 (two) times daily.    Review of Systems  Unable to perform ROS: Dementia    Vitals:   02/21/16 1447  BP: 128/68  Pulse: 78  Temp: 98.2 F (36.8 C)  SpO2: 96%  Height: 5' 3"  (1.6 m)   There is no height or weight on file to calculate BMI.  Physical Exam  Constitutional: She appears well-developed and well-nourished.  Cardiovascular: Normal rate, regular rhythm, normal heart sounds and intact distal pulses.  Exam reveals no gallop and no friction rub.   No murmur heard. +1 pitting R>LLE edema. No calf TTP. TED stockings intact  Pulmonary/Chest: No respiratory distress. She has no wheezes. She has no rales. She exhibits no tenderness.  Musculoskeletal: She exhibits edema. She exhibits no deformity.       Lumbar back: She exhibits decreased range of motion, swelling and spasm. She exhibits no tenderness, no bony tenderness and no deformity.       Back:  Right proximal lateral thigh incisional scar noted with no signs of secondary infection or wound dehiscence  Neurological: She is alert.  Skin: Skin is warm and dry. No rash  noted.  Psychiatric: She has a normal mood and affect. Her behavior is normal.     Labs reviewed: Admission on 01/28/2016, Discharged on 02/01/2016  Component Date Value Ref Range Status  . WBC 01/28/2016 14.5* 4.0 - 10.5 K/uL Final  . RBC 01/28/2016 3.67* 3.87 - 5.11 MIL/uL Final  . Hemoglobin 01/28/2016 11.2* 12.0 - 15.0 g/dL Final  .  HCT 01/28/2016 33.7* 36.0 - 46.0 % Final  . MCV 01/28/2016 91.8  78.0 - 100.0 fL Final  . MCH 01/28/2016 30.5  26.0 - 34.0 pg Final  . MCHC 01/28/2016 33.2  30.0 - 36.0 g/dL Final  . RDW 01/28/2016 13.4  11.5 - 15.5 % Final  . Platelets 01/28/2016 210  150 - 400 K/uL Final  . Neutrophils Relative % 01/28/2016 78  % Final  . Neutro Abs 01/28/2016 11.3* 1.7 - 7.7 K/uL Final  . Lymphocytes Relative 01/28/2016 14  % Final  . Lymphs Abs 01/28/2016 2.0  0.7 - 4.0 K/uL Final  . Monocytes Relative 01/28/2016 8  % Final  . Monocytes Absolute 01/28/2016 1.2* 0.1 - 1.0 K/uL Final  . Eosinophils Relative 01/28/2016 0  % Final  . Eosinophils Absolute 01/28/2016 0.0  0.0 - 0.7 K/uL Final  . Basophils Relative 01/28/2016 0  % Final  . Basophils Absolute 01/28/2016 0.0  0.0 - 0.1 K/uL Final  . Sodium 01/29/2016 136  135 - 145 mmol/L Final  . Potassium 01/29/2016 4.0  3.5 - 5.1 mmol/L Final  . Chloride 01/29/2016 105  101 - 111 mmol/L Final  . BUN 01/29/2016 28* 6 - 20 mg/dL Final  . Creatinine, Ser 01/29/2016 0.80  0.44 - 1.00 mg/dL Final  . Glucose, Bld 01/29/2016 174* 65 - 99 mg/dL Final  . Calcium, Ion 01/29/2016 1.30  1.15 - 1.40 mmol/L Final  . TCO2 01/29/2016 21  0 - 100 mmol/L Final  . Hemoglobin 01/29/2016 11.6* 12.0 - 15.0 g/dL Final  . HCT 01/29/2016 34.0* 36.0 - 46.0 % Final  . Color, Urine 01/29/2016 YELLOW  YELLOW Final  . APPearance 01/29/2016 CLEAR  CLEAR Final  . Specific Gravity, Urine 01/29/2016 1.017  1.005 - 1.030 Final  . pH 01/29/2016 6.0  5.0 - 8.0 Final  . Glucose, UA 01/29/2016 50* NEGATIVE mg/dL Final  . Hgb urine dipstick  01/29/2016 SMALL* NEGATIVE Final  . Bilirubin Urine 01/29/2016 NEGATIVE  NEGATIVE Final  . Ketones, ur 01/29/2016 NEGATIVE  NEGATIVE mg/dL Final  . Protein, ur 01/29/2016 NEGATIVE  NEGATIVE mg/dL Final  . Nitrite 01/29/2016 NEGATIVE  NEGATIVE Final  . Leukocytes, UA 01/29/2016 NEGATIVE  NEGATIVE Final  . RBC / HPF 01/29/2016 6-30  0 - 5 RBC/hpf Final  . WBC, UA 01/29/2016 0-5  0 - 5 WBC/hpf Final  . Bacteria, UA 01/29/2016 NONE SEEN  NONE SEEN Final  . Squamous Epithelial / LPF 01/29/2016 0-5* NONE SEEN Final  . Mucous 01/29/2016 PRESENT   Final  . Troponin I 01/29/2016 <0.03  <0.03 ng/mL Final  . Troponin I 01/29/2016 0.04* <0.03 ng/mL Final   Comment: CRITICAL RESULT CALLED TO, READ BACK BY AND VERIFIED WITH: CARPENTER,D RN AT 1430 01/29/16 COHEN,K   . WBC 01/29/2016 14.7* 4.0 - 10.5 K/uL Final  . RBC 01/29/2016 3.74* 3.87 - 5.11 MIL/uL Final  . Hemoglobin 01/29/2016 11.3* 12.0 - 15.0 g/dL Final  . HCT 01/29/2016 34.7* 36.0 - 46.0 % Final  . MCV 01/29/2016 92.8  78.0 - 100.0 fL Final  . MCH 01/29/2016 30.2  26.0 - 34.0 pg Final  . MCHC 01/29/2016 32.6  30.0 - 36.0 g/dL Final  . RDW 01/29/2016 13.5  11.5 - 15.5 % Final  . Platelets 01/29/2016 226  150 - 400 K/uL Final  . Sodium 01/29/2016 136  135 - 145 mmol/L Final  . Potassium 01/29/2016 3.8  3.5 - 5.1 mmol/L Final  . Chloride 01/29/2016 104  101 - 111  mmol/L Final  . CO2 01/29/2016 26  22 - 32 mmol/L Final  . Glucose, Bld 01/29/2016 117* 65 - 99 mg/dL Final  . BUN 01/29/2016 26* 6 - 20 mg/dL Final  . Creatinine, Ser 01/29/2016 0.85  0.44 - 1.00 mg/dL Final  . Calcium 01/29/2016 9.6  8.9 - 10.3 mg/dL Final  . GFR calc non Af Amer 01/29/2016 58* >60 mL/min Final  . GFR calc Af Amer 01/29/2016 >60  >60 mL/min Final   Comment: (NOTE) The eGFR has been calculated using the CKD EPI equation. This calculation has not been validated in all clinical situations. eGFR's persistently <60 mL/min signify possible Chronic  Kidney Disease.   . Anion gap 01/29/2016 6  5 - 15 Final  . ABO/RH(D) 01/29/2016 A POS   Final  . Antibody Screen 01/29/2016 NEG   Final  . Sample Expiration 01/29/2016 02/01/2016   Final  . Albumin 01/29/2016 3.6  3.5 - 5.0 g/dL Final  . Vit D, 25-Hydroxy 01/29/2016 14.8* 30.0 - 100.0 ng/mL Final   Comment: (NOTE) Vitamin D deficiency has been defined by the [place] practice guideline as a level of serum 25-OH vitamin D less than 20 ng/mL (1,2). The Endocrine Society went on to further define vitamin D insufficiency as a level between 21 and 29 ng/mL (2). 1. IOM (Institute of Medicine). 2010. Dietary reference   intakes for calcium and D. [place]: The   [employer]. 2. Holick MF, Binkley [place], Bischoff-Ferrari HA, et al.   Evaluation, treatment, and prevention of vitamin D   deficiency: an Endocrine Society clinical practice   guideline. JCEM. 2011 Jul; 96(7):1911-30. Performed At: [facility] [place], [place] [id number] Lindon Romp MD AL:[id number]   . MRSA, PCR 01/29/2016 NEGATIVE  NEGATIVE Final  . Staphylococcus aureus 01/29/2016 NEGATIVE  NEGATIVE Final   Comment:        The Xpert SA Assay (FDA approved for NASAL specimens in patients over 48 years of age), is one component of a comprehensive surveillance program.  Test performance has been validated by [facility] for patients greater than or equal to 51 year old. It is not intended to diagnose infection nor to guide or monitor treatment.   . ABO/RH(D) 01/29/2016 A POS   Final  . TSH 01/29/2016 1.336  0.350 - 4.500 uIU/mL Final  . WBC 01/30/2016 12.3* 4.0 - 10.5 K/uL Final  . RBC 01/30/2016 3.14* 3.87 - 5.11 MIL/uL Final  . Hemoglobin 01/30/2016 9.5* 12.0 - 15.0 g/dL Final  . HCT 01/30/2016 29.1* 36.0 - 46.0 % Final  . MCV 01/30/2016 92.7  78.0 - 100.0 fL Final  . MCH 01/30/2016 30.3  26.0 - 34.0 pg Final  . MCHC 01/30/2016  32.6  30.0 - 36.0 g/dL Final  . RDW 01/30/2016 13.5  11.5 - 15.5 % Final  . Platelets 01/30/2016 199  150 - 400 K/uL Final  . Sodium 01/30/2016 137  135 - 145 mmol/L Final  . Potassium 01/30/2016 3.7  3.5 - 5.1 mmol/L Final  . Chloride 01/30/2016 108  101 - 111 mmol/L Final  . CO2 01/30/2016 23  22 - 32 mmol/L Final  . Glucose, Bld 01/30/2016 129* 65 - 99 mg/dL Final  . BUN 01/30/2016 21* 6 - 20 mg/dL Final  . Creatinine, Ser 01/30/2016 0.78  0.44 - 1.00 mg/dL Final  . Calcium 01/30/2016 8.7* 8.9 - 10.3 mg/dL Final  . GFR calc non Af Amer 01/30/2016 >  60  >60 mL/min Final  . GFR calc Af Amer 01/30/2016 >60  >60 mL/min Final   Comment: (NOTE) The eGFR has been calculated using the CKD EPI equation. This calculation has not been validated in all clinical situations. eGFR's persistently <60 mL/min signify possible Chronic Kidney Disease.   . Anion gap 01/30/2016 6  5 - 15 Final  . WBC 01/31/2016 13.2* 4.0 - 10.5 K/uL Final  . RBC 01/31/2016 2.88* 3.87 - 5.11 MIL/uL Final  . Hemoglobin 01/31/2016 9.0* 12.0 - 15.0 g/dL Final  . HCT 01/31/2016 27.1* 36.0 - 46.0 % Final  . MCV 01/31/2016 94.1  78.0 - 100.0 fL Final  . MCH 01/31/2016 31.3  26.0 - 34.0 pg Final  . MCHC 01/31/2016 33.2  30.0 - 36.0 g/dL Final  . RDW 01/31/2016 13.4  11.5 - 15.5 % Final  . Platelets 01/31/2016 187  150 - 400 K/uL Final  . Sodium 01/31/2016 137  135 - 145 mmol/L Final  . Potassium 01/31/2016 3.4* 3.5 - 5.1 mmol/L Final  . Chloride 01/31/2016 108  101 - 111 mmol/L Final  . CO2 01/31/2016 21* 22 - 32 mmol/L Final  . Glucose, Bld 01/31/2016 104* 65 - 99 mg/dL Final  . BUN 01/31/2016 17  6 - 20 mg/dL Final  . Creatinine, Ser 01/31/2016 0.79  0.44 - 1.00 mg/dL Final  . Calcium 01/31/2016 8.8* 8.9 - 10.3 mg/dL Final  . GFR calc non Af Amer 01/31/2016 >60  >60 mL/min Final  . GFR calc Af Amer 01/31/2016 >60  >60 mL/min Final   Comment: (NOTE) The eGFR has been calculated using the CKD EPI equation. This  calculation has not been validated in all clinical situations. eGFR's persistently <60 mL/min signify possible Chronic Kidney Disease.   . Anion gap 01/31/2016 8  5 - 15 Final  . WBC 02/01/2016 12.8* 4.0 - 10.5 K/uL Final  . RBC 02/01/2016 3.00* 3.87 - 5.11 MIL/uL Final  . Hemoglobin 02/01/2016 9.2* 12.0 - 15.0 g/dL Final  . HCT 02/01/2016 27.8* 36.0 - 46.0 % Final  . MCV 02/01/2016 92.7  78.0 - 100.0 fL Final  . MCH 02/01/2016 30.7  26.0 - 34.0 pg Final  . MCHC 02/01/2016 33.1  30.0 - 36.0 g/dL Final  . RDW 02/01/2016 13.4  11.5 - 15.5 % Final  . Platelets 02/01/2016 212  150 - 400 K/uL Final    Dg Chest 1 View  Result Date: 01/29/2016 CLINICAL DATA:  83 year old female with fall and right hip injury. EXAM: CHEST 1 VIEW COMPARISON:  None. FINDINGS: The lungs are clear. There is no pleural effusion or pneumothorax. The cardiac silhouette is within normal limits. There is atherosclerotic calcification of the aortic arch. There is osteopenia with degenerative changes of the spine and shoulders. No acute fracture. IMPRESSION: No active disease. Electronically Signed   By: Anner Crete M.D.   On: 01/29/2016 00:37   Ct Head Wo Contrast  Result Date: 02/17/2016 CLINICAL DATA:  Pain after fall. EXAM: CT HEAD WITHOUT CONTRAST TECHNIQUE: Contiguous axial images were obtained from the base of the skull through the vertex without intravenous contrast. COMPARISON:  None. FINDINGS: Brain: No subdural, epidural, or subarachnoid hemorrhage. No mass, mass effect, or midline shift. Ventricles and sulci are mildly prominent but stable. The cerebellum, brainstem, and basal cisterns are normal. Scattered white matter changes. No acute cortical ischemia or infarct identified. Vascular: No hyperdense vessel or unexpected calcification. Skull: Normal. Negative for fracture or focal lesion. Sinuses/Orbits: No acute finding.  Other: On the scout view, there is 2 mm of anterolisthesis of C4 versus C5. No prevertebral  soft tissue swelling identified. High attenuation in the left parotid gland anteriorly unchanged since January 2018. This is incompletely evaluated. IMPRESSION: 1. 2 mm of anterolisthesis of C4 versus C5 on the scout view. This is age indeterminate but the adjacent prevertebral soft tissues are grossly normal on the scout view. Recommend clinical correlation to exclude neck pain. 2. No acute intracranial abnormality. 3. High attenuation mass in the anterior left parotid gland, unchanged since January 2018, and incompletely characterized. This is of no acute significance. Electronically Signed   By: Dorise Bullion III M.D   On: 02/17/2016 13:17   Ct Head Wo Contrast  Result Date: 01/29/2016 CLINICAL DATA:  83 year old female with fall. EXAM: CT HEAD WITHOUT CONTRAST CT CERVICAL SPINE WITHOUT CONTRAST TECHNIQUE: Multidetector CT imaging of the head and cervical spine was performed following the standard protocol without intravenous contrast. Multiplanar CT image reconstructions of the cervical spine were also generated. COMPARISON:  None. FINDINGS: CT HEAD FINDINGS Brain: There is mild age-related atrophy and chronic microvascular ischemic changes. There is no acute intracranial hemorrhage. No mass effect or midline shift noted. No extra-axial fluid collection. There is a 7 x 10 mm dural based right anterior parafalcine extra-axial lesion most compatible with a meningioma. Vascular: No hyperdense vessel or unexpected calcification. Skull: Normal. Negative for fracture or focal lesion. Sinuses/Orbits: No acute finding. Other: None CT CERVICAL SPINE FINDINGS Alignment: No acute subluxation. There is reversal of normal cervical lordosis most prominent at C4-C5. This may be related to muscle spasm or degenerative changes. Skull base and vertebrae: No acute fracture. No primary bone lesion or focal pathologic process. Soft tissues and spinal canal: Bilateral intra parotid lesions measuring up to 18 x 18 mm on the  left. Ultrasound may provide better evaluation. There is heterogeneity of the thyroid gland with an 11 x 8 mm right lobe nodule. Disc levels: Multilevel degenerative changes. There is grade 1 C4-C5 anterolisthesis. Disc space narrowing most prominent at C5-C6 and C6-C7. Upper chest: Mild interlobular septal thickening may represent a degree of interstitial edema. Other: Atherosclerotic calcification of the aortic arch. There is mild asymmetric dilatation of the right internal jugular vein. IMPRESSION: No acute intracranial hemorrhage. Age-related atrophy and chronic microvascular ischemic changes. Small right anterior parafalcine meningioma. No acute/traumatic cervical spine pathology. Electronically Signed   By: Anner Crete M.D.   On: 01/29/2016 02:27   Ct Cervical Spine Wo Contrast  Result Date: 01/29/2016 CLINICAL DATA:  83 year old female with fall. EXAM: CT HEAD WITHOUT CONTRAST CT CERVICAL SPINE WITHOUT CONTRAST TECHNIQUE: Multidetector CT imaging of the head and cervical spine was performed following the standard protocol without intravenous contrast. Multiplanar CT image reconstructions of the cervical spine were also generated. COMPARISON:  None. FINDINGS: CT HEAD FINDINGS Brain: There is mild age-related atrophy and chronic microvascular ischemic changes. There is no acute intracranial hemorrhage. No mass effect or midline shift noted. No extra-axial fluid collection. There is a 7 x 10 mm dural based right anterior parafalcine extra-axial lesion most compatible with a meningioma. Vascular: No hyperdense vessel or unexpected calcification. Skull: Normal. Negative for fracture or focal lesion. Sinuses/Orbits: No acute finding. Other: None CT CERVICAL SPINE FINDINGS Alignment: No acute subluxation. There is reversal of normal cervical lordosis most prominent at C4-C5. This may be related to muscle spasm or degenerative changes. Skull base and vertebrae: No acute fracture. No primary bone lesion or  focal pathologic  process. Soft tissues and spinal canal: Bilateral intra parotid lesions measuring up to 18 x 18 mm on the left. Ultrasound may provide better evaluation. There is heterogeneity of the thyroid gland with an 11 x 8 mm right lobe nodule. Disc levels: Multilevel degenerative changes. There is grade 1 C4-C5 anterolisthesis. Disc space narrowing most prominent at C5-C6 and C6-C7. Upper chest: Mild interlobular septal thickening may represent a degree of interstitial edema. Other: Atherosclerotic calcification of the aortic arch. There is mild asymmetric dilatation of the right internal jugular vein. IMPRESSION: No acute intracranial hemorrhage. Age-related atrophy and chronic microvascular ischemic changes. Small right anterior parafalcine meningioma. No acute/traumatic cervical spine pathology. Electronically Signed   By: Anner Crete M.D.   On: 01/29/2016 02:27   Ct Lumbar Spine Wo Contrast  Result Date: 02/17/2016 CLINICAL DATA:  bilateral lower back pain. Pain does not become worse with ROM or upon palpation. Pt had hip surgery 2 weeks ago and facility wanted pt evaluated further for that reason. Pt says she did bump her head, denies dizziness or LOC prior to or after fall. Denies blood thinners, hx of alzheimers. Pt is alert and oriented to name, place, and situation. EXAM: CT LUMBAR SPINE WITHOUT CONTRAST TECHNIQUE: Multidetector CT imaging of the lumbar spine was performed without intravenous contrast administration. Multiplanar CT image reconstructions were also generated. COMPARISON:  None. FINDINGS: Segmentation: 5 non rib-bearing lumbar segments assigned L1-L5. Alignment: Mild lumbar levoscoliosis apex L4 without underlying vertebral anomaly. Vertebrae: T12 compression fracture involving primarily the superior endplate with less than 20% loss of height. Fracture lines are still sharp and evident indicating acute/ subacute injury. 12m retropulsion without severe osseous spinal stenosis.  Posterior elements intact. Paraspinal and other soft tissues: 3 cm saccular aneurysm of the infrarenal aorta. Probable parapelvic renal cysts, more numerous left than right, incompletely characterized. Disc levels: T11- L2: Unremarkable L2-3: Early central vacuum phenomenon.  Mild circumferential bulge. L3-4: Mild asymmetric narrowing with early central vacuum phenomenon. Eccentric disc bulge right greater than left. L4-5: Moderate disc narrowing right worse than left with vacuum phenomenon. Moderately advanced bilateral facet DJD allows grade 1 anterolisthesis, probably resulting in a degree of at least mild central canal stenosis. L5-S1: Early vacuum phenomenon centrally. Mild bulge. Bilateral facet DJD. IMPRESSION: 1. Acute/subacute T12 compression fracture. 2. Mild lumbar levoscoliosis with multilevel spondylitic changes most severe L4-5. 3. 3 cm saccular infrarenal abdominal aortic aneurysm. Recommend followup by ultrasound in 3 years. This recommendation follows ACR consensus guidelines: White Paper of the ACR Incidental Findings Committee II on Vascular Findings. JNatasha MeadColl Radiol 2013; 10:789-794 Electronically Signed   By: DLucrezia EuropeM.D.   On: 02/17/2016 13:19   Pelvis Portable  Result Date: 01/29/2016 CLINICAL DATA:  83year old female status post right hip replacement EXAM: PORTABLE PELVIS 1-2 VIEWS COMPARISON:  01/28/2016 FINDINGS: Interval surgical changes of right hemiarthroplasty. Femoral component appears located projecting over the right acetabulum. Surgical drain in place. Soft tissue swelling with small amount of gas in the surgical bed. Degenerative changes of the left hip. IMPRESSION: Interval surgical changes of right hip hemiarthroplasty, with surgical drain projecting at the surgical site. Signed, JDulcy Fanny WEarleen Newport DO [facility] Electronically Signed   By: JCorrie MckusickD.O.   On: 01/29/2016 18:45   Dg Hip Unilat W Or Wo Pelvis  2-3 Views Right  Result Date: 02/17/2016 CLINICAL DATA:  Pain after fall EXAM: DG HIP (WITH OR WITHOUT PELVIS) 2-3V RIGHT COMPARISON:  None. FINDINGS: The  patient is status post right hip replacement. No fractures or dislocations are identified. IMPRESSION: Negative. Electronically Signed   By: Dorise Bullion III M.D   On: 02/17/2016 13:18   Dg Hip Unilat W Or Wo Pelvis 2-3 Views Right  Result Date: 01/29/2016 CLINICAL DATA:  83 year old female with fall and right hip pain. EXAM: DG HIP (WITH OR WITHOUT PELVIS) 2-3V RIGHT COMPARISON:  Abdominal radiograph dated 11/27/2008 FINDINGS: There is a mildly displaced fracture of the right femoral neck with mild proximal migration of the femoral shaft in relation to the head of the femur. The femoral head remains in anatomic alignment with the acetabulum. There is no dislocation. The bones are osteopenic. The soft tissues appear unremarkable. IMPRESSION: Mildly displaced fracture of the right femoral neck. Electronically Signed   By: Anner Crete M.D.   On: 01/29/2016 00:36     Assessment/Plan   ICD-9-CM ICD-10-CM   1. T12 compression fracture (HCC) 805.2 S22.080A   2. Insomnia, unspecified type 780.52 G47.00   3. Memory deficit 780.93 R41.3   4. Fall, initial encounter [phone] W19.XXXA   5. Status post hip surgery V45.89 Z98.890    right hip arthroplasty  6. Abnormality of gait 781.2 R26.9   7. Abdominal aortic aneurysm (AAA) without rupture (HCC) 441.4 I71.4    3 cm saccular infrarenal noted on CT lumbar spine    Recommend observation of AAA at this time due to age and dementia. She is likely not a surgical candidate. Daughter agreeable  START MELATONIN 3 mg at bedtime to help sleep. Take until follow up with Dr Nyoka Cowden in March  Will change Tylenol ES 556m to 3 times daily until follow up with Dr GNyoka Cowdenin March. May need additional treatment modalities if pain and/or disability persists  Continue PT for strength training, gait training,  balance and transfers.   Follow up as scheduled with [facility]edell Noss [facility] [address] [place] [phone] (Monday-Friday 8 AM - 5 PM) ([phone] 5 PM and follow prompts

## 2016-02-21 NOTE — Patient Instructions (Addendum)
START MELATONIN 3 mg at bedtime to help sleep. Take until follow up with Dr Nyoka Cowden in March  Will change Tylenol ES 500mg  to 3 times daily until follow up with Dr Nyoka Cowden in March  Continue PT for strength training, gait training, balance and transfers.   Follow up as scheduled with Dr Nyoka Cowden   Vertebral Fracture Introduction A vertebral fracture means that one of the bones in the spine is broken (fractured). These bones are called vertebrae. You may have back pain that gets worse when you move. Vertebral fractures can be mild or severe. Many will get better without surgery. Surgery may be needed for more severe breaks. Follow these instructions at home: General instructions  Take medicines only as told by your doctor.  Do not drive or use heavy machinery while taking pain medicine.  If told, put ice on the injured area:  Put ice in a plastic bag.  Place a towel between your skin and the bag.  Leave the ice on for 30 minutes every 2 hours at first. Then use the ice as needed.  Do not drink alcohol.  Keep all follow-up visits as told by your doctor. This is important. Activity  Stay in bed (on bed rest) only as told by your doctor. Being on bed rest for too long can make your condition worse.  Return to your normal activities as told by your doctor. Ask your doctor what is safe for you to do.  Do exercises for your back (physical therapy) as told by your doctor.  Exercise often as told by your doctor. Contact a doctor if:  You have a fever.  You have a cough that makes your pain worse.  Your pain medicine is not helping.  Your pain does not get better over time.  You cannot return to your normal activities as planned. Get help right away if:  Your pain is very bad and it suddenly gets worse.  You are not able to move any body part (paralysis) that is below the level of your injury.  You have numbness, tingling, or weakness in any body part that is below the level  of your injury.  You cannot control when you pee (urinate) or when you poop (have bowel movements). This information is not intended to replace advice given to you by your health care provider. Make sure you discuss any questions you have with your health care provider. Document Released: 06/13/2009 Document Revised: 06/01/2015 Document Reviewed: 12/29/2013  2017 Elsevier

## 2016-02-22 DIAGNOSIS — R488 Other symbolic dysfunctions: Secondary | ICD-10-CM | POA: Diagnosis not present

## 2016-02-22 DIAGNOSIS — M6281 Muscle weakness (generalized): Secondary | ICD-10-CM | POA: Diagnosis not present

## 2016-02-22 DIAGNOSIS — Z96641 Presence of right artificial hip joint: Secondary | ICD-10-CM | POA: Diagnosis not present

## 2016-02-22 DIAGNOSIS — Z9181 History of falling: Secondary | ICD-10-CM | POA: Diagnosis not present

## 2016-02-23 ENCOUNTER — Ambulatory Visit: Payer: Self-pay | Admitting: Internal Medicine

## 2016-02-23 DIAGNOSIS — Z96641 Presence of right artificial hip joint: Secondary | ICD-10-CM | POA: Diagnosis not present

## 2016-02-23 DIAGNOSIS — M6281 Muscle weakness (generalized): Secondary | ICD-10-CM | POA: Diagnosis not present

## 2016-02-23 DIAGNOSIS — R488 Other symbolic dysfunctions: Secondary | ICD-10-CM | POA: Diagnosis not present

## 2016-02-23 DIAGNOSIS — Z9181 History of falling: Secondary | ICD-10-CM | POA: Diagnosis not present

## 2016-02-26 ENCOUNTER — Telehealth: Payer: Self-pay | Admitting: *Deleted

## 2016-02-26 DIAGNOSIS — R488 Other symbolic dysfunctions: Secondary | ICD-10-CM | POA: Diagnosis not present

## 2016-02-26 DIAGNOSIS — Z9181 History of falling: Secondary | ICD-10-CM | POA: Diagnosis not present

## 2016-02-26 DIAGNOSIS — M6281 Muscle weakness (generalized): Secondary | ICD-10-CM | POA: Diagnosis not present

## 2016-02-26 DIAGNOSIS — Z96641 Presence of right artificial hip joint: Secondary | ICD-10-CM | POA: Diagnosis not present

## 2016-02-26 NOTE — Telephone Encounter (Signed)
Patient daughter, [name] called and stated that they saw you last week and you prescribed Melatonin for patient to help calm her at night to sleep. The family sees no difference at all and wants to know if you will prescribe something different to help calm her and help her rest at night. Please Advise.

## 2016-02-26 NOTE — Telephone Encounter (Signed)
I recommend they further discuss sleep aide with Dr Nyoka Cowden as he is her PCP and knows what may work best for her.

## 2016-02-27 ENCOUNTER — Encounter: Payer: Self-pay | Admitting: Internal Medicine

## 2016-02-27 ENCOUNTER — Other Ambulatory Visit: Payer: Self-pay | Admitting: *Deleted

## 2016-02-27 ENCOUNTER — Other Ambulatory Visit: Payer: Self-pay | Admitting: Internal Medicine

## 2016-02-27 DIAGNOSIS — M6281 Muscle weakness (generalized): Secondary | ICD-10-CM | POA: Diagnosis not present

## 2016-02-27 DIAGNOSIS — Z9181 History of falling: Secondary | ICD-10-CM | POA: Diagnosis not present

## 2016-02-27 DIAGNOSIS — R488 Other symbolic dysfunctions: Secondary | ICD-10-CM | POA: Diagnosis not present

## 2016-02-27 DIAGNOSIS — S22080A Wedge compression fracture of T11-T12 vertebra, initial encounter for closed fracture: Secondary | ICD-10-CM

## 2016-02-27 DIAGNOSIS — Z96641 Presence of right artificial hip joint: Secondary | ICD-10-CM | POA: Diagnosis not present

## 2016-02-27 MED ORDER — HYDROCODONE-ACETAMINOPHEN 5-325 MG PO TABS: ORAL_TABLET | ORAL | 0 refills | Status: DC

## 2016-02-27 MED ORDER — MIRTAZAPINE 15 MG PO TABS: ORAL_TABLET | ORAL | 3 refills | Status: DC

## 2016-02-27 NOTE — Telephone Encounter (Signed)
Routed to Dr. Green

## 2016-02-27 NOTE — Telephone Encounter (Signed)
Patient daughter, [name] notified and agreed. Wants Rx faxed to [facility] [phone]  Fax: [phone]. Printed and given to Dr. Nyoka Cowden to sign and will fax to facility.

## 2016-02-27 NOTE — Telephone Encounter (Signed)
Rx: Mirtazapine 15 mg (30) one nightly to help sleep

## 2016-02-28 DIAGNOSIS — Z96641 Presence of right artificial hip joint: Secondary | ICD-10-CM | POA: Diagnosis not present

## 2016-02-28 DIAGNOSIS — Z9181 History of falling: Secondary | ICD-10-CM | POA: Diagnosis not present

## 2016-02-28 DIAGNOSIS — R488 Other symbolic dysfunctions: Secondary | ICD-10-CM | POA: Diagnosis not present

## 2016-02-28 DIAGNOSIS — M6281 Muscle weakness (generalized): Secondary | ICD-10-CM | POA: Diagnosis not present

## 2016-02-28 MED ORDER — MIRTAZAPINE 15 MG PO TABS: ORAL_TABLET | ORAL | 3 refills | Status: DC

## 2016-02-28 NOTE — Addendum Note (Signed)
Addended by: Rafael Bihari A on: 02/28/2016 11:40 AM   Modules accepted: Orders

## 2016-02-28 NOTE — Telephone Encounter (Signed)
[name], daughter called and stated that [facility] never received order. Refaxed to Fax: [phone]

## 2016-02-29 DIAGNOSIS — Z96641 Presence of right artificial hip joint: Secondary | ICD-10-CM | POA: Diagnosis not present

## 2016-02-29 DIAGNOSIS — R488 Other symbolic dysfunctions: Secondary | ICD-10-CM | POA: Diagnosis not present

## 2016-02-29 DIAGNOSIS — M6281 Muscle weakness (generalized): Secondary | ICD-10-CM | POA: Diagnosis not present

## 2016-02-29 DIAGNOSIS — Z9181 History of falling: Secondary | ICD-10-CM | POA: Diagnosis not present

## 2016-03-01 DIAGNOSIS — Z96641 Presence of right artificial hip joint: Secondary | ICD-10-CM | POA: Diagnosis not present

## 2016-03-01 DIAGNOSIS — R488 Other symbolic dysfunctions: Secondary | ICD-10-CM | POA: Diagnosis not present

## 2016-03-01 DIAGNOSIS — Z9181 History of falling: Secondary | ICD-10-CM | POA: Diagnosis not present

## 2016-03-01 DIAGNOSIS — M6281 Muscle weakness (generalized): Secondary | ICD-10-CM | POA: Diagnosis not present

## 2016-03-04 DIAGNOSIS — Z96641 Presence of right artificial hip joint: Secondary | ICD-10-CM | POA: Diagnosis not present

## 2016-03-04 DIAGNOSIS — Z9181 History of falling: Secondary | ICD-10-CM | POA: Diagnosis not present

## 2016-03-04 DIAGNOSIS — M6281 Muscle weakness (generalized): Secondary | ICD-10-CM | POA: Diagnosis not present

## 2016-03-04 DIAGNOSIS — R488 Other symbolic dysfunctions: Secondary | ICD-10-CM | POA: Diagnosis not present

## 2016-03-05 DIAGNOSIS — Z9181 History of falling: Secondary | ICD-10-CM | POA: Diagnosis not present

## 2016-03-05 DIAGNOSIS — M6281 Muscle weakness (generalized): Secondary | ICD-10-CM | POA: Diagnosis not present

## 2016-03-05 DIAGNOSIS — R488 Other symbolic dysfunctions: Secondary | ICD-10-CM | POA: Diagnosis not present

## 2016-03-05 DIAGNOSIS — Z96641 Presence of right artificial hip joint: Secondary | ICD-10-CM | POA: Diagnosis not present

## 2016-03-06 DIAGNOSIS — R488 Other symbolic dysfunctions: Secondary | ICD-10-CM | POA: Diagnosis not present

## 2016-03-06 DIAGNOSIS — Z9181 History of falling: Secondary | ICD-10-CM | POA: Diagnosis not present

## 2016-03-06 DIAGNOSIS — Z96641 Presence of right artificial hip joint: Secondary | ICD-10-CM | POA: Diagnosis not present

## 2016-03-06 DIAGNOSIS — M6281 Muscle weakness (generalized): Secondary | ICD-10-CM | POA: Diagnosis not present

## 2016-03-07 DIAGNOSIS — Z96641 Presence of right artificial hip joint: Secondary | ICD-10-CM | POA: Diagnosis not present

## 2016-03-07 DIAGNOSIS — Z9181 History of falling: Secondary | ICD-10-CM | POA: Diagnosis not present

## 2016-03-07 DIAGNOSIS — M6281 Muscle weakness (generalized): Secondary | ICD-10-CM | POA: Diagnosis not present

## 2016-03-07 DIAGNOSIS — R488 Other symbolic dysfunctions: Secondary | ICD-10-CM | POA: Diagnosis not present

## 2016-03-08 DIAGNOSIS — M6281 Muscle weakness (generalized): Secondary | ICD-10-CM | POA: Diagnosis not present

## 2016-03-08 DIAGNOSIS — R488 Other symbolic dysfunctions: Secondary | ICD-10-CM | POA: Diagnosis not present

## 2016-03-08 DIAGNOSIS — Z9181 History of falling: Secondary | ICD-10-CM | POA: Diagnosis not present

## 2016-03-08 DIAGNOSIS — Z96641 Presence of right artificial hip joint: Secondary | ICD-10-CM | POA: Diagnosis not present

## 2016-03-11 DIAGNOSIS — M6281 Muscle weakness (generalized): Secondary | ICD-10-CM | POA: Diagnosis not present

## 2016-03-11 DIAGNOSIS — R488 Other symbolic dysfunctions: Secondary | ICD-10-CM | POA: Diagnosis not present

## 2016-03-11 DIAGNOSIS — Z96641 Presence of right artificial hip joint: Secondary | ICD-10-CM | POA: Diagnosis not present

## 2016-03-11 DIAGNOSIS — Z9181 History of falling: Secondary | ICD-10-CM | POA: Diagnosis not present

## 2016-03-12 ENCOUNTER — Encounter: Payer: Self-pay | Admitting: Internal Medicine

## 2016-03-12 ENCOUNTER — Ambulatory Visit (INDEPENDENT_AMBULATORY_CARE_PROVIDER_SITE_OTHER): Payer: Medicare Other | Admitting: Internal Medicine

## 2016-03-12 VITALS — BP 122/64 | HR 78 | Temp 97.7°F

## 2016-03-12 DIAGNOSIS — Z9181 History of falling: Secondary | ICD-10-CM

## 2016-03-12 DIAGNOSIS — R400 Somnolence: Secondary | ICD-10-CM

## 2016-03-12 DIAGNOSIS — M6281 Muscle weakness (generalized): Secondary | ICD-10-CM | POA: Diagnosis not present

## 2016-03-12 DIAGNOSIS — D649 Anemia, unspecified: Secondary | ICD-10-CM

## 2016-03-12 DIAGNOSIS — R269 Unspecified abnormalities of gait and mobility: Secondary | ICD-10-CM | POA: Diagnosis not present

## 2016-03-12 DIAGNOSIS — S22080A Wedge compression fracture of T11-T12 vertebra, initial encounter for closed fracture: Secondary | ICD-10-CM | POA: Diagnosis not present

## 2016-03-12 DIAGNOSIS — R488 Other symbolic dysfunctions: Secondary | ICD-10-CM | POA: Diagnosis not present

## 2016-03-12 DIAGNOSIS — Z96641 Presence of right artificial hip joint: Secondary | ICD-10-CM | POA: Diagnosis not present

## 2016-03-12 DIAGNOSIS — G4700 Insomnia, unspecified: Secondary | ICD-10-CM

## 2016-03-12 DIAGNOSIS — S72001A Fracture of unspecified part of neck of right femur, initial encounter for closed fracture: Secondary | ICD-10-CM | POA: Diagnosis not present

## 2016-03-12 DIAGNOSIS — R251 Tremor, unspecified: Secondary | ICD-10-CM

## 2016-03-12 DIAGNOSIS — R413 Other amnesia: Secondary | ICD-10-CM | POA: Diagnosis not present

## 2016-03-12 LAB — CBC WITH DIFFERENTIAL/PLATELET
Basophils Absolute: 0 cells/uL (ref 0–200)
Basophils Relative: 0 %
EOS ABS: 157 {cells}/uL (ref 15–500)
EOS PCT: 1 %
HCT: 31 % — ABNORMAL LOW (ref 35.0–45.0)
Hemoglobin: 9.9 g/dL — ABNORMAL LOW (ref 11.7–15.5)
LYMPHS ABS: 7222 {cells}/uL — AB (ref 850–3900)
Lymphocytes Relative: 46 %
MCH: 29.5 pg (ref 27.0–33.0)
MCHC: 31.9 g/dL — AB (ref 32.0–36.0)
MCV: 92.3 fL (ref 80.0–100.0)
MPV: 9.3 fL (ref 7.5–12.5)
Monocytes Absolute: 1256 cells/uL — ABNORMAL HIGH (ref 200–950)
Monocytes Relative: 8 %
NEUTROS PCT: 45 %
Neutro Abs: 7065 cells/uL (ref 1500–7800)
Platelets: 333 10*3/uL (ref 140–400)
RBC: 3.36 MIL/uL — ABNORMAL LOW (ref 3.80–5.10)
RDW: 13.6 % (ref 11.0–15.0)
WBC: 15.7 10*3/uL — ABNORMAL HIGH (ref 3.8–10.8)

## 2016-03-12 LAB — RETICULOCYTES
ABS RETIC: 47040 {cells}/uL (ref 20000–80000)
RBC.: 3.36 MIL/uL — AB (ref 3.80–5.10)
RETIC CT PCT: 1.4 %

## 2016-03-12 LAB — COMPREHENSIVE METABOLIC PANEL
ALBUMIN: 3.3 g/dL — AB (ref 3.6–5.1)
ALT: 10 U/L (ref 6–29)
AST: 13 U/L (ref 10–35)
Alkaline Phosphatase: 79 U/L (ref 33–130)
BUN: 31 mg/dL — ABNORMAL HIGH (ref 7–25)
CALCIUM: 9.8 mg/dL (ref 8.6–10.4)
CHLORIDE: 105 mmol/L (ref 98–110)
CO2: 26 mmol/L (ref 20–31)
Creat: 0.92 mg/dL — ABNORMAL HIGH (ref 0.60–0.88)
Glucose, Bld: 93 mg/dL (ref 65–99)
POTASSIUM: 4.3 mmol/L (ref 3.5–5.3)
Sodium: 140 mmol/L (ref 135–146)
TOTAL PROTEIN: 6 g/dL — AB (ref 6.1–8.1)
Total Bilirubin: 0.2 mg/dL (ref 0.2–1.2)

## 2016-03-12 MED ORDER — ACETAMINOPHEN 500 MG PO TABS: ORAL_TABLET | ORAL | 0 refills | Status: AC

## 2016-03-12 MED ORDER — LORAZEPAM 1 MG PO TABS: ORAL_TABLET | ORAL | 1 refills | Status: DC

## 2016-03-12 NOTE — Progress Notes (Signed)
Facility  [facility]    Place of Service:   OFFICE    No Known Allergies  Chief Complaint  Patient presents with  . Medical Management of Chronic Issues    6 month medication management of memory, tremor. On 02/17/16 ED for back pain. On 01/28/16 to 02/01/16 in hospital for right hip fracture after fall. With daughter Debra [name]    HPI:  Last seen by me on 09/12/2015. Hospitalized 01/28/16 to 02/01/16 for fx right hip following a fall. Had right hemiarthroplasty by Dr. Wynelle [name]. Went to [facility] for rehab. Returned to [facility]. Debra [name] fell again and was seen in the ER on 02/17/16 for pain in the low back and head contusion. Debra [name] had T12 compression fx.(? Old or new).  Debra [name] stays at [facility].  Debra [name] is quite drowsy on presentation today. Quickly falls asleep if not stimulated. Haw been using mirtazapine,  But it does not make her sleep.  Staff at [facility] would like to try a hospital bed due to difficulty of moving her.  Normocytic, normochromic Anemia with hgb 9.0 on 01/31/16.    Medications: Patient's Medications  New Prescriptions   No medications on file  Previous Medications   ACETAMINOPHEN (TYLENOL) 500 MG TABLET    Take 2 tablets (1,000 mg total) by mouth 3 (three) times daily after meals.   MIRTAZAPINE (REMERON) 15 MG TABLET    Take one tablet by mouth nightly to help sleep  Modified Medications   No medications on file  Discontinued Medications   ASPIRIN 81 MG TABLET    Take by mouth daily.   HYDROCODONE-ACETAMINOPHEN (NORCO) 5-325 MG TABLET    One every 6 hours if needed for pain    Review of Systems  Constitutional: Negative for activity change, diaphoresis, fatigue, fever and unexpected weight change.  HENT: Positive for hearing loss. Negative for ear pain, sore throat, trouble swallowing and voice change.   Eyes: Negative.   Respiratory: Negative for cough, choking, shortness of breath and wheezing.   Cardiovascular: Negative for chest pain, palpitations and leg  swelling.  Gastrointestinal: Negative for abdominal distention, abdominal pain, diarrhea and nausea.  Endocrine: Negative.   Genitourinary: Negative for dysuria, flank pain, frequency, urgency and vaginal pain.  Musculoskeletal: Positive for gait problem. Negative for arthralgias, back pain, joint swelling, myalgias and neck pain.  Skin:       Scar at the right flank at site where Debra [name] had a black lesion  In 2015.  Allergic/Immunologic: Negative.   Neurological: Positive for tremors and numbness (at toes). Negative for dizziness, seizures and weakness.  Hematological: Negative.   Psychiatric/Behavioral: Negative.     Vitals:   03/12/16 1346  BP: 122/64  Pulse: 78  Temp: 97.7 F (36.5 C)  TempSrc: Oral  SpO2: 97%   There is no height or weight on file to calculate BMI. Wt Readings from Last 3 Encounters:  02/17/16 122 lb (55.3 kg)  01/29/16 122 lb 9.2 oz (55.6 kg)  01/11/16 125 lb 6.4 oz (56.9 kg)      Physical Exam  Constitutional: Debra [name] appears well-developed and well-nourished. No distress.  HENT:  Head: Normocephalic and atraumatic.  Right Ear: External ear normal.  Left Ear: External ear normal.  Nose: Nose normal.  Mouth/Throat: Oropharynx is clear and moist. No oropharyngeal exudate.   Decreased hearing.  Eyes: Conjunctivae and EOM are normal. Pupils are equal, round, and reactive to light. Right eye exhibits no discharge. Left eye exhibits no discharge. No scleral icterus.  Neck: Normal range of motion. Neck supple. No JVD present. No tracheal deviation present. No thyromegaly present.  Cardiovascular: Normal rate, regular rhythm, normal heart sounds and intact distal pulses.  Exam reveals no gallop.   No murmur heard. Pulmonary/Chest: Effort normal and breath sounds normal. No stridor. No respiratory distress. Debra [name] has no wheezes. Debra [name] has no rales.  Abdominal: Soft. Bowel sounds are normal. Debra [name] exhibits no distension and no mass. There is no tenderness. There is no  rebound and no guarding.  Genitourinary:  Genitourinary Comments: S/p hysterectomy.   Musculoskeletal: Normal range of motion. Debra [name] exhibits no edema or tenderness.  Unstable gait. Using cane and 4 wheel walker.  Lymphadenopathy:    Debra [name] has no cervical adenopathy.  Neurological: Debra [name] is alert. Debra [name] has normal reflexes. No cranial nerve deficit. Coordination normal.  Able to feel vibration at ankles, but not on toes. Bilateral tremor most consistent with BET. Significant memory deficit.  Skin: No rash noted. Debra [name] is not diaphoretic. No erythema. No pallor.  Psychiatric: Debra [name] has a normal mood and affect. Her behavior is normal. Judgment and thought content normal. Her mood appears not anxious. Cognition and memory are impaired. Debra [name] does not exhibit a depressed mood. Debra [name] exhibits abnormal recent memory.    Labs reviewed: Lab Summary Latest Ref Rng & Units 02/01/2016 01/31/2016 01/30/2016 01/29/2016 01/29/2016  Hemoglobin 12.0 - 15.0 g/dL 9.2(L) 9.0(L) 9.5(L) 11.3(L) 11.6(L)  Hematocrit 36.0 - 46.0 % 27.8(L) 27.1(L) 29.1(L) 34.7(L) 34.0(L)  White count 4.0 - 10.5 K/uL 12.8(H) 13.2(H) 12.3(H) 14.7(H) (None)  Platelet count 150 - 400 K/uL 212 187 199 226 (None)  Sodium 135 - 145 mmol/L (None) 137 137 136 136  Potassium 3.5 - 5.1 mmol/L (None) 3.4(L) 3.7 3.8 4.0  Calcium 8.9 - 10.3 mg/dL (None) 8.8(L) 8.7(L) 9.6 (None)  Phosphorus - (None) (None) (None) (None) (None)  Creatinine 0.44 - 1.00 mg/dL (None) 0.79 0.78 0.85 0.80  AST - (None) (None) (None) (None) (None)  Alk Phos - (None) (None) (None) (None) (None)  Bilirubin - (None) (None) (None) (None) (None)  Glucose 65 - 99 mg/dL (None) 104(H) 129(H) 117(H) 174(H)  Cholesterol - (None) (None) (None) (None) (None)  HDL cholesterol - (None) (None) (None) (None) (None)  Triglycerides - (None) (None) (None) (None) (None)  LDL Direct - (None) (None) (None) (None) (None)  LDL Calc - (None) (None) (None) (None) (None)  Total protein - (None) (None)  (None) (None) (None)  Albumin 3.5 - 5.0 g/dL (None) (None) (None) 3.6 (None)  Some recent data might be hidden   Lab Results  Component Value Date   TSH 1.336 01/29/2016   TSH 2.91 09/23/2013   Lab Results  Component Value Date   BUN 17 01/31/2016   BUN 21 (H) 01/30/2016   BUN 26 (H) 01/29/2016   No results found for: HGBA1C  Assessment/Plan  1. Drowsy Stop mirtzipine - Comprehensive metabolic panel - TSH  2. History of fall At risk for falls in the future  3. Memory deficit Worse since her surgery - Comprehensive metabolic panel  4. Abnormality of gait Continue to work with PT  5. Tremor coarser  6. Closed fracture of right hip, initial encounter ([place]) - acetaminophen (TYLENOL) 500 MG tablet; One every 4 hours if needed for pain  Dispense: 30 tablet; Refill: 0  7. T12 compression fracture (HCC) - acetaminophen (TYLENOL) 500 MG tablet; One every 4 hours if needed for pain  Dispense: 30 tablet; Refill: 0  8. Anemia, unspecified type - CBC with  Differential/Platelet - Reticulocytes  9. Insomnia, unspecified type - LORazepam (ATIVAN) 1 MG tablet; One at bed if needed for sleep  Dispense: 30 tablet; Refill: 1

## 2016-03-13 DIAGNOSIS — Z9181 History of falling: Secondary | ICD-10-CM | POA: Diagnosis not present

## 2016-03-13 DIAGNOSIS — M6281 Muscle weakness (generalized): Secondary | ICD-10-CM | POA: Diagnosis not present

## 2016-03-13 DIAGNOSIS — Z96641 Presence of right artificial hip joint: Secondary | ICD-10-CM | POA: Diagnosis not present

## 2016-03-13 DIAGNOSIS — R488 Other symbolic dysfunctions: Secondary | ICD-10-CM | POA: Diagnosis not present

## 2016-03-13 LAB — TSH: TSH: 2.23 m[IU]/L

## 2016-03-14 DIAGNOSIS — M6281 Muscle weakness (generalized): Secondary | ICD-10-CM | POA: Diagnosis not present

## 2016-03-14 DIAGNOSIS — R488 Other symbolic dysfunctions: Secondary | ICD-10-CM | POA: Diagnosis not present

## 2016-03-14 DIAGNOSIS — Z96641 Presence of right artificial hip joint: Secondary | ICD-10-CM | POA: Diagnosis not present

## 2016-03-14 DIAGNOSIS — Z9181 History of falling: Secondary | ICD-10-CM | POA: Diagnosis not present

## 2016-03-15 ENCOUNTER — Encounter (HOSPITAL_COMMUNITY): Payer: Self-pay | Admitting: *Deleted

## 2016-03-15 ENCOUNTER — Telehealth: Payer: Self-pay

## 2016-03-15 ENCOUNTER — Emergency Department (HOSPITAL_COMMUNITY)
Admission: EM | Admit: 2016-03-15 | Discharge: 2016-03-16 | Disposition: A | Discharge status: Home / Self Care | Payer: Medicare Other | Attending: Emergency Medicine | Admitting: Emergency Medicine

## 2016-03-15 ENCOUNTER — Emergency Department (HOSPITAL_COMMUNITY): Payer: Medicare Other

## 2016-03-15 DIAGNOSIS — R079 Chest pain, unspecified: Secondary | ICD-10-CM | POA: Diagnosis not present

## 2016-03-15 DIAGNOSIS — W1839XA Other fall on same level, initial encounter: Secondary | ICD-10-CM | POA: Diagnosis not present

## 2016-03-15 DIAGNOSIS — Z9181 History of falling: Secondary | ICD-10-CM | POA: Diagnosis not present

## 2016-03-15 DIAGNOSIS — W19XXXA Unspecified fall, initial encounter: Secondary | ICD-10-CM

## 2016-03-15 DIAGNOSIS — S3992XA Unspecified injury of lower back, initial encounter: Secondary | ICD-10-CM | POA: Diagnosis not present

## 2016-03-15 DIAGNOSIS — M6281 Muscle weakness (generalized): Secondary | ICD-10-CM | POA: Diagnosis not present

## 2016-03-15 DIAGNOSIS — S22088A Other fracture of T11-T12 vertebra, initial encounter for closed fracture: Secondary | ICD-10-CM | POA: Diagnosis not present

## 2016-03-15 DIAGNOSIS — M546 Pain in thoracic spine: Secondary | ICD-10-CM | POA: Diagnosis not present

## 2016-03-15 DIAGNOSIS — H9209 Otalgia, unspecified ear: Secondary | ICD-10-CM | POA: Diagnosis not present

## 2016-03-15 DIAGNOSIS — F0390 Unspecified dementia without behavioral disturbance: Secondary | ICD-10-CM | POA: Diagnosis not present

## 2016-03-15 DIAGNOSIS — Z79899 Other long term (current) drug therapy: Secondary | ICD-10-CM | POA: Insufficient documentation

## 2016-03-15 DIAGNOSIS — Y939 Activity, unspecified: Secondary | ICD-10-CM | POA: Insufficient documentation

## 2016-03-15 DIAGNOSIS — S0990XA Unspecified injury of head, initial encounter: Secondary | ICD-10-CM | POA: Diagnosis not present

## 2016-03-15 DIAGNOSIS — S22000A Wedge compression fracture of unspecified thoracic vertebra, initial encounter for closed fracture: Secondary | ICD-10-CM

## 2016-03-15 DIAGNOSIS — S22080A Wedge compression fracture of T11-T12 vertebra, initial encounter for closed fracture: Secondary | ICD-10-CM | POA: Diagnosis not present

## 2016-03-15 DIAGNOSIS — Y999 Unspecified external cause status: Secondary | ICD-10-CM | POA: Diagnosis not present

## 2016-03-15 DIAGNOSIS — S299XXA Unspecified injury of thorax, initial encounter: Secondary | ICD-10-CM | POA: Diagnosis not present

## 2016-03-15 DIAGNOSIS — R488 Other symbolic dysfunctions: Secondary | ICD-10-CM | POA: Diagnosis not present

## 2016-03-15 DIAGNOSIS — S199XXA Unspecified injury of neck, initial encounter: Secondary | ICD-10-CM | POA: Diagnosis not present

## 2016-03-15 DIAGNOSIS — Z96641 Presence of right artificial hip joint: Secondary | ICD-10-CM | POA: Insufficient documentation

## 2016-03-15 DIAGNOSIS — Y929 Unspecified place or not applicable: Secondary | ICD-10-CM | POA: Insufficient documentation

## 2016-03-15 DIAGNOSIS — H9202 Otalgia, left ear: Secondary | ICD-10-CM | POA: Diagnosis not present

## 2016-03-15 DIAGNOSIS — S3993XA Unspecified injury of pelvis, initial encounter: Secondary | ICD-10-CM | POA: Diagnosis not present

## 2016-03-15 NOTE — ED Notes (Signed)
Pt dressed in gown, ready for CT and X-ray.

## 2016-03-15 NOTE — ED Triage Notes (Signed)
Per EMS, pt from [facility] fell while trying to get out of chair. Pt fell on left side. Pt complains of left ear pain. Pt fell earlier today as well but did not get evaluated.   Both falls were witnessed, pt did not lose consciousness and is not on blood thinners.

## 2016-03-15 NOTE — ED Provider Notes (Signed)
[facility] DEPT Provider Note   CSN: [id number] Arrival date & time: 03/15/16  1726     History   Chief Complaint Chief Complaint  Patient presents with  . Fall    HPI Debra [name] is a 83 y.o. female.  HPI  83 year old female with a history of falls presents with 2 falls today. Both were witnessed. Patient is a history of dementia so her daughter gives the history. Solids patient had a broken hip on the right side a few weeks back and had been admitted and went to rehabilitation and is now in memory care facility and is not supposed to be walking. Every time she tries to walk she falls. This is likely the reason for her fall today. She complains of lower back left-sided chest and left ear pain after a fall. She denied blood thinners. No other associated symptoms. No other modifying factors.  Past Medical History:  Diagnosis Date  . 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 Essential and other specified forms of tremor   . History of fall 09/12/2015  . Memory deficit 04/03/2013  . Nodular lesion on surface of skin 07/29/2013   Right flank. Black. About 15mm diameter. Nodular.   . Other and unspecified hyperlipidemia   . Senile dementia, uncomplicated   . T12 compression fracture ([place]) 02/21/2016  . Urinary frequency 04/03/2013   Nocturnal, 4-5x/night.       Patient Active Problem List   Diagnosis Date Noted  . Drowsy 03/12/2016  . Anemia 03/12/2016  . Insomnia 02/21/2016  . T12 compression fracture ([place]) 02/21/2016  . Leukocytosis 01/29/2016  . Dementia 01/29/2016  . Closed right hip fracture ([place]) 01/29/2016  . Abnormal ECG 01/29/2016  . Fracture of femoral neck, right ([place]) 01/29/2016  . History of fall 09/12/2015  . Hallucinations 02/08/2015  . Hearing deficit 02/10/2014  . Abnormality of gait 11/25/2013  . 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 Tremor 04/03/2013  . Urinary frequency 04/03/2013  . Memory deficit 04/03/2013  . 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 Dyslipidemia 04/03/2013    Past Surgical  History:  Procedure Laterality Date  . ABDOMINAL HYSTERECTOMY  1966  . CATARACT EXTRACTION Right 2013   Dr. Herbert Deaner  . excise skin cancer left upper arm Left 2013   Dr. Jari Pigg  . HIP ARTHROPLASTY Right 01/29/2016   Procedure: ARTHROPLASTY BIPOLAR HIP (HEMIARTHROPLASTY);  Surgeon: Gaynelle Arabian, MD;  Location: [facility] ORS;  Service: Orthopedics;  Laterality: Right;  . TONSILLECTOMY      OB History    No data available       Home Medications    Prior to Admission medications   Medication Sig Start Date End Date Taking? Authorizing Provider  acetaminophen (TYLENOL) 500 MG tablet One every 4 hours if needed for pain Patient taking differently: Take 1,000 mg by mouth 3 (three) times daily as needed (pain).  03/12/16  Yes Estill Dooms, MD  LORazepam (ATIVAN) 1 MG tablet One at bed if needed for sleep Patient taking differently: Take 1 mg by mouth at bedtime as needed for anxiety. One at bed if needed for sleep 03/12/16  Yes Estill Dooms, MD    Family History Family History  Problem Relation Age of Onset  . CAD Mother   . Diabetes Neg Hx   . Stroke Neg Hx   . Cancer Neg Hx   . Hypertension Neg Hx     Social History Social History  Substance Use Topics  . Smoking status: Never Smoker  . Smokeless tobacco: Never Used  .  Alcohol use No     Allergies   Patient has no known allergies.   Review of Systems Review of Systems  Unable to perform ROS: Dementia     Physical Exam Updated Vital Signs BP (!) 142/105 (BP Location: Left Arm)   Pulse 86   Temp 98.2 F (36.8 C) (Oral)   Resp 18   SpO2 99%   Physical Exam  Constitutional: She appears well-developed and well-nourished.  HENT:  Head: Normocephalic and atraumatic.  Right Ear: Tympanic membrane and ear canal normal. No swelling.  Left Ear: Tympanic membrane and ear canal normal. No swelling.  Eyes: Conjunctivae and EOM are normal.  Neck: Normal range of motion.  Cardiovascular: Normal rate and regular rhythm.    Pulmonary/Chest: No stridor. No respiratory distress.  Abdominal: Soft. She exhibits no distension.  Musculoskeletal: Normal range of motion. She exhibits no edema.  No cervical spine tenderness, but does have lower thoracic/upper lumbar ttp. No tenderness or pain with palpation and full ROM of all joints in upper and lower extremities.  No ecchymosis or other signs of trauma on back or extremities.  No Pain with AP or lateral compression of ribs.  No Paracervical ttp, paraspinal ttp   Neurological: She is alert.  Skin: Skin is warm and dry.  Nursing note and vitals reviewed.    ED Treatments / Results  Labs (all labs ordered are listed, but only abnormal results are displayed) Labs Reviewed - No data to display  EKG  EKG Interpretation None       Radiology No results found.  Procedures Procedures (including critical care time)  Medications Ordered in ED Medications - No data to display   Initial Impression / Assessment and Plan / ED Course  I have reviewed the triage vital signs and the nursing notes.  Pertinent labs & imaging results that were available during my care of the patient were reviewed by me and considered in my medical decision making (see chart for details).     Imaging negative. Still stable. No pain. Doubt fracture. Patient likely fell because of her right hip. Doubt LOC or other more significant causes like arrhythmias, syncope or significant medical issues. Compression fracture slightly worse but only hurts when pushing on it. otherwise is asymptomatic from it so will continue outpatient management as before.   Final Clinical Impressions(s) / ED Diagnoses   Final diagnoses:  Fall, initial encounter  Compression fracture of body of thoracic vertebra [facility])      Merrily Pew, MD 03/16/16 [phone]

## 2016-03-15 NOTE — Telephone Encounter (Signed)
Incoming fax received from [facility] requesting orders to be signed for Semi-electric bed for patient. Bed will be supplied through [facility].  I called [facility] to inform them fax received and Dr.Green will return in office on Tuesday. Spoke with Levada Dy and she stated that bed is to be delivered on Tuesday and if possible can we have another doctor sign off on order.  Order give to Kemp (in-office provider) to sign and fax back [phone] [facility])

## 2016-03-15 NOTE — ED Notes (Signed)
Patient comfortable and resting, family has gone home. Waiting on [facility]

## 2016-03-15 NOTE — ED Notes (Signed)
[facility] called for transportation  

## 2016-03-15 NOTE — ED Notes (Signed)
Bed: HQ30 Expected date:  Expected time:  Means of arrival:  Comments: 83 yo female fall

## 2016-03-16 DIAGNOSIS — W19XXXA Unspecified fall, initial encounter: Secondary | ICD-10-CM | POA: Diagnosis not present

## 2016-03-16 DIAGNOSIS — M549 Dorsalgia, unspecified: Secondary | ICD-10-CM | POA: Diagnosis not present

## 2016-03-19 DIAGNOSIS — Z9181 History of falling: Secondary | ICD-10-CM | POA: Diagnosis not present

## 2016-03-19 DIAGNOSIS — M6281 Muscle weakness (generalized): Secondary | ICD-10-CM | POA: Diagnosis not present

## 2016-03-19 DIAGNOSIS — R488 Other symbolic dysfunctions: Secondary | ICD-10-CM | POA: Diagnosis not present

## 2016-03-19 DIAGNOSIS — Z96641 Presence of right artificial hip joint: Secondary | ICD-10-CM | POA: Diagnosis not present

## 2016-03-20 ENCOUNTER — Other Ambulatory Visit: Payer: Self-pay | Admitting: Internal Medicine

## 2016-03-20 ENCOUNTER — Telehealth: Payer: Self-pay

## 2016-03-20 DIAGNOSIS — R488 Other symbolic dysfunctions: Secondary | ICD-10-CM | POA: Diagnosis not present

## 2016-03-20 DIAGNOSIS — Z9181 History of falling: Secondary | ICD-10-CM | POA: Diagnosis not present

## 2016-03-20 DIAGNOSIS — M6281 Muscle weakness (generalized): Secondary | ICD-10-CM | POA: Diagnosis not present

## 2016-03-20 DIAGNOSIS — G4700 Insomnia, unspecified: Secondary | ICD-10-CM

## 2016-03-20 DIAGNOSIS — Z96641 Presence of right artificial hip joint: Secondary | ICD-10-CM | POA: Diagnosis not present

## 2016-03-20 DIAGNOSIS — F419 Anxiety disorder, unspecified: Secondary | ICD-10-CM | POA: Insufficient documentation

## 2016-03-20 MED ORDER — MELATONIN 5 MG/ML PO LIQD: 1.0000 mL | Freq: Every day | ORAL | 5 refills | Status: AC

## 2016-03-20 MED ORDER — ESCITALOPRAM OXALATE 10 MG PO TABS: ORAL_TABLET | ORAL | 5 refills | Status: AC

## 2016-03-20 NOTE — Telephone Encounter (Signed)
Left detailed message on Debra [name]'s voicemail informing her rx orders faxed to [facility]

## 2016-03-20 NOTE — Telephone Encounter (Signed)
1.) Patient's daughter called requesting medication for sleep. Patient is getting about 2 hours of sleep a day. Medications recommended by the facility was   Liquid melatonin or Trazodone  2.) Patient's daughter would also like to know if lexapro would be an option to help keep her mother calm and less anxious throughout the day   IF any request approved rx will need to be printed and faxed to [facility]  Please advise

## 2016-03-20 NOTE — Telephone Encounter (Signed)
Orders printed

## 2016-03-21 DIAGNOSIS — R488 Other symbolic dysfunctions: Secondary | ICD-10-CM | POA: Diagnosis not present

## 2016-03-21 DIAGNOSIS — Z96641 Presence of right artificial hip joint: Secondary | ICD-10-CM | POA: Diagnosis not present

## 2016-03-21 DIAGNOSIS — M6281 Muscle weakness (generalized): Secondary | ICD-10-CM | POA: Diagnosis not present

## 2016-03-21 DIAGNOSIS — Z9181 History of falling: Secondary | ICD-10-CM | POA: Diagnosis not present

## 2016-03-22 DIAGNOSIS — Z9181 History of falling: Secondary | ICD-10-CM | POA: Diagnosis not present

## 2016-03-22 DIAGNOSIS — R488 Other symbolic dysfunctions: Secondary | ICD-10-CM | POA: Diagnosis not present

## 2016-03-22 DIAGNOSIS — M6281 Muscle weakness (generalized): Secondary | ICD-10-CM | POA: Diagnosis not present

## 2016-03-22 DIAGNOSIS — Z96641 Presence of right artificial hip joint: Secondary | ICD-10-CM | POA: Diagnosis not present

## 2016-03-25 DIAGNOSIS — R488 Other symbolic dysfunctions: Secondary | ICD-10-CM | POA: Diagnosis not present

## 2016-03-25 DIAGNOSIS — M6281 Muscle weakness (generalized): Secondary | ICD-10-CM | POA: Diagnosis not present

## 2016-03-25 DIAGNOSIS — Z96641 Presence of right artificial hip joint: Secondary | ICD-10-CM | POA: Diagnosis not present

## 2016-03-25 DIAGNOSIS — Z9181 History of falling: Secondary | ICD-10-CM | POA: Diagnosis not present

## 2016-03-26 DIAGNOSIS — M6281 Muscle weakness (generalized): Secondary | ICD-10-CM | POA: Diagnosis not present

## 2016-03-26 DIAGNOSIS — R488 Other symbolic dysfunctions: Secondary | ICD-10-CM | POA: Diagnosis not present

## 2016-03-26 DIAGNOSIS — Z96641 Presence of right artificial hip joint: Secondary | ICD-10-CM | POA: Diagnosis not present

## 2016-03-26 DIAGNOSIS — Z9181 History of falling: Secondary | ICD-10-CM | POA: Diagnosis not present

## 2016-03-27 ENCOUNTER — Encounter: Payer: Self-pay | Admitting: Internal Medicine

## 2016-03-27 ENCOUNTER — Ambulatory Visit (INDEPENDENT_AMBULATORY_CARE_PROVIDER_SITE_OTHER): Payer: Medicare Other | Admitting: Internal Medicine

## 2016-03-27 VITALS — BP 132/62 | HR 62 | Temp 98.2°F | Ht 63.0 in | Wt 122.0 lb

## 2016-03-27 DIAGNOSIS — R488 Other symbolic dysfunctions: Secondary | ICD-10-CM | POA: Diagnosis not present

## 2016-03-27 DIAGNOSIS — Z9181 History of falling: Secondary | ICD-10-CM | POA: Diagnosis not present

## 2016-03-27 DIAGNOSIS — R251 Tremor, unspecified: Secondary | ICD-10-CM | POA: Diagnosis not present

## 2016-03-27 DIAGNOSIS — F0280 Dementia in other diseases classified elsewhere without behavioral disturbance: Secondary | ICD-10-CM

## 2016-03-27 DIAGNOSIS — D72820 Lymphocytosis (symptomatic): Secondary | ICD-10-CM | POA: Diagnosis not present

## 2016-03-27 DIAGNOSIS — G20A1 Parkinson's disease without dyskinesia, without mention of fluctuations: Secondary | ICD-10-CM

## 2016-03-27 DIAGNOSIS — N182 Chronic kidney disease, stage 2 (mild): Secondary | ICD-10-CM | POA: Insufficient documentation

## 2016-03-27 DIAGNOSIS — R443 Hallucinations, unspecified: Secondary | ICD-10-CM | POA: Diagnosis not present

## 2016-03-27 DIAGNOSIS — R269 Unspecified abnormalities of gait and mobility: Secondary | ICD-10-CM | POA: Diagnosis not present

## 2016-03-27 DIAGNOSIS — G20 Parkinson's disease: Secondary | ICD-10-CM

## 2016-03-27 DIAGNOSIS — G4700 Insomnia, unspecified: Secondary | ICD-10-CM

## 2016-03-27 DIAGNOSIS — D649 Anemia, unspecified: Secondary | ICD-10-CM | POA: Diagnosis not present

## 2016-03-27 DIAGNOSIS — Z96641 Presence of right artificial hip joint: Secondary | ICD-10-CM | POA: Diagnosis not present

## 2016-03-27 DIAGNOSIS — M6281 Muscle weakness (generalized): Secondary | ICD-10-CM | POA: Diagnosis not present

## 2016-03-27 NOTE — Patient Instructions (Signed)
Let me know if she continues to have insomnia after trying the Melatonin through next Monday

## 2016-03-27 NOTE — Progress Notes (Signed)
Facility  [facility]    Place of Service:   OFFICE    No Known Allergies  Chief Complaint  Patient presents with  . Medical Management of Chronic Issues    abnoraml labs -here with daughter [name]    HPI:  Insomnia, unspecified type - Able to fall asleep with melatonin, but wakes about 2AM and is unable to sleep.   Dementia due to Parkinson's disease without behavioral disturbance ([place]) - unchanged  Abnormality of gait - no longer walking  Hallucinations - none  Tremor - unchanged  Lymphocytosis - increasing lymphocyte count as well as elevated WBC Suspicious for CLL.     Medications: Patient's Medications  New Prescriptions   No medications on file  Previous Medications   ACETAMINOPHEN (TYLENOL) 500 MG TABLET    One every 4 hours if needed for pain   ESCITALOPRAM (LEXAPRO) 10 MG TABLET    1 daily to help anxiety and depression   MELATONIN 5 MG/ML LIQD    Take 1 mL by mouth at bedtime.  Modified Medications   No medications on file  Discontinued Medications   LORAZEPAM (ATIVAN) 1 MG TABLET    One at bed if needed for sleep    Review of Systems  Constitutional: Negative for activity change, diaphoresis, fatigue, fever and unexpected weight change.  HENT: Positive for hearing loss. Negative for ear pain, sore throat, trouble swallowing and voice change.   Eyes: Negative.   Respiratory: Negative for cough, choking, shortness of breath and wheezing.   Cardiovascular: Negative for chest pain, palpitations and leg swelling.  Gastrointestinal: Negative for abdominal distention, abdominal pain, diarrhea and nausea.  Endocrine: Negative.   Genitourinary: Negative for dysuria, flank pain, frequency, urgency and vaginal pain.  Musculoskeletal: Positive for gait problem. Negative for arthralgias, back pain, joint swelling, myalgias and neck pain.  Skin:       Scar at the right flank at site where she had a black lesion  In 2015.  Allergic/Immunologic: Negative.     Neurological: Positive for tremors and numbness (at toes). Negative for dizziness, seizures and weakness.  Hematological:       Chronic anemia and lymphocytosis.  Psychiatric/Behavioral: Negative.     Vitals:   03/27/16 1217  BP: 132/62  Pulse: 62  Temp: 98.2 F (36.8 C)  TempSrc: Oral  SpO2: 98%  Weight: 122 lb (55.3 kg)  Height: 5' 3"  (1.6 m)   Body mass index is 21.61 kg/m. Wt Readings from Last 3 Encounters:  03/27/16 122 lb (55.3 kg)  02/17/16 122 lb (55.3 kg)  01/29/16 122 lb 9.2 oz (55.6 kg)      Physical Exam  Constitutional: She appears well-developed and well-nourished. No distress.  HENT:  Head: Normocephalic and atraumatic.  Right Ear: External ear normal.  Left Ear: External ear normal.  Nose: Nose normal.  Mouth/Throat: Oropharynx is clear and moist. No oropharyngeal exudate.   Decreased hearing.  Eyes: Conjunctivae and EOM are normal. Pupils are equal, round, and reactive to light. Right eye exhibits no discharge. Left eye exhibits no discharge. No scleral icterus.  Neck: Normal range of motion. Neck supple. No JVD present. No tracheal deviation present. No thyromegaly present.  Cardiovascular: Normal rate, regular rhythm, normal heart sounds and intact distal pulses.  Exam reveals no gallop.   No murmur heard. Pulmonary/Chest: Effort normal and breath sounds normal. No stridor. No respiratory distress. She has no wheezes. She has no rales.  Abdominal: Soft. Bowel sounds are normal. She exhibits  no distension and no mass. There is no tenderness. There is no rebound and no guarding.  Genitourinary:  Genitourinary Comments: S/p hysterectomy.   Musculoskeletal: Normal range of motion. She exhibits no edema or tenderness.  Unstable gait. Using cane and 4 wheel walker.  Lymphadenopathy:    She has no cervical adenopathy.  Neurological: She is alert. She has normal reflexes. No cranial nerve deficit. Coordination normal.  Able to feel vibration at ankles,  but not on toes. Bilateral tremor most consistent with BET. Significant memory deficit.  Skin: No rash noted. She is not diaphoretic. No erythema. No pallor.  Psychiatric: She has a normal mood and affect. Her behavior is normal. Judgment and thought content normal. Her mood appears not anxious. Cognition and memory are impaired. She does not exhibit a depressed mood. She exhibits abnormal recent memory.    Labs reviewed: Lab Summary Latest Ref Rng & Units 03/12/2016 02/01/2016 01/31/2016 01/30/2016  Hemoglobin 11.7 - 15.5 g/dL 9.9(L) 9.2(L) 9.0(L) 9.5(L)  Hematocrit 35.0 - 45.0 % 31.0(L) 27.8(L) 27.1(L) 29.1(L)  White count 3.8 - 10.8 K/uL 15.7(H) 12.8(H) 13.2(H) 12.3(H)  Platelet count 140 - 400 K/uL 333 212 187 199  Sodium 135 - 146 mmol/L 140 (None) 137 137  Potassium 3.5 - 5.3 mmol/L 4.3 (None) 3.4(L) 3.7  Calcium 8.6 - 10.4 mg/dL 9.8 (None) 8.8(L) 8.7(L)  Phosphorus - (None) (None) (None) (None)  Creatinine 0.60 - 0.88 mg/dL 0.92(H) (None) 0.79 0.78  AST 10 - 35 U/L 13 (None) (None) (None)  Alk Phos 33 - 130 U/L 79 (None) (None) (None)  Bilirubin 0.2 - 1.2 mg/dL 0.2 (None) (None) (None)  Glucose 65 - 99 mg/dL 93 (None) 104(H) 129(H)  Cholesterol - (None) (None) (None) (None)  HDL cholesterol - (None) (None) (None) (None)  Triglycerides - (None) (None) (None) (None)  LDL Direct - (None) (None) (None) (None)  LDL Calc - (None) (None) (None) (None)  Total protein 6.1 - 8.1 g/dL 6.0(L) (None) (None) (None)  Albumin 3.6 - 5.1 g/dL 3.3(L) (None) (None) (None)  Some recent data might be hidden   Lab Results  Component Value Date   TSH 2.23 03/12/2016   TSH 1.336 01/29/2016   TSH 2.91 09/23/2013   Lab Results  Component Value Date   BUN 31 (H) 03/12/2016   BUN 17 01/31/2016   BUN 21 (H) 01/30/2016   No results found for: HGBA1C  Assessment/Plan 1. Insomnia, unspecified type Continue melatonin and Lexapro for another week. If still not sleeping, consider [place].  2.  Dementia due to Parkinson's disease without behavioral disturbance ([place]) unchanged  3. Abnormality of gait Bed and wheelchair bound  4. Hallucinations resolved  5. Tremor unchanged  6. Lymphocytosis Likely CLL - CBC with Differential/Platelet; Future  7. Anemia, unspecified type Improving since last checked - CBC with Differential/Platelet; Future - IBC panel; Future  8. CKD (chronic kidney disease) stage 2, GFR 60-89 ml/min Increase fluid intake - Basic metabolic panel; Future

## 2016-03-28 ENCOUNTER — Telehealth: Payer: Self-pay

## 2016-03-28 DIAGNOSIS — Z9181 History of falling: Secondary | ICD-10-CM | POA: Diagnosis not present

## 2016-03-28 DIAGNOSIS — Z96641 Presence of right artificial hip joint: Secondary | ICD-10-CM | POA: Diagnosis not present

## 2016-03-28 DIAGNOSIS — M6281 Muscle weakness (generalized): Secondary | ICD-10-CM | POA: Diagnosis not present

## 2016-03-28 DIAGNOSIS — R488 Other symbolic dysfunctions: Secondary | ICD-10-CM | POA: Diagnosis not present

## 2016-03-28 NOTE — Telephone Encounter (Signed)
Incoming fax received from [employer] [facility] [employer] and returned fax.   I called [facility] to inform them that Dr.Green will sign when in office on Tuesday.

## 2016-03-29 DIAGNOSIS — M6281 Muscle weakness (generalized): Secondary | ICD-10-CM | POA: Diagnosis not present

## 2016-03-29 DIAGNOSIS — Z9181 History of falling: Secondary | ICD-10-CM | POA: Diagnosis not present

## 2016-03-29 DIAGNOSIS — Z96641 Presence of right artificial hip joint: Secondary | ICD-10-CM | POA: Diagnosis not present

## 2016-03-29 DIAGNOSIS — R488 Other symbolic dysfunctions: Secondary | ICD-10-CM | POA: Diagnosis not present

## 2016-04-01 DIAGNOSIS — R488 Other symbolic dysfunctions: Secondary | ICD-10-CM | POA: Diagnosis not present

## 2016-04-01 DIAGNOSIS — Z96641 Presence of right artificial hip joint: Secondary | ICD-10-CM | POA: Diagnosis not present

## 2016-04-01 DIAGNOSIS — Z9181 History of falling: Secondary | ICD-10-CM | POA: Diagnosis not present

## 2016-04-01 DIAGNOSIS — M6281 Muscle weakness (generalized): Secondary | ICD-10-CM | POA: Diagnosis not present

## 2016-04-02 DIAGNOSIS — Z9181 History of falling: Secondary | ICD-10-CM | POA: Diagnosis not present

## 2016-04-02 DIAGNOSIS — Z96641 Presence of right artificial hip joint: Secondary | ICD-10-CM | POA: Diagnosis not present

## 2016-04-02 DIAGNOSIS — R488 Other symbolic dysfunctions: Secondary | ICD-10-CM | POA: Diagnosis not present

## 2016-04-02 DIAGNOSIS — M6281 Muscle weakness (generalized): Secondary | ICD-10-CM | POA: Diagnosis not present

## 2016-04-04 ENCOUNTER — Telehealth: Payer: Self-pay | Admitting: Internal Medicine

## 2016-04-04 DIAGNOSIS — I714 Abdominal aortic aneurysm, without rupture: Secondary | ICD-10-CM | POA: Diagnosis not present

## 2016-04-04 DIAGNOSIS — G20 Parkinson's disease: Secondary | ICD-10-CM | POA: Diagnosis not present

## 2016-04-04 DIAGNOSIS — R269 Unspecified abnormalities of gait and mobility: Secondary | ICD-10-CM | POA: Diagnosis not present

## 2016-04-04 DIAGNOSIS — D649 Anemia, unspecified: Secondary | ICD-10-CM | POA: Diagnosis not present

## 2016-04-04 DIAGNOSIS — F329 Major depressive disorder, single episode, unspecified: Secondary | ICD-10-CM | POA: Diagnosis not present

## 2016-04-04 NOTE — Telephone Encounter (Signed)
I received a call from [place] at [facility], she stated patient was admitted to their facility for long term care and as long as she is there she will see their doctors.

## 2016-04-08 DIAGNOSIS — G20 Parkinson's disease: Secondary | ICD-10-CM | POA: Diagnosis not present

## 2016-04-08 DIAGNOSIS — R2681 Unsteadiness on feet: Secondary | ICD-10-CM | POA: Diagnosis not present

## 2016-04-08 DIAGNOSIS — Z96641 Presence of right artificial hip joint: Secondary | ICD-10-CM | POA: Diagnosis not present

## 2016-04-09 DIAGNOSIS — Z96641 Presence of right artificial hip joint: Secondary | ICD-10-CM | POA: Diagnosis not present

## 2016-04-09 DIAGNOSIS — R2681 Unsteadiness on feet: Secondary | ICD-10-CM | POA: Diagnosis not present

## 2016-04-09 DIAGNOSIS — G20 Parkinson's disease: Secondary | ICD-10-CM | POA: Diagnosis not present

## 2016-04-10 DIAGNOSIS — G20 Parkinson's disease: Secondary | ICD-10-CM | POA: Diagnosis not present

## 2016-04-10 DIAGNOSIS — Z96641 Presence of right artificial hip joint: Secondary | ICD-10-CM | POA: Diagnosis not present

## 2016-04-10 DIAGNOSIS — R2681 Unsteadiness on feet: Secondary | ICD-10-CM | POA: Diagnosis not present

## 2016-04-11 DIAGNOSIS — R2681 Unsteadiness on feet: Secondary | ICD-10-CM | POA: Diagnosis not present

## 2016-04-11 DIAGNOSIS — G20 Parkinson's disease: Secondary | ICD-10-CM | POA: Diagnosis not present

## 2016-04-11 DIAGNOSIS — Z96641 Presence of right artificial hip joint: Secondary | ICD-10-CM | POA: Diagnosis not present

## 2016-04-12 DIAGNOSIS — G20 Parkinson's disease: Secondary | ICD-10-CM | POA: Diagnosis not present

## 2016-04-12 DIAGNOSIS — Z96641 Presence of right artificial hip joint: Secondary | ICD-10-CM | POA: Diagnosis not present

## 2016-04-12 DIAGNOSIS — R2681 Unsteadiness on feet: Secondary | ICD-10-CM | POA: Diagnosis not present

## 2016-04-14 DIAGNOSIS — G301 Alzheimer's disease with late onset: Secondary | ICD-10-CM | POA: Diagnosis not present

## 2016-04-14 DIAGNOSIS — G20 Parkinson's disease: Secondary | ICD-10-CM | POA: Diagnosis not present

## 2016-04-16 DIAGNOSIS — R2681 Unsteadiness on feet: Secondary | ICD-10-CM | POA: Diagnosis not present

## 2016-04-16 DIAGNOSIS — Z96641 Presence of right artificial hip joint: Secondary | ICD-10-CM | POA: Diagnosis not present

## 2016-04-16 DIAGNOSIS — G20 Parkinson's disease: Secondary | ICD-10-CM | POA: Diagnosis not present

## 2016-04-17 DIAGNOSIS — G20 Parkinson's disease: Secondary | ICD-10-CM | POA: Diagnosis not present

## 2016-04-17 DIAGNOSIS — R2681 Unsteadiness on feet: Secondary | ICD-10-CM | POA: Diagnosis not present

## 2016-04-17 DIAGNOSIS — Z96641 Presence of right artificial hip joint: Secondary | ICD-10-CM | POA: Diagnosis not present

## 2016-04-19 DIAGNOSIS — R2681 Unsteadiness on feet: Secondary | ICD-10-CM | POA: Diagnosis not present

## 2016-04-19 DIAGNOSIS — G20 Parkinson's disease: Secondary | ICD-10-CM | POA: Diagnosis not present

## 2016-04-19 DIAGNOSIS — Z96641 Presence of right artificial hip joint: Secondary | ICD-10-CM | POA: Diagnosis not present

## 2016-04-20 DIAGNOSIS — Z96641 Presence of right artificial hip joint: Secondary | ICD-10-CM | POA: Diagnosis not present

## 2016-04-20 DIAGNOSIS — G20 Parkinson's disease: Secondary | ICD-10-CM | POA: Diagnosis not present

## 2016-04-20 DIAGNOSIS — R2681 Unsteadiness on feet: Secondary | ICD-10-CM | POA: Diagnosis not present

## 2016-04-21 DIAGNOSIS — G20 Parkinson's disease: Secondary | ICD-10-CM | POA: Diagnosis not present

## 2016-04-21 DIAGNOSIS — Z96641 Presence of right artificial hip joint: Secondary | ICD-10-CM | POA: Diagnosis not present

## 2016-04-21 DIAGNOSIS — R2681 Unsteadiness on feet: Secondary | ICD-10-CM | POA: Diagnosis not present

## 2016-04-22 DIAGNOSIS — G20 Parkinson's disease: Secondary | ICD-10-CM | POA: Diagnosis not present

## 2016-04-22 DIAGNOSIS — R2681 Unsteadiness on feet: Secondary | ICD-10-CM | POA: Diagnosis not present

## 2016-04-22 DIAGNOSIS — Z96641 Presence of right artificial hip joint: Secondary | ICD-10-CM | POA: Diagnosis not present

## 2016-04-23 DIAGNOSIS — R2681 Unsteadiness on feet: Secondary | ICD-10-CM | POA: Diagnosis not present

## 2016-04-23 DIAGNOSIS — G20 Parkinson's disease: Secondary | ICD-10-CM | POA: Diagnosis not present

## 2016-04-23 DIAGNOSIS — Z96641 Presence of right artificial hip joint: Secondary | ICD-10-CM | POA: Diagnosis not present

## 2016-04-24 DIAGNOSIS — G20 Parkinson's disease: Secondary | ICD-10-CM | POA: Diagnosis not present

## 2016-04-24 DIAGNOSIS — R2681 Unsteadiness on feet: Secondary | ICD-10-CM | POA: Diagnosis not present

## 2016-04-24 DIAGNOSIS — Z96641 Presence of right artificial hip joint: Secondary | ICD-10-CM | POA: Diagnosis not present

## 2016-04-25 DIAGNOSIS — G20 Parkinson's disease: Secondary | ICD-10-CM | POA: Diagnosis not present

## 2016-04-25 DIAGNOSIS — Z96641 Presence of right artificial hip joint: Secondary | ICD-10-CM | POA: Diagnosis not present

## 2016-04-25 DIAGNOSIS — R2681 Unsteadiness on feet: Secondary | ICD-10-CM | POA: Diagnosis not present

## 2016-04-26 DIAGNOSIS — R2681 Unsteadiness on feet: Secondary | ICD-10-CM | POA: Diagnosis not present

## 2016-04-26 DIAGNOSIS — Z96641 Presence of right artificial hip joint: Secondary | ICD-10-CM | POA: Diagnosis not present

## 2016-04-26 DIAGNOSIS — G20 Parkinson's disease: Secondary | ICD-10-CM | POA: Diagnosis not present

## 2016-04-29 DIAGNOSIS — R2681 Unsteadiness on feet: Secondary | ICD-10-CM | POA: Diagnosis not present

## 2016-04-29 DIAGNOSIS — G20 Parkinson's disease: Secondary | ICD-10-CM | POA: Diagnosis not present

## 2016-04-29 DIAGNOSIS — Z96641 Presence of right artificial hip joint: Secondary | ICD-10-CM | POA: Diagnosis not present

## 2016-04-30 DIAGNOSIS — G20 Parkinson's disease: Secondary | ICD-10-CM | POA: Diagnosis not present

## 2016-04-30 DIAGNOSIS — R2681 Unsteadiness on feet: Secondary | ICD-10-CM | POA: Diagnosis not present

## 2016-04-30 DIAGNOSIS — Z96641 Presence of right artificial hip joint: Secondary | ICD-10-CM | POA: Diagnosis not present

## 2016-05-02 DIAGNOSIS — D649 Anemia, unspecified: Secondary | ICD-10-CM | POA: Diagnosis not present

## 2016-05-06 DIAGNOSIS — E785 Hyperlipidemia, unspecified: Secondary | ICD-10-CM | POA: Diagnosis not present

## 2016-05-06 DIAGNOSIS — G250 Essential tremor: Secondary | ICD-10-CM | POA: Diagnosis not present

## 2016-05-06 DIAGNOSIS — G309 Alzheimer's disease, unspecified: Secondary | ICD-10-CM | POA: Diagnosis not present

## 2016-05-06 DIAGNOSIS — D649 Anemia, unspecified: Secondary | ICD-10-CM | POA: Diagnosis not present

## 2016-05-07 DIAGNOSIS — R2681 Unsteadiness on feet: Secondary | ICD-10-CM | POA: Diagnosis not present

## 2016-05-07 DIAGNOSIS — G20 Parkinson's disease: Secondary | ICD-10-CM | POA: Diagnosis not present

## 2016-05-07 DIAGNOSIS — Z96641 Presence of right artificial hip joint: Secondary | ICD-10-CM | POA: Diagnosis not present

## 2016-05-08 DIAGNOSIS — Z96641 Presence of right artificial hip joint: Secondary | ICD-10-CM | POA: Diagnosis not present

## 2016-05-08 DIAGNOSIS — G20 Parkinson's disease: Secondary | ICD-10-CM | POA: Diagnosis not present

## 2016-05-08 DIAGNOSIS — R2681 Unsteadiness on feet: Secondary | ICD-10-CM | POA: Diagnosis not present

## 2016-05-09 DIAGNOSIS — G20 Parkinson's disease: Secondary | ICD-10-CM | POA: Diagnosis not present

## 2016-05-09 DIAGNOSIS — Z96641 Presence of right artificial hip joint: Secondary | ICD-10-CM | POA: Diagnosis not present

## 2016-05-09 DIAGNOSIS — R2681 Unsteadiness on feet: Secondary | ICD-10-CM | POA: Diagnosis not present

## 2016-05-10 DIAGNOSIS — G20 Parkinson's disease: Secondary | ICD-10-CM | POA: Diagnosis not present

## 2016-05-10 DIAGNOSIS — R2681 Unsteadiness on feet: Secondary | ICD-10-CM | POA: Diagnosis not present

## 2016-05-10 DIAGNOSIS — Z96641 Presence of right artificial hip joint: Secondary | ICD-10-CM | POA: Diagnosis not present

## 2016-05-13 DIAGNOSIS — G20 Parkinson's disease: Secondary | ICD-10-CM | POA: Diagnosis not present

## 2016-05-13 DIAGNOSIS — R2681 Unsteadiness on feet: Secondary | ICD-10-CM | POA: Diagnosis not present

## 2016-05-13 DIAGNOSIS — Z96641 Presence of right artificial hip joint: Secondary | ICD-10-CM | POA: Diagnosis not present

## 2016-05-14 DIAGNOSIS — R2681 Unsteadiness on feet: Secondary | ICD-10-CM | POA: Diagnosis not present

## 2016-05-14 DIAGNOSIS — G20 Parkinson's disease: Secondary | ICD-10-CM | POA: Diagnosis not present

## 2016-05-14 DIAGNOSIS — Z96641 Presence of right artificial hip joint: Secondary | ICD-10-CM | POA: Diagnosis not present

## 2016-05-15 DIAGNOSIS — R2681 Unsteadiness on feet: Secondary | ICD-10-CM | POA: Diagnosis not present

## 2016-05-15 DIAGNOSIS — G20 Parkinson's disease: Secondary | ICD-10-CM | POA: Diagnosis not present

## 2016-05-15 DIAGNOSIS — Z96641 Presence of right artificial hip joint: Secondary | ICD-10-CM | POA: Diagnosis not present

## 2016-05-16 DIAGNOSIS — G20 Parkinson's disease: Secondary | ICD-10-CM | POA: Diagnosis not present

## 2016-05-16 DIAGNOSIS — Z96641 Presence of right artificial hip joint: Secondary | ICD-10-CM | POA: Diagnosis not present

## 2016-05-16 DIAGNOSIS — R2681 Unsteadiness on feet: Secondary | ICD-10-CM | POA: Diagnosis not present

## 2016-05-17 DIAGNOSIS — G20 Parkinson's disease: Secondary | ICD-10-CM | POA: Diagnosis not present

## 2016-05-17 DIAGNOSIS — Z96641 Presence of right artificial hip joint: Secondary | ICD-10-CM | POA: Diagnosis not present

## 2016-05-17 DIAGNOSIS — R2681 Unsteadiness on feet: Secondary | ICD-10-CM | POA: Diagnosis not present

## 2016-05-20 DIAGNOSIS — Z96641 Presence of right artificial hip joint: Secondary | ICD-10-CM | POA: Diagnosis not present

## 2016-05-20 DIAGNOSIS — G20 Parkinson's disease: Secondary | ICD-10-CM | POA: Diagnosis not present

## 2016-05-20 DIAGNOSIS — R2681 Unsteadiness on feet: Secondary | ICD-10-CM | POA: Diagnosis not present

## 2016-05-21 DIAGNOSIS — G20 Parkinson's disease: Secondary | ICD-10-CM | POA: Diagnosis not present

## 2016-05-21 DIAGNOSIS — R2681 Unsteadiness on feet: Secondary | ICD-10-CM | POA: Diagnosis not present

## 2016-05-21 DIAGNOSIS — Z96641 Presence of right artificial hip joint: Secondary | ICD-10-CM | POA: Diagnosis not present

## 2016-05-22 DIAGNOSIS — R2681 Unsteadiness on feet: Secondary | ICD-10-CM | POA: Diagnosis not present

## 2016-05-22 DIAGNOSIS — Z96641 Presence of right artificial hip joint: Secondary | ICD-10-CM | POA: Diagnosis not present

## 2016-05-22 DIAGNOSIS — G20 Parkinson's disease: Secondary | ICD-10-CM | POA: Diagnosis not present

## 2016-05-23 DIAGNOSIS — R2681 Unsteadiness on feet: Secondary | ICD-10-CM | POA: Diagnosis not present

## 2016-05-23 DIAGNOSIS — Z96641 Presence of right artificial hip joint: Secondary | ICD-10-CM | POA: Diagnosis not present

## 2016-05-23 DIAGNOSIS — G20 Parkinson's disease: Secondary | ICD-10-CM | POA: Diagnosis not present

## 2016-05-24 DIAGNOSIS — G20 Parkinson's disease: Secondary | ICD-10-CM | POA: Diagnosis not present

## 2016-05-24 DIAGNOSIS — R2681 Unsteadiness on feet: Secondary | ICD-10-CM | POA: Diagnosis not present

## 2016-05-24 DIAGNOSIS — Z96641 Presence of right artificial hip joint: Secondary | ICD-10-CM | POA: Diagnosis not present

## 2016-05-27 DIAGNOSIS — Z96641 Presence of right artificial hip joint: Secondary | ICD-10-CM | POA: Diagnosis not present

## 2016-05-27 DIAGNOSIS — G20 Parkinson's disease: Secondary | ICD-10-CM | POA: Diagnosis not present

## 2016-05-27 DIAGNOSIS — R2681 Unsteadiness on feet: Secondary | ICD-10-CM | POA: Diagnosis not present

## 2016-05-28 DIAGNOSIS — Z96641 Presence of right artificial hip joint: Secondary | ICD-10-CM | POA: Diagnosis not present

## 2016-05-28 DIAGNOSIS — R2681 Unsteadiness on feet: Secondary | ICD-10-CM | POA: Diagnosis not present

## 2016-05-28 DIAGNOSIS — G20 Parkinson's disease: Secondary | ICD-10-CM | POA: Diagnosis not present

## 2016-06-23 DIAGNOSIS — G301 Alzheimer's disease with late onset: Secondary | ICD-10-CM | POA: Diagnosis not present

## 2016-06-23 DIAGNOSIS — D649 Anemia, unspecified: Secondary | ICD-10-CM | POA: Diagnosis not present

## 2016-06-23 DIAGNOSIS — G20 Parkinson's disease: Secondary | ICD-10-CM | POA: Diagnosis not present

## 2016-08-07 DIAGNOSIS — R197 Diarrhea, unspecified: Secondary | ICD-10-CM | POA: Diagnosis not present

## 2016-08-07 DIAGNOSIS — A0472 Enterocolitis due to Clostridium difficile, not specified as recurrent: Secondary | ICD-10-CM | POA: Diagnosis not present

## 2016-08-14 DIAGNOSIS — D649 Anemia, unspecified: Secondary | ICD-10-CM | POA: Diagnosis not present

## 2016-08-14 DIAGNOSIS — G301 Alzheimer's disease with late onset: Secondary | ICD-10-CM | POA: Diagnosis not present

## 2016-08-14 DIAGNOSIS — G20 Parkinson's disease: Secondary | ICD-10-CM | POA: Diagnosis not present

## 2016-09-18 DIAGNOSIS — R609 Edema, unspecified: Secondary | ICD-10-CM | POA: Diagnosis not present

## 2016-09-18 DIAGNOSIS — F330 Major depressive disorder, recurrent, mild: Secondary | ICD-10-CM | POA: Diagnosis not present

## 2016-09-18 DIAGNOSIS — D649 Anemia, unspecified: Secondary | ICD-10-CM | POA: Diagnosis not present

## 2016-09-18 DIAGNOSIS — G4700 Insomnia, unspecified: Secondary | ICD-10-CM | POA: Diagnosis not present

## 2016-10-01 DIAGNOSIS — M6281 Muscle weakness (generalized): Secondary | ICD-10-CM | POA: Diagnosis not present

## 2016-10-01 DIAGNOSIS — G20 Parkinson's disease: Secondary | ICD-10-CM | POA: Diagnosis not present

## 2016-10-02 DIAGNOSIS — M6281 Muscle weakness (generalized): Secondary | ICD-10-CM | POA: Diagnosis not present

## 2016-10-02 DIAGNOSIS — G20 Parkinson's disease: Secondary | ICD-10-CM | POA: Diagnosis not present

## 2016-10-03 DIAGNOSIS — M6281 Muscle weakness (generalized): Secondary | ICD-10-CM | POA: Diagnosis not present

## 2016-10-03 DIAGNOSIS — G20 Parkinson's disease: Secondary | ICD-10-CM | POA: Diagnosis not present

## 2016-10-04 DIAGNOSIS — G20 Parkinson's disease: Secondary | ICD-10-CM | POA: Diagnosis not present

## 2016-10-04 DIAGNOSIS — M6281 Muscle weakness (generalized): Secondary | ICD-10-CM | POA: Diagnosis not present

## 2016-10-07 DIAGNOSIS — M6281 Muscle weakness (generalized): Secondary | ICD-10-CM | POA: Diagnosis not present

## 2016-10-07 DIAGNOSIS — G20 Parkinson's disease: Secondary | ICD-10-CM | POA: Diagnosis not present

## 2016-10-08 DIAGNOSIS — G20 Parkinson's disease: Secondary | ICD-10-CM | POA: Diagnosis not present

## 2016-10-08 DIAGNOSIS — M6281 Muscle weakness (generalized): Secondary | ICD-10-CM | POA: Diagnosis not present

## 2016-10-09 DIAGNOSIS — G20 Parkinson's disease: Secondary | ICD-10-CM | POA: Diagnosis not present

## 2016-10-09 DIAGNOSIS — M6281 Muscle weakness (generalized): Secondary | ICD-10-CM | POA: Diagnosis not present

## 2016-10-10 DIAGNOSIS — M6281 Muscle weakness (generalized): Secondary | ICD-10-CM | POA: Diagnosis not present

## 2016-10-10 DIAGNOSIS — G20 Parkinson's disease: Secondary | ICD-10-CM | POA: Diagnosis not present

## 2016-10-11 DIAGNOSIS — M6281 Muscle weakness (generalized): Secondary | ICD-10-CM | POA: Diagnosis not present

## 2016-10-11 DIAGNOSIS — G20 Parkinson's disease: Secondary | ICD-10-CM | POA: Diagnosis not present

## 2016-10-14 DIAGNOSIS — G20 Parkinson's disease: Secondary | ICD-10-CM | POA: Diagnosis not present

## 2016-10-14 DIAGNOSIS — M6281 Muscle weakness (generalized): Secondary | ICD-10-CM | POA: Diagnosis not present

## 2016-10-15 DIAGNOSIS — G20 Parkinson's disease: Secondary | ICD-10-CM | POA: Diagnosis not present

## 2016-10-15 DIAGNOSIS — M6281 Muscle weakness (generalized): Secondary | ICD-10-CM | POA: Diagnosis not present

## 2016-10-16 DIAGNOSIS — G20 Parkinson's disease: Secondary | ICD-10-CM | POA: Diagnosis not present

## 2016-10-16 DIAGNOSIS — M6281 Muscle weakness (generalized): Secondary | ICD-10-CM | POA: Diagnosis not present

## 2016-10-17 DIAGNOSIS — G20 Parkinson's disease: Secondary | ICD-10-CM | POA: Diagnosis not present

## 2016-10-17 DIAGNOSIS — M6281 Muscle weakness (generalized): Secondary | ICD-10-CM | POA: Diagnosis not present

## 2016-10-18 DIAGNOSIS — M6281 Muscle weakness (generalized): Secondary | ICD-10-CM | POA: Diagnosis not present

## 2016-10-18 DIAGNOSIS — G20 Parkinson's disease: Secondary | ICD-10-CM | POA: Diagnosis not present

## 2016-10-21 DIAGNOSIS — G4700 Insomnia, unspecified: Secondary | ICD-10-CM | POA: Diagnosis not present

## 2016-10-21 DIAGNOSIS — M6281 Muscle weakness (generalized): Secondary | ICD-10-CM | POA: Diagnosis not present

## 2016-10-21 DIAGNOSIS — F330 Major depressive disorder, recurrent, mild: Secondary | ICD-10-CM | POA: Diagnosis not present

## 2016-10-21 DIAGNOSIS — F4541 Pain disorder exclusively related to psychological factors: Secondary | ICD-10-CM | POA: Diagnosis not present

## 2016-10-21 DIAGNOSIS — G20 Parkinson's disease: Secondary | ICD-10-CM | POA: Diagnosis not present

## 2016-10-21 DIAGNOSIS — E43 Unspecified severe protein-calorie malnutrition: Secondary | ICD-10-CM | POA: Diagnosis not present

## 2016-10-21 DIAGNOSIS — R54 Age-related physical debility: Secondary | ICD-10-CM | POA: Diagnosis not present

## 2016-10-22 DIAGNOSIS — M6281 Muscle weakness (generalized): Secondary | ICD-10-CM | POA: Diagnosis not present

## 2016-10-22 DIAGNOSIS — G20 Parkinson's disease: Secondary | ICD-10-CM | POA: Diagnosis not present

## 2016-10-25 DIAGNOSIS — R54 Age-related physical debility: Secondary | ICD-10-CM | POA: Diagnosis not present

## 2016-10-25 DIAGNOSIS — R52 Pain, unspecified: Secondary | ICD-10-CM | POA: Diagnosis not present

## 2016-10-25 DIAGNOSIS — N189 Chronic kidney disease, unspecified: Secondary | ICD-10-CM | POA: Diagnosis not present

## 2016-10-25 DIAGNOSIS — D649 Anemia, unspecified: Secondary | ICD-10-CM | POA: Diagnosis not present

## 2016-10-25 DIAGNOSIS — M6281 Muscle weakness (generalized): Secondary | ICD-10-CM | POA: Diagnosis not present

## 2016-10-25 DIAGNOSIS — E43 Unspecified severe protein-calorie malnutrition: Secondary | ICD-10-CM | POA: Diagnosis not present

## 2016-11-05 DIAGNOSIS — G3183 Dementia with Lewy bodies: Secondary | ICD-10-CM | POA: Diagnosis not present

## 2016-11-18 DIAGNOSIS — F0280 Dementia in other diseases classified elsewhere without behavioral disturbance: Secondary | ICD-10-CM | POA: Diagnosis not present

## 2016-11-18 DIAGNOSIS — E43 Unspecified severe protein-calorie malnutrition: Secondary | ICD-10-CM | POA: Diagnosis not present

## 2016-11-18 DIAGNOSIS — D649 Anemia, unspecified: Secondary | ICD-10-CM | POA: Diagnosis not present

## 2016-11-18 DIAGNOSIS — G4700 Insomnia, unspecified: Secondary | ICD-10-CM | POA: Diagnosis not present

## 2016-11-18 DIAGNOSIS — R52 Pain, unspecified: Secondary | ICD-10-CM | POA: Diagnosis not present

## 2016-11-18 DIAGNOSIS — R54 Age-related physical debility: Secondary | ICD-10-CM | POA: Diagnosis not present

## 2016-11-18 DIAGNOSIS — G20 Parkinson's disease: Secondary | ICD-10-CM | POA: Diagnosis not present

## 2016-11-18 DIAGNOSIS — F330 Major depressive disorder, recurrent, mild: Secondary | ICD-10-CM | POA: Diagnosis not present

## 2016-11-18 DIAGNOSIS — M6281 Muscle weakness (generalized): Secondary | ICD-10-CM | POA: Diagnosis not present

## 2016-11-18 DIAGNOSIS — N189 Chronic kidney disease, unspecified: Secondary | ICD-10-CM | POA: Diagnosis not present

## 2016-11-19 DIAGNOSIS — D649 Anemia, unspecified: Secondary | ICD-10-CM | POA: Diagnosis not present

## 2016-11-19 DIAGNOSIS — N189 Chronic kidney disease, unspecified: Secondary | ICD-10-CM | POA: Diagnosis not present

## 2016-11-21 DIAGNOSIS — F330 Major depressive disorder, recurrent, mild: Secondary | ICD-10-CM | POA: Diagnosis not present

## 2016-11-21 DIAGNOSIS — F0280 Dementia in other diseases classified elsewhere without behavioral disturbance: Secondary | ICD-10-CM | POA: Diagnosis not present

## 2016-11-26 DIAGNOSIS — R52 Pain, unspecified: Secondary | ICD-10-CM | POA: Diagnosis not present

## 2016-11-26 DIAGNOSIS — G4700 Insomnia, unspecified: Secondary | ICD-10-CM | POA: Diagnosis not present

## 2016-11-26 DIAGNOSIS — D649 Anemia, unspecified: Secondary | ICD-10-CM | POA: Diagnosis not present

## 2016-11-26 DIAGNOSIS — G20 Parkinson's disease: Secondary | ICD-10-CM | POA: Diagnosis not present

## 2016-11-26 DIAGNOSIS — N189 Chronic kidney disease, unspecified: Secondary | ICD-10-CM | POA: Diagnosis not present

## 2016-11-26 DIAGNOSIS — F330 Major depressive disorder, recurrent, mild: Secondary | ICD-10-CM | POA: Diagnosis not present

## 2016-11-26 DIAGNOSIS — E876 Hypokalemia: Secondary | ICD-10-CM | POA: Diagnosis not present

## 2016-11-26 DIAGNOSIS — F0280 Dementia in other diseases classified elsewhere without behavioral disturbance: Secondary | ICD-10-CM | POA: Diagnosis not present

## 2016-11-26 DIAGNOSIS — E43 Unspecified severe protein-calorie malnutrition: Secondary | ICD-10-CM | POA: Diagnosis not present

## 2016-11-26 DIAGNOSIS — M6281 Muscle weakness (generalized): Secondary | ICD-10-CM | POA: Diagnosis not present

## 2016-11-29 DIAGNOSIS — D649 Anemia, unspecified: Secondary | ICD-10-CM | POA: Diagnosis not present

## 2016-11-29 DIAGNOSIS — R770 Abnormality of albumin: Secondary | ICD-10-CM | POA: Diagnosis not present

## 2016-12-02 DIAGNOSIS — R2689 Other abnormalities of gait and mobility: Secondary | ICD-10-CM | POA: Diagnosis not present

## 2016-12-02 DIAGNOSIS — Z96641 Presence of right artificial hip joint: Secondary | ICD-10-CM | POA: Diagnosis not present

## 2016-12-02 DIAGNOSIS — G20 Parkinson's disease: Secondary | ICD-10-CM | POA: Diagnosis not present

## 2016-12-26 DIAGNOSIS — M6281 Muscle weakness (generalized): Secondary | ICD-10-CM | POA: Diagnosis not present

## 2016-12-26 DIAGNOSIS — G20 Parkinson's disease: Secondary | ICD-10-CM | POA: Diagnosis not present

## 2016-12-26 DIAGNOSIS — F0280 Dementia in other diseases classified elsewhere without behavioral disturbance: Secondary | ICD-10-CM | POA: Diagnosis not present

## 2016-12-26 DIAGNOSIS — N189 Chronic kidney disease, unspecified: Secondary | ICD-10-CM | POA: Diagnosis not present

## 2016-12-26 DIAGNOSIS — E876 Hypokalemia: Secondary | ICD-10-CM | POA: Diagnosis not present

## 2016-12-26 DIAGNOSIS — G4700 Insomnia, unspecified: Secondary | ICD-10-CM | POA: Diagnosis not present

## 2016-12-26 DIAGNOSIS — R52 Pain, unspecified: Secondary | ICD-10-CM | POA: Diagnosis not present

## 2016-12-26 DIAGNOSIS — F330 Major depressive disorder, recurrent, mild: Secondary | ICD-10-CM | POA: Diagnosis not present

## 2016-12-26 DIAGNOSIS — D649 Anemia, unspecified: Secondary | ICD-10-CM | POA: Diagnosis not present

## 2016-12-26 DIAGNOSIS — E43 Unspecified severe protein-calorie malnutrition: Secondary | ICD-10-CM | POA: Diagnosis not present

## 2017-01-03 DIAGNOSIS — R221 Localized swelling, mass and lump, neck: Secondary | ICD-10-CM | POA: Diagnosis not present

## 2017-01-06 DIAGNOSIS — Z961 Presence of intraocular lens: Secondary | ICD-10-CM | POA: Diagnosis not present

## 2017-01-06 DIAGNOSIS — H35033 Hypertensive retinopathy, bilateral: Secondary | ICD-10-CM | POA: Diagnosis not present

## 2017-01-06 DIAGNOSIS — H353131 Nonexudative age-related macular degeneration, bilateral, early dry stage: Secondary | ICD-10-CM | POA: Diagnosis not present

## 2017-01-06 DIAGNOSIS — H18413 Arcus senilis, bilateral: Secondary | ICD-10-CM | POA: Diagnosis not present

## 2017-01-22 DIAGNOSIS — D649 Anemia, unspecified: Secondary | ICD-10-CM | POA: Diagnosis not present

## 2017-01-22 DIAGNOSIS — G4700 Insomnia, unspecified: Secondary | ICD-10-CM | POA: Diagnosis not present

## 2017-01-22 DIAGNOSIS — M6281 Muscle weakness (generalized): Secondary | ICD-10-CM | POA: Diagnosis not present

## 2017-01-22 DIAGNOSIS — F0280 Dementia in other diseases classified elsewhere without behavioral disturbance: Secondary | ICD-10-CM | POA: Diagnosis not present

## 2017-01-22 DIAGNOSIS — N189 Chronic kidney disease, unspecified: Secondary | ICD-10-CM | POA: Diagnosis not present

## 2017-01-22 DIAGNOSIS — R52 Pain, unspecified: Secondary | ICD-10-CM | POA: Diagnosis not present

## 2017-01-22 DIAGNOSIS — F330 Major depressive disorder, recurrent, mild: Secondary | ICD-10-CM | POA: Diagnosis not present

## 2017-01-22 DIAGNOSIS — G20 Parkinson's disease: Secondary | ICD-10-CM | POA: Diagnosis not present

## 2017-01-22 DIAGNOSIS — E43 Unspecified severe protein-calorie malnutrition: Secondary | ICD-10-CM | POA: Diagnosis not present

## 2017-01-22 DIAGNOSIS — E876 Hypokalemia: Secondary | ICD-10-CM | POA: Diagnosis not present

## 2017-01-24 DIAGNOSIS — G20 Parkinson's disease: Secondary | ICD-10-CM | POA: Diagnosis not present

## 2017-01-24 DIAGNOSIS — G4700 Insomnia, unspecified: Secondary | ICD-10-CM | POA: Diagnosis not present

## 2017-01-24 DIAGNOSIS — Q845 Enlarged and hypertrophic nails: Secondary | ICD-10-CM | POA: Diagnosis not present

## 2017-01-24 DIAGNOSIS — F330 Major depressive disorder, recurrent, mild: Secondary | ICD-10-CM | POA: Diagnosis not present

## 2017-01-24 DIAGNOSIS — B351 Tinea unguium: Secondary | ICD-10-CM | POA: Diagnosis not present

## 2017-01-24 DIAGNOSIS — E43 Unspecified severe protein-calorie malnutrition: Secondary | ICD-10-CM | POA: Diagnosis not present

## 2017-01-24 DIAGNOSIS — E876 Hypokalemia: Secondary | ICD-10-CM | POA: Diagnosis not present

## 2017-01-24 DIAGNOSIS — D649 Anemia, unspecified: Secondary | ICD-10-CM | POA: Diagnosis not present

## 2017-01-24 DIAGNOSIS — R52 Pain, unspecified: Secondary | ICD-10-CM | POA: Diagnosis not present

## 2017-01-24 DIAGNOSIS — N189 Chronic kidney disease, unspecified: Secondary | ICD-10-CM | POA: Diagnosis not present

## 2017-01-24 DIAGNOSIS — M6281 Muscle weakness (generalized): Secondary | ICD-10-CM | POA: Diagnosis not present

## 2017-01-24 DIAGNOSIS — F0280 Dementia in other diseases classified elsewhere without behavioral disturbance: Secondary | ICD-10-CM | POA: Diagnosis not present

## 2017-01-29 DIAGNOSIS — R229 Localized swelling, mass and lump, unspecified: Secondary | ICD-10-CM | POA: Diagnosis not present

## 2017-02-04 DIAGNOSIS — L603 Nail dystrophy: Secondary | ICD-10-CM | POA: Diagnosis not present

## 2017-02-04 DIAGNOSIS — M2010 Hallux valgus (acquired), unspecified foot: Secondary | ICD-10-CM | POA: Diagnosis not present

## 2017-02-04 DIAGNOSIS — I739 Peripheral vascular disease, unspecified: Secondary | ICD-10-CM | POA: Diagnosis not present

## 2017-02-04 DIAGNOSIS — L853 Xerosis cutis: Secondary | ICD-10-CM | POA: Diagnosis not present

## 2017-02-04 DIAGNOSIS — Q845 Enlarged and hypertrophic nails: Secondary | ICD-10-CM | POA: Diagnosis not present

## 2017-02-04 DIAGNOSIS — R600 Localized edema: Secondary | ICD-10-CM | POA: Diagnosis not present

## 2017-02-04 DIAGNOSIS — B351 Tinea unguium: Secondary | ICD-10-CM | POA: Diagnosis not present

## 2017-02-05 DIAGNOSIS — F0280 Dementia in other diseases classified elsewhere without behavioral disturbance: Secondary | ICD-10-CM | POA: Diagnosis not present

## 2017-02-05 DIAGNOSIS — K119 Disease of salivary gland, unspecified: Secondary | ICD-10-CM | POA: Diagnosis not present

## 2017-02-05 DIAGNOSIS — Z85828 Personal history of other malignant neoplasm of skin: Secondary | ICD-10-CM | POA: Diagnosis not present

## 2017-02-05 DIAGNOSIS — G20 Parkinson's disease: Secondary | ICD-10-CM | POA: Diagnosis not present

## 2017-02-05 DIAGNOSIS — Z993 Dependence on wheelchair: Secondary | ICD-10-CM | POA: Diagnosis not present

## 2017-02-05 DIAGNOSIS — D110 Benign neoplasm of parotid gland: Secondary | ICD-10-CM | POA: Diagnosis not present

## 2017-02-05 DIAGNOSIS — Z87828 Personal history of other (healed) physical injury and trauma: Secondary | ICD-10-CM | POA: Diagnosis not present

## 2017-02-10 ENCOUNTER — Other Ambulatory Visit: Payer: Self-pay | Admitting: Otolaryngology

## 2017-02-10 DIAGNOSIS — K118 Other diseases of salivary glands: Secondary | ICD-10-CM

## 2017-02-18 DIAGNOSIS — E785 Hyperlipidemia, unspecified: Secondary | ICD-10-CM | POA: Diagnosis not present

## 2017-02-18 DIAGNOSIS — D649 Anemia, unspecified: Secondary | ICD-10-CM | POA: Diagnosis not present

## 2017-02-18 DIAGNOSIS — N189 Chronic kidney disease, unspecified: Secondary | ICD-10-CM | POA: Diagnosis not present

## 2017-02-25 DIAGNOSIS — E876 Hypokalemia: Secondary | ICD-10-CM | POA: Diagnosis not present

## 2017-02-25 DIAGNOSIS — Q845 Enlarged and hypertrophic nails: Secondary | ICD-10-CM | POA: Diagnosis not present

## 2017-02-25 DIAGNOSIS — E43 Unspecified severe protein-calorie malnutrition: Secondary | ICD-10-CM | POA: Diagnosis not present

## 2017-02-25 DIAGNOSIS — F330 Major depressive disorder, recurrent, mild: Secondary | ICD-10-CM | POA: Diagnosis not present

## 2017-02-25 DIAGNOSIS — R52 Pain, unspecified: Secondary | ICD-10-CM | POA: Diagnosis not present

## 2017-02-25 DIAGNOSIS — M6281 Muscle weakness (generalized): Secondary | ICD-10-CM | POA: Diagnosis not present

## 2017-02-25 DIAGNOSIS — G20 Parkinson's disease: Secondary | ICD-10-CM | POA: Diagnosis not present

## 2017-02-25 DIAGNOSIS — D649 Anemia, unspecified: Secondary | ICD-10-CM | POA: Diagnosis not present

## 2017-02-25 DIAGNOSIS — B351 Tinea unguium: Secondary | ICD-10-CM | POA: Diagnosis not present

## 2017-02-25 DIAGNOSIS — F0280 Dementia in other diseases classified elsewhere without behavioral disturbance: Secondary | ICD-10-CM | POA: Diagnosis not present

## 2017-02-25 DIAGNOSIS — N189 Chronic kidney disease, unspecified: Secondary | ICD-10-CM | POA: Diagnosis not present

## 2017-02-25 DIAGNOSIS — D110 Benign neoplasm of parotid gland: Secondary | ICD-10-CM | POA: Diagnosis not present

## 2017-02-27 DIAGNOSIS — H6120 Impacted cerumen, unspecified ear: Secondary | ICD-10-CM | POA: Diagnosis not present

## 2017-02-27 DIAGNOSIS — H903 Sensorineural hearing loss, bilateral: Secondary | ICD-10-CM | POA: Diagnosis not present

## 2017-03-03 ENCOUNTER — Ambulatory Visit
Admission: RE | Admit: 2017-03-03 | Discharge: 2017-03-03 | Disposition: A | Discharge status: Home / Self Care | Payer: Medicare Other | Source: Ambulatory Visit | Attending: Otolaryngology | Admitting: Otolaryngology

## 2017-03-03 DIAGNOSIS — R221 Localized swelling, mass and lump, neck: Secondary | ICD-10-CM | POA: Diagnosis not present

## 2017-03-03 DIAGNOSIS — K118 Other diseases of salivary glands: Secondary | ICD-10-CM

## 2017-03-03 MED ORDER — IOPAMIDOL (ISOVUE-300) INJECTION 61%
75.0000 mL | Freq: Once | INTRAVENOUS | Status: AC | PRN
  Administered 2017-03-03: 75 mL via INTRAVENOUS

## 2017-03-10 DIAGNOSIS — W19XXXA Unspecified fall, initial encounter: Secondary | ICD-10-CM | POA: Diagnosis not present

## 2017-03-10 DIAGNOSIS — Z043 Encounter for examination and observation following other accident: Secondary | ICD-10-CM | POA: Diagnosis not present

## 2017-03-11 DIAGNOSIS — Z96641 Presence of right artificial hip joint: Secondary | ICD-10-CM | POA: Diagnosis not present

## 2017-03-11 DIAGNOSIS — G20 Parkinson's disease: Secondary | ICD-10-CM | POA: Diagnosis not present

## 2017-03-11 DIAGNOSIS — R262 Difficulty in walking, not elsewhere classified: Secondary | ICD-10-CM | POA: Diagnosis not present

## 2017-03-11 DIAGNOSIS — R278 Other lack of coordination: Secondary | ICD-10-CM | POA: Diagnosis not present

## 2017-03-19 DIAGNOSIS — R262 Difficulty in walking, not elsewhere classified: Secondary | ICD-10-CM | POA: Diagnosis not present

## 2017-03-19 DIAGNOSIS — G20 Parkinson's disease: Secondary | ICD-10-CM | POA: Diagnosis not present

## 2017-03-19 DIAGNOSIS — R278 Other lack of coordination: Secondary | ICD-10-CM | POA: Diagnosis not present

## 2017-03-19 DIAGNOSIS — Z96641 Presence of right artificial hip joint: Secondary | ICD-10-CM | POA: Diagnosis not present

## 2017-03-20 DIAGNOSIS — F0281 Dementia in other diseases classified elsewhere with behavioral disturbance: Secondary | ICD-10-CM | POA: Diagnosis not present

## 2017-03-20 DIAGNOSIS — G20 Parkinson's disease: Secondary | ICD-10-CM | POA: Diagnosis not present

## 2017-03-20 DIAGNOSIS — Z96641 Presence of right artificial hip joint: Secondary | ICD-10-CM | POA: Diagnosis not present

## 2017-03-20 DIAGNOSIS — R262 Difficulty in walking, not elsewhere classified: Secondary | ICD-10-CM | POA: Diagnosis not present

## 2017-03-20 DIAGNOSIS — R278 Other lack of coordination: Secondary | ICD-10-CM | POA: Diagnosis not present

## 2017-03-20 DIAGNOSIS — G3183 Dementia with Lewy bodies: Secondary | ICD-10-CM | POA: Diagnosis not present

## 2017-03-21 DIAGNOSIS — R262 Difficulty in walking, not elsewhere classified: Secondary | ICD-10-CM | POA: Diagnosis not present

## 2017-03-21 DIAGNOSIS — Z96641 Presence of right artificial hip joint: Secondary | ICD-10-CM | POA: Diagnosis not present

## 2017-03-21 DIAGNOSIS — G20 Parkinson's disease: Secondary | ICD-10-CM | POA: Diagnosis not present

## 2017-03-21 DIAGNOSIS — R278 Other lack of coordination: Secondary | ICD-10-CM | POA: Diagnosis not present

## 2017-03-24 DIAGNOSIS — G20 Parkinson's disease: Secondary | ICD-10-CM | POA: Diagnosis not present

## 2017-03-24 DIAGNOSIS — R278 Other lack of coordination: Secondary | ICD-10-CM | POA: Diagnosis not present

## 2017-03-24 DIAGNOSIS — R262 Difficulty in walking, not elsewhere classified: Secondary | ICD-10-CM | POA: Diagnosis not present

## 2017-03-24 DIAGNOSIS — Z96641 Presence of right artificial hip joint: Secondary | ICD-10-CM | POA: Diagnosis not present

## 2017-03-25 DIAGNOSIS — G20 Parkinson's disease: Secondary | ICD-10-CM | POA: Diagnosis not present

## 2017-03-25 DIAGNOSIS — R262 Difficulty in walking, not elsewhere classified: Secondary | ICD-10-CM | POA: Diagnosis not present

## 2017-03-25 DIAGNOSIS — R278 Other lack of coordination: Secondary | ICD-10-CM | POA: Diagnosis not present

## 2017-03-25 DIAGNOSIS — Z96641 Presence of right artificial hip joint: Secondary | ICD-10-CM | POA: Diagnosis not present

## 2017-03-26 DIAGNOSIS — G20 Parkinson's disease: Secondary | ICD-10-CM | POA: Diagnosis not present

## 2017-03-26 DIAGNOSIS — D649 Anemia, unspecified: Secondary | ICD-10-CM | POA: Diagnosis not present

## 2017-03-26 DIAGNOSIS — E876 Hypokalemia: Secondary | ICD-10-CM | POA: Diagnosis not present

## 2017-03-26 DIAGNOSIS — F0280 Dementia in other diseases classified elsewhere without behavioral disturbance: Secondary | ICD-10-CM | POA: Diagnosis not present

## 2017-03-26 DIAGNOSIS — D110 Benign neoplasm of parotid gland: Secondary | ICD-10-CM | POA: Diagnosis not present

## 2017-03-26 DIAGNOSIS — R262 Difficulty in walking, not elsewhere classified: Secondary | ICD-10-CM | POA: Diagnosis not present

## 2017-03-26 DIAGNOSIS — N189 Chronic kidney disease, unspecified: Secondary | ICD-10-CM | POA: Diagnosis not present

## 2017-03-26 DIAGNOSIS — R278 Other lack of coordination: Secondary | ICD-10-CM | POA: Diagnosis not present

## 2017-03-26 DIAGNOSIS — E43 Unspecified severe protein-calorie malnutrition: Secondary | ICD-10-CM | POA: Diagnosis not present

## 2017-03-26 DIAGNOSIS — R52 Pain, unspecified: Secondary | ICD-10-CM | POA: Diagnosis not present

## 2017-03-26 DIAGNOSIS — F330 Major depressive disorder, recurrent, mild: Secondary | ICD-10-CM | POA: Diagnosis not present

## 2017-03-26 DIAGNOSIS — Q845 Enlarged and hypertrophic nails: Secondary | ICD-10-CM | POA: Diagnosis not present

## 2017-03-26 DIAGNOSIS — B351 Tinea unguium: Secondary | ICD-10-CM | POA: Diagnosis not present

## 2017-03-26 DIAGNOSIS — Z96641 Presence of right artificial hip joint: Secondary | ICD-10-CM | POA: Diagnosis not present

## 2017-03-26 DIAGNOSIS — M6281 Muscle weakness (generalized): Secondary | ICD-10-CM | POA: Diagnosis not present

## 2017-03-27 DIAGNOSIS — R262 Difficulty in walking, not elsewhere classified: Secondary | ICD-10-CM | POA: Diagnosis not present

## 2017-03-27 DIAGNOSIS — R278 Other lack of coordination: Secondary | ICD-10-CM | POA: Diagnosis not present

## 2017-03-27 DIAGNOSIS — Z96641 Presence of right artificial hip joint: Secondary | ICD-10-CM | POA: Diagnosis not present

## 2017-03-27 DIAGNOSIS — G20 Parkinson's disease: Secondary | ICD-10-CM | POA: Diagnosis not present

## 2017-03-28 DIAGNOSIS — G20 Parkinson's disease: Secondary | ICD-10-CM | POA: Diagnosis not present

## 2017-03-28 DIAGNOSIS — Z96641 Presence of right artificial hip joint: Secondary | ICD-10-CM | POA: Diagnosis not present

## 2017-03-28 DIAGNOSIS — R278 Other lack of coordination: Secondary | ICD-10-CM | POA: Diagnosis not present

## 2017-03-28 DIAGNOSIS — R262 Difficulty in walking, not elsewhere classified: Secondary | ICD-10-CM | POA: Diagnosis not present

## 2017-03-31 DIAGNOSIS — R262 Difficulty in walking, not elsewhere classified: Secondary | ICD-10-CM | POA: Diagnosis not present

## 2017-03-31 DIAGNOSIS — G20 Parkinson's disease: Secondary | ICD-10-CM | POA: Diagnosis not present

## 2017-03-31 DIAGNOSIS — Z96641 Presence of right artificial hip joint: Secondary | ICD-10-CM | POA: Diagnosis not present

## 2017-03-31 DIAGNOSIS — R278 Other lack of coordination: Secondary | ICD-10-CM | POA: Diagnosis not present

## 2017-04-01 DIAGNOSIS — G20 Parkinson's disease: Secondary | ICD-10-CM | POA: Diagnosis not present

## 2017-04-01 DIAGNOSIS — R278 Other lack of coordination: Secondary | ICD-10-CM | POA: Diagnosis not present

## 2017-04-01 DIAGNOSIS — R262 Difficulty in walking, not elsewhere classified: Secondary | ICD-10-CM | POA: Diagnosis not present

## 2017-04-01 DIAGNOSIS — Z96641 Presence of right artificial hip joint: Secondary | ICD-10-CM | POA: Diagnosis not present

## 2017-04-02 DIAGNOSIS — R262 Difficulty in walking, not elsewhere classified: Secondary | ICD-10-CM | POA: Diagnosis not present

## 2017-04-02 DIAGNOSIS — R278 Other lack of coordination: Secondary | ICD-10-CM | POA: Diagnosis not present

## 2017-04-02 DIAGNOSIS — G20 Parkinson's disease: Secondary | ICD-10-CM | POA: Diagnosis not present

## 2017-04-02 DIAGNOSIS — Z96641 Presence of right artificial hip joint: Secondary | ICD-10-CM | POA: Diagnosis not present

## 2017-04-03 DIAGNOSIS — Z96641 Presence of right artificial hip joint: Secondary | ICD-10-CM | POA: Diagnosis not present

## 2017-04-03 DIAGNOSIS — R278 Other lack of coordination: Secondary | ICD-10-CM | POA: Diagnosis not present

## 2017-04-03 DIAGNOSIS — R262 Difficulty in walking, not elsewhere classified: Secondary | ICD-10-CM | POA: Diagnosis not present

## 2017-04-03 DIAGNOSIS — G20 Parkinson's disease: Secondary | ICD-10-CM | POA: Diagnosis not present

## 2017-04-04 DIAGNOSIS — Z96641 Presence of right artificial hip joint: Secondary | ICD-10-CM | POA: Diagnosis not present

## 2017-04-04 DIAGNOSIS — G20 Parkinson's disease: Secondary | ICD-10-CM | POA: Diagnosis not present

## 2017-04-04 DIAGNOSIS — R262 Difficulty in walking, not elsewhere classified: Secondary | ICD-10-CM | POA: Diagnosis not present

## 2017-04-04 DIAGNOSIS — R278 Other lack of coordination: Secondary | ICD-10-CM | POA: Diagnosis not present

## 2017-04-07 DIAGNOSIS — G20 Parkinson's disease: Secondary | ICD-10-CM | POA: Diagnosis not present

## 2017-04-07 DIAGNOSIS — R262 Difficulty in walking, not elsewhere classified: Secondary | ICD-10-CM | POA: Diagnosis not present

## 2017-04-07 DIAGNOSIS — Z96641 Presence of right artificial hip joint: Secondary | ICD-10-CM | POA: Diagnosis not present

## 2017-04-07 DIAGNOSIS — R278 Other lack of coordination: Secondary | ICD-10-CM | POA: Diagnosis not present

## 2017-04-08 DIAGNOSIS — G20 Parkinson's disease: Secondary | ICD-10-CM | POA: Diagnosis not present

## 2017-04-08 DIAGNOSIS — R262 Difficulty in walking, not elsewhere classified: Secondary | ICD-10-CM | POA: Diagnosis not present

## 2017-04-08 DIAGNOSIS — Z96641 Presence of right artificial hip joint: Secondary | ICD-10-CM | POA: Diagnosis not present

## 2017-04-08 DIAGNOSIS — I739 Peripheral vascular disease, unspecified: Secondary | ICD-10-CM | POA: Diagnosis not present

## 2017-04-08 DIAGNOSIS — B351 Tinea unguium: Secondary | ICD-10-CM | POA: Diagnosis not present

## 2017-04-08 DIAGNOSIS — Q845 Enlarged and hypertrophic nails: Secondary | ICD-10-CM | POA: Diagnosis not present

## 2017-04-08 DIAGNOSIS — L603 Nail dystrophy: Secondary | ICD-10-CM | POA: Diagnosis not present

## 2017-04-08 DIAGNOSIS — R278 Other lack of coordination: Secondary | ICD-10-CM | POA: Diagnosis not present

## 2017-04-09 DIAGNOSIS — Z96641 Presence of right artificial hip joint: Secondary | ICD-10-CM | POA: Diagnosis not present

## 2017-04-09 DIAGNOSIS — R278 Other lack of coordination: Secondary | ICD-10-CM | POA: Diagnosis not present

## 2017-04-09 DIAGNOSIS — R262 Difficulty in walking, not elsewhere classified: Secondary | ICD-10-CM | POA: Diagnosis not present

## 2017-04-09 DIAGNOSIS — G20 Parkinson's disease: Secondary | ICD-10-CM | POA: Diagnosis not present

## 2017-04-10 DIAGNOSIS — G20 Parkinson's disease: Secondary | ICD-10-CM | POA: Diagnosis not present

## 2017-04-10 DIAGNOSIS — R262 Difficulty in walking, not elsewhere classified: Secondary | ICD-10-CM | POA: Diagnosis not present

## 2017-04-10 DIAGNOSIS — R278 Other lack of coordination: Secondary | ICD-10-CM | POA: Diagnosis not present

## 2017-04-10 DIAGNOSIS — Z96641 Presence of right artificial hip joint: Secondary | ICD-10-CM | POA: Diagnosis not present

## 2017-04-11 DIAGNOSIS — G20 Parkinson's disease: Secondary | ICD-10-CM | POA: Diagnosis not present

## 2017-04-11 DIAGNOSIS — R262 Difficulty in walking, not elsewhere classified: Secondary | ICD-10-CM | POA: Diagnosis not present

## 2017-04-11 DIAGNOSIS — R278 Other lack of coordination: Secondary | ICD-10-CM | POA: Diagnosis not present

## 2017-04-11 DIAGNOSIS — Z96641 Presence of right artificial hip joint: Secondary | ICD-10-CM | POA: Diagnosis not present

## 2017-04-14 DIAGNOSIS — G20 Parkinson's disease: Secondary | ICD-10-CM | POA: Diagnosis not present

## 2017-04-14 DIAGNOSIS — Z96641 Presence of right artificial hip joint: Secondary | ICD-10-CM | POA: Diagnosis not present

## 2017-04-14 DIAGNOSIS — R278 Other lack of coordination: Secondary | ICD-10-CM | POA: Diagnosis not present

## 2017-04-14 DIAGNOSIS — R262 Difficulty in walking, not elsewhere classified: Secondary | ICD-10-CM | POA: Diagnosis not present

## 2017-04-15 DIAGNOSIS — R262 Difficulty in walking, not elsewhere classified: Secondary | ICD-10-CM | POA: Diagnosis not present

## 2017-04-15 DIAGNOSIS — Z96641 Presence of right artificial hip joint: Secondary | ICD-10-CM | POA: Diagnosis not present

## 2017-04-15 DIAGNOSIS — R278 Other lack of coordination: Secondary | ICD-10-CM | POA: Diagnosis not present

## 2017-04-15 DIAGNOSIS — G20 Parkinson's disease: Secondary | ICD-10-CM | POA: Diagnosis not present

## 2017-04-16 DIAGNOSIS — F5105 Insomnia due to other mental disorder: Secondary | ICD-10-CM | POA: Diagnosis not present

## 2017-04-16 DIAGNOSIS — G3183 Dementia with Lewy bodies: Secondary | ICD-10-CM | POA: Diagnosis not present

## 2017-04-16 DIAGNOSIS — F331 Major depressive disorder, recurrent, moderate: Secondary | ICD-10-CM | POA: Diagnosis not present

## 2017-04-16 DIAGNOSIS — F0281 Dementia in other diseases classified elsewhere with behavioral disturbance: Secondary | ICD-10-CM | POA: Diagnosis not present

## 2017-04-28 DIAGNOSIS — R600 Localized edema: Secondary | ICD-10-CM | POA: Diagnosis not present

## 2017-04-28 DIAGNOSIS — M25572 Pain in left ankle and joints of left foot: Secondary | ICD-10-CM | POA: Diagnosis not present

## 2017-04-29 DIAGNOSIS — G20 Parkinson's disease: Secondary | ICD-10-CM | POA: Diagnosis not present

## 2017-04-29 DIAGNOSIS — E43 Unspecified severe protein-calorie malnutrition: Secondary | ICD-10-CM | POA: Diagnosis not present

## 2017-04-29 DIAGNOSIS — M6281 Muscle weakness (generalized): Secondary | ICD-10-CM | POA: Diagnosis not present

## 2017-04-29 DIAGNOSIS — E876 Hypokalemia: Secondary | ICD-10-CM | POA: Diagnosis not present

## 2017-04-29 DIAGNOSIS — D110 Benign neoplasm of parotid gland: Secondary | ICD-10-CM | POA: Diagnosis not present

## 2017-04-29 DIAGNOSIS — B351 Tinea unguium: Secondary | ICD-10-CM | POA: Diagnosis not present

## 2017-04-29 DIAGNOSIS — R52 Pain, unspecified: Secondary | ICD-10-CM | POA: Diagnosis not present

## 2017-04-29 DIAGNOSIS — Q845 Enlarged and hypertrophic nails: Secondary | ICD-10-CM | POA: Diagnosis not present

## 2017-04-29 DIAGNOSIS — F0280 Dementia in other diseases classified elsewhere without behavioral disturbance: Secondary | ICD-10-CM | POA: Diagnosis not present

## 2017-04-29 DIAGNOSIS — N189 Chronic kidney disease, unspecified: Secondary | ICD-10-CM | POA: Diagnosis not present

## 2017-04-29 DIAGNOSIS — F330 Major depressive disorder, recurrent, mild: Secondary | ICD-10-CM | POA: Diagnosis not present

## 2017-04-29 DIAGNOSIS — D649 Anemia, unspecified: Secondary | ICD-10-CM | POA: Diagnosis not present

## 2017-05-02 DIAGNOSIS — R52 Pain, unspecified: Secondary | ICD-10-CM | POA: Diagnosis not present

## 2017-05-02 DIAGNOSIS — B351 Tinea unguium: Secondary | ICD-10-CM | POA: Diagnosis not present

## 2017-05-02 DIAGNOSIS — D110 Benign neoplasm of parotid gland: Secondary | ICD-10-CM | POA: Diagnosis not present

## 2017-05-02 DIAGNOSIS — E43 Unspecified severe protein-calorie malnutrition: Secondary | ICD-10-CM | POA: Diagnosis not present

## 2017-05-02 DIAGNOSIS — F0280 Dementia in other diseases classified elsewhere without behavioral disturbance: Secondary | ICD-10-CM | POA: Diagnosis not present

## 2017-05-02 DIAGNOSIS — F330 Major depressive disorder, recurrent, mild: Secondary | ICD-10-CM | POA: Diagnosis not present

## 2017-05-02 DIAGNOSIS — M6281 Muscle weakness (generalized): Secondary | ICD-10-CM | POA: Diagnosis not present

## 2017-05-02 DIAGNOSIS — E876 Hypokalemia: Secondary | ICD-10-CM | POA: Diagnosis not present

## 2017-05-02 DIAGNOSIS — Q845 Enlarged and hypertrophic nails: Secondary | ICD-10-CM | POA: Diagnosis not present

## 2017-05-02 DIAGNOSIS — N189 Chronic kidney disease, unspecified: Secondary | ICD-10-CM | POA: Diagnosis not present

## 2017-05-02 DIAGNOSIS — D649 Anemia, unspecified: Secondary | ICD-10-CM | POA: Diagnosis not present

## 2017-05-02 DIAGNOSIS — G20 Parkinson's disease: Secondary | ICD-10-CM | POA: Diagnosis not present

## 2017-06-04 DIAGNOSIS — F0281 Dementia in other diseases classified elsewhere with behavioral disturbance: Secondary | ICD-10-CM | POA: Diagnosis not present

## 2017-06-04 DIAGNOSIS — G3183 Dementia with Lewy bodies: Secondary | ICD-10-CM | POA: Diagnosis not present

## 2017-06-04 DIAGNOSIS — F5105 Insomnia due to other mental disorder: Secondary | ICD-10-CM | POA: Diagnosis not present

## 2017-06-04 DIAGNOSIS — F331 Major depressive disorder, recurrent, moderate: Secondary | ICD-10-CM | POA: Diagnosis not present

## 2017-06-05 DIAGNOSIS — G20 Parkinson's disease: Secondary | ICD-10-CM | POA: Diagnosis not present

## 2017-06-05 DIAGNOSIS — F330 Major depressive disorder, recurrent, mild: Secondary | ICD-10-CM | POA: Diagnosis not present

## 2017-06-05 DIAGNOSIS — D649 Anemia, unspecified: Secondary | ICD-10-CM | POA: Diagnosis not present

## 2017-06-05 DIAGNOSIS — N189 Chronic kidney disease, unspecified: Secondary | ICD-10-CM | POA: Diagnosis not present

## 2017-06-05 DIAGNOSIS — M6281 Muscle weakness (generalized): Secondary | ICD-10-CM | POA: Diagnosis not present

## 2017-06-05 DIAGNOSIS — R52 Pain, unspecified: Secondary | ICD-10-CM | POA: Diagnosis not present

## 2017-06-05 DIAGNOSIS — F0280 Dementia in other diseases classified elsewhere without behavioral disturbance: Secondary | ICD-10-CM | POA: Diagnosis not present

## 2017-06-05 DIAGNOSIS — G4700 Insomnia, unspecified: Secondary | ICD-10-CM | POA: Diagnosis not present

## 2017-06-05 DIAGNOSIS — E43 Unspecified severe protein-calorie malnutrition: Secondary | ICD-10-CM | POA: Diagnosis not present

## 2017-06-05 DIAGNOSIS — D110 Benign neoplasm of parotid gland: Secondary | ICD-10-CM | POA: Diagnosis not present

## 2017-06-05 DIAGNOSIS — E876 Hypokalemia: Secondary | ICD-10-CM | POA: Diagnosis not present

## 2017-07-02 DIAGNOSIS — G3183 Dementia with Lewy bodies: Secondary | ICD-10-CM | POA: Diagnosis not present

## 2017-07-02 DIAGNOSIS — F331 Major depressive disorder, recurrent, moderate: Secondary | ICD-10-CM | POA: Diagnosis not present

## 2017-07-02 DIAGNOSIS — F5105 Insomnia due to other mental disorder: Secondary | ICD-10-CM | POA: Diagnosis not present

## 2017-07-02 DIAGNOSIS — F0281 Dementia in other diseases classified elsewhere with behavioral disturbance: Secondary | ICD-10-CM | POA: Diagnosis not present

## 2017-07-09 DIAGNOSIS — M6281 Muscle weakness (generalized): Secondary | ICD-10-CM | POA: Diagnosis not present

## 2017-07-09 DIAGNOSIS — F0280 Dementia in other diseases classified elsewhere without behavioral disturbance: Secondary | ICD-10-CM | POA: Diagnosis not present

## 2017-07-09 DIAGNOSIS — D110 Benign neoplasm of parotid gland: Secondary | ICD-10-CM | POA: Diagnosis not present

## 2017-07-09 DIAGNOSIS — E43 Unspecified severe protein-calorie malnutrition: Secondary | ICD-10-CM | POA: Diagnosis not present

## 2017-07-09 DIAGNOSIS — D649 Anemia, unspecified: Secondary | ICD-10-CM | POA: Diagnosis not present

## 2017-07-09 DIAGNOSIS — G4700 Insomnia, unspecified: Secondary | ICD-10-CM | POA: Diagnosis not present

## 2017-07-09 DIAGNOSIS — N189 Chronic kidney disease, unspecified: Secondary | ICD-10-CM | POA: Diagnosis not present

## 2017-07-09 DIAGNOSIS — E876 Hypokalemia: Secondary | ICD-10-CM | POA: Diagnosis not present

## 2017-07-09 DIAGNOSIS — R52 Pain, unspecified: Secondary | ICD-10-CM | POA: Diagnosis not present

## 2017-07-09 DIAGNOSIS — F330 Major depressive disorder, recurrent, mild: Secondary | ICD-10-CM | POA: Diagnosis not present

## 2017-07-09 DIAGNOSIS — G20 Parkinson's disease: Secondary | ICD-10-CM | POA: Diagnosis not present

## 2017-07-17 DIAGNOSIS — B351 Tinea unguium: Secondary | ICD-10-CM | POA: Diagnosis not present

## 2017-07-17 DIAGNOSIS — Q845 Enlarged and hypertrophic nails: Secondary | ICD-10-CM | POA: Diagnosis not present

## 2017-07-17 DIAGNOSIS — L603 Nail dystrophy: Secondary | ICD-10-CM | POA: Diagnosis not present

## 2017-07-17 DIAGNOSIS — I739 Peripheral vascular disease, unspecified: Secondary | ICD-10-CM | POA: Diagnosis not present

## 2017-07-21 DIAGNOSIS — N189 Chronic kidney disease, unspecified: Secondary | ICD-10-CM | POA: Diagnosis not present

## 2017-07-21 DIAGNOSIS — E43 Unspecified severe protein-calorie malnutrition: Secondary | ICD-10-CM | POA: Diagnosis not present

## 2017-07-30 DIAGNOSIS — G3183 Dementia with Lewy bodies: Secondary | ICD-10-CM | POA: Diagnosis not present

## 2017-07-30 DIAGNOSIS — F0281 Dementia in other diseases classified elsewhere with behavioral disturbance: Secondary | ICD-10-CM | POA: Diagnosis not present

## 2017-07-30 DIAGNOSIS — F5105 Insomnia due to other mental disorder: Secondary | ICD-10-CM | POA: Diagnosis not present

## 2017-07-30 DIAGNOSIS — F331 Major depressive disorder, recurrent, moderate: Secondary | ICD-10-CM | POA: Diagnosis not present

## 2017-08-08 DIAGNOSIS — M6281 Muscle weakness (generalized): Secondary | ICD-10-CM | POA: Diagnosis not present

## 2017-08-08 DIAGNOSIS — E43 Unspecified severe protein-calorie malnutrition: Secondary | ICD-10-CM | POA: Diagnosis not present

## 2017-08-08 DIAGNOSIS — F330 Major depressive disorder, recurrent, mild: Secondary | ICD-10-CM | POA: Diagnosis not present

## 2017-08-08 DIAGNOSIS — D110 Benign neoplasm of parotid gland: Secondary | ICD-10-CM | POA: Diagnosis not present

## 2017-08-08 DIAGNOSIS — E876 Hypokalemia: Secondary | ICD-10-CM | POA: Diagnosis not present

## 2017-08-08 DIAGNOSIS — R52 Pain, unspecified: Secondary | ICD-10-CM | POA: Diagnosis not present

## 2017-08-08 DIAGNOSIS — G20 Parkinson's disease: Secondary | ICD-10-CM | POA: Diagnosis not present

## 2017-08-08 DIAGNOSIS — F0280 Dementia in other diseases classified elsewhere without behavioral disturbance: Secondary | ICD-10-CM | POA: Diagnosis not present

## 2017-08-08 DIAGNOSIS — D649 Anemia, unspecified: Secondary | ICD-10-CM | POA: Diagnosis not present

## 2017-08-08 DIAGNOSIS — G4700 Insomnia, unspecified: Secondary | ICD-10-CM | POA: Diagnosis not present

## 2017-08-08 DIAGNOSIS — N189 Chronic kidney disease, unspecified: Secondary | ICD-10-CM | POA: Diagnosis not present

## 2017-08-09 DIAGNOSIS — D649 Anemia, unspecified: Secondary | ICD-10-CM | POA: Diagnosis not present

## 2017-08-09 DIAGNOSIS — E876 Hypokalemia: Secondary | ICD-10-CM | POA: Diagnosis not present

## 2017-08-12 DIAGNOSIS — E876 Hypokalemia: Secondary | ICD-10-CM | POA: Diagnosis not present

## 2017-08-21 DIAGNOSIS — E876 Hypokalemia: Secondary | ICD-10-CM | POA: Diagnosis not present

## 2017-08-21 DIAGNOSIS — D649 Anemia, unspecified: Secondary | ICD-10-CM | POA: Diagnosis not present

## 2017-08-27 DIAGNOSIS — F5105 Insomnia due to other mental disorder: Secondary | ICD-10-CM | POA: Diagnosis not present

## 2017-08-27 DIAGNOSIS — G3183 Dementia with Lewy bodies: Secondary | ICD-10-CM | POA: Diagnosis not present

## 2017-08-27 DIAGNOSIS — F0281 Dementia in other diseases classified elsewhere with behavioral disturbance: Secondary | ICD-10-CM | POA: Diagnosis not present

## 2017-08-27 DIAGNOSIS — F331 Major depressive disorder, recurrent, moderate: Secondary | ICD-10-CM | POA: Diagnosis not present

## 2017-09-04 DIAGNOSIS — B351 Tinea unguium: Secondary | ICD-10-CM | POA: Diagnosis not present

## 2017-09-04 DIAGNOSIS — G4700 Insomnia, unspecified: Secondary | ICD-10-CM | POA: Diagnosis not present

## 2017-09-04 DIAGNOSIS — R54 Age-related physical debility: Secondary | ICD-10-CM | POA: Diagnosis not present

## 2017-09-04 DIAGNOSIS — N189 Chronic kidney disease, unspecified: Secondary | ICD-10-CM | POA: Diagnosis not present

## 2017-09-04 DIAGNOSIS — R609 Edema, unspecified: Secondary | ICD-10-CM | POA: Diagnosis not present

## 2017-09-04 DIAGNOSIS — F331 Major depressive disorder, recurrent, moderate: Secondary | ICD-10-CM | POA: Diagnosis not present

## 2017-09-04 DIAGNOSIS — F330 Major depressive disorder, recurrent, mild: Secondary | ICD-10-CM | POA: Diagnosis not present

## 2017-09-04 DIAGNOSIS — E43 Unspecified severe protein-calorie malnutrition: Secondary | ICD-10-CM | POA: Diagnosis not present

## 2017-09-04 DIAGNOSIS — G3183 Dementia with Lewy bodies: Secondary | ICD-10-CM | POA: Diagnosis not present

## 2017-09-04 DIAGNOSIS — F5105 Insomnia due to other mental disorder: Secondary | ICD-10-CM | POA: Diagnosis not present

## 2017-09-04 DIAGNOSIS — M6281 Muscle weakness (generalized): Secondary | ICD-10-CM | POA: Diagnosis not present

## 2017-09-04 DIAGNOSIS — D649 Anemia, unspecified: Secondary | ICD-10-CM | POA: Diagnosis not present

## 2017-09-11 DIAGNOSIS — D110 Benign neoplasm of parotid gland: Secondary | ICD-10-CM | POA: Diagnosis not present

## 2017-09-11 DIAGNOSIS — F330 Major depressive disorder, recurrent, mild: Secondary | ICD-10-CM | POA: Diagnosis not present

## 2017-09-11 DIAGNOSIS — F0280 Dementia in other diseases classified elsewhere without behavioral disturbance: Secondary | ICD-10-CM | POA: Diagnosis not present

## 2017-09-11 DIAGNOSIS — G4700 Insomnia, unspecified: Secondary | ICD-10-CM | POA: Diagnosis not present

## 2017-09-11 DIAGNOSIS — D649 Anemia, unspecified: Secondary | ICD-10-CM | POA: Diagnosis not present

## 2017-09-11 DIAGNOSIS — N189 Chronic kidney disease, unspecified: Secondary | ICD-10-CM | POA: Diagnosis not present

## 2017-09-11 DIAGNOSIS — M6281 Muscle weakness (generalized): Secondary | ICD-10-CM | POA: Diagnosis not present

## 2017-09-11 DIAGNOSIS — E876 Hypokalemia: Secondary | ICD-10-CM | POA: Diagnosis not present

## 2017-09-11 DIAGNOSIS — G20 Parkinson's disease: Secondary | ICD-10-CM | POA: Diagnosis not present

## 2017-09-11 DIAGNOSIS — R52 Pain, unspecified: Secondary | ICD-10-CM | POA: Diagnosis not present

## 2017-09-17 DIAGNOSIS — B351 Tinea unguium: Secondary | ICD-10-CM | POA: Diagnosis not present

## 2017-09-17 DIAGNOSIS — L603 Nail dystrophy: Secondary | ICD-10-CM | POA: Diagnosis not present

## 2017-09-17 DIAGNOSIS — I739 Peripheral vascular disease, unspecified: Secondary | ICD-10-CM | POA: Diagnosis not present

## 2017-09-17 DIAGNOSIS — Q845 Enlarged and hypertrophic nails: Secondary | ICD-10-CM | POA: Diagnosis not present

## 2017-09-24 DIAGNOSIS — F331 Major depressive disorder, recurrent, moderate: Secondary | ICD-10-CM | POA: Diagnosis not present

## 2017-09-24 DIAGNOSIS — F5105 Insomnia due to other mental disorder: Secondary | ICD-10-CM | POA: Diagnosis not present

## 2017-09-24 DIAGNOSIS — F0281 Dementia in other diseases classified elsewhere with behavioral disturbance: Secondary | ICD-10-CM | POA: Diagnosis not present

## 2017-09-24 DIAGNOSIS — G3183 Dementia with Lewy bodies: Secondary | ICD-10-CM | POA: Diagnosis not present

## 2017-09-29 DIAGNOSIS — E876 Hypokalemia: Secondary | ICD-10-CM | POA: Diagnosis not present

## 2017-09-29 DIAGNOSIS — D110 Benign neoplasm of parotid gland: Secondary | ICD-10-CM | POA: Diagnosis not present

## 2017-09-29 DIAGNOSIS — M6281 Muscle weakness (generalized): Secondary | ICD-10-CM | POA: Diagnosis not present

## 2017-09-29 DIAGNOSIS — R52 Pain, unspecified: Secondary | ICD-10-CM | POA: Diagnosis not present

## 2017-09-29 DIAGNOSIS — F0280 Dementia in other diseases classified elsewhere without behavioral disturbance: Secondary | ICD-10-CM | POA: Diagnosis not present

## 2017-09-29 DIAGNOSIS — Z7409 Other reduced mobility: Secondary | ICD-10-CM | POA: Diagnosis not present

## 2017-09-29 DIAGNOSIS — G20 Parkinson's disease: Secondary | ICD-10-CM | POA: Diagnosis not present

## 2017-09-29 DIAGNOSIS — F330 Major depressive disorder, recurrent, mild: Secondary | ICD-10-CM | POA: Diagnosis not present

## 2017-09-29 DIAGNOSIS — D649 Anemia, unspecified: Secondary | ICD-10-CM | POA: Diagnosis not present

## 2017-09-29 DIAGNOSIS — E441 Mild protein-calorie malnutrition: Secondary | ICD-10-CM | POA: Diagnosis not present

## 2017-09-29 DIAGNOSIS — N189 Chronic kidney disease, unspecified: Secondary | ICD-10-CM | POA: Diagnosis not present

## 2017-09-29 DIAGNOSIS — R251 Tremor, unspecified: Secondary | ICD-10-CM | POA: Diagnosis not present

## 2017-09-30 DIAGNOSIS — E876 Hypokalemia: Secondary | ICD-10-CM | POA: Diagnosis not present

## 2017-10-06 DIAGNOSIS — N189 Chronic kidney disease, unspecified: Secondary | ICD-10-CM | POA: Diagnosis not present

## 2017-10-06 DIAGNOSIS — F331 Major depressive disorder, recurrent, moderate: Secondary | ICD-10-CM | POA: Diagnosis not present

## 2017-10-07 DIAGNOSIS — D649 Anemia, unspecified: Secondary | ICD-10-CM | POA: Diagnosis not present

## 2017-10-07 DIAGNOSIS — Z79899 Other long term (current) drug therapy: Secondary | ICD-10-CM | POA: Diagnosis not present

## 2017-10-08 DIAGNOSIS — E876 Hypokalemia: Secondary | ICD-10-CM | POA: Diagnosis not present

## 2017-10-13 DIAGNOSIS — D649 Anemia, unspecified: Secondary | ICD-10-CM | POA: Diagnosis not present

## 2017-10-13 DIAGNOSIS — Z79899 Other long term (current) drug therapy: Secondary | ICD-10-CM | POA: Diagnosis not present

## 2017-10-29 DIAGNOSIS — G4701 Insomnia due to medical condition: Secondary | ICD-10-CM | POA: Diagnosis not present

## 2017-10-29 DIAGNOSIS — F331 Major depressive disorder, recurrent, moderate: Secondary | ICD-10-CM | POA: Diagnosis not present

## 2017-10-29 DIAGNOSIS — F0281 Dementia in other diseases classified elsewhere with behavioral disturbance: Secondary | ICD-10-CM | POA: Diagnosis not present

## 2017-10-29 DIAGNOSIS — G3183 Dementia with Lewy bodies: Secondary | ICD-10-CM | POA: Diagnosis not present

## 2017-11-06 DIAGNOSIS — F331 Major depressive disorder, recurrent, moderate: Secondary | ICD-10-CM | POA: Diagnosis not present

## 2017-11-06 DIAGNOSIS — N189 Chronic kidney disease, unspecified: Secondary | ICD-10-CM | POA: Diagnosis not present

## 2017-11-08 DIAGNOSIS — D649 Anemia, unspecified: Secondary | ICD-10-CM | POA: Diagnosis not present

## 2017-11-08 DIAGNOSIS — G309 Alzheimer's disease, unspecified: Secondary | ICD-10-CM | POA: Diagnosis not present

## 2017-11-19 DIAGNOSIS — Q845 Enlarged and hypertrophic nails: Secondary | ICD-10-CM | POA: Diagnosis not present

## 2017-11-19 DIAGNOSIS — F0281 Dementia in other diseases classified elsewhere with behavioral disturbance: Secondary | ICD-10-CM | POA: Diagnosis not present

## 2017-11-19 DIAGNOSIS — G4701 Insomnia due to medical condition: Secondary | ICD-10-CM | POA: Diagnosis not present

## 2017-11-19 DIAGNOSIS — G3183 Dementia with Lewy bodies: Secondary | ICD-10-CM | POA: Diagnosis not present

## 2017-11-19 DIAGNOSIS — L603 Nail dystrophy: Secondary | ICD-10-CM | POA: Diagnosis not present

## 2017-11-19 DIAGNOSIS — F331 Major depressive disorder, recurrent, moderate: Secondary | ICD-10-CM | POA: Diagnosis not present

## 2017-11-19 DIAGNOSIS — B351 Tinea unguium: Secondary | ICD-10-CM | POA: Diagnosis not present

## 2017-11-19 DIAGNOSIS — I739 Peripheral vascular disease, unspecified: Secondary | ICD-10-CM | POA: Diagnosis not present

## 2017-12-17 DIAGNOSIS — G4701 Insomnia due to medical condition: Secondary | ICD-10-CM | POA: Diagnosis not present

## 2017-12-17 DIAGNOSIS — F0281 Dementia in other diseases classified elsewhere with behavioral disturbance: Secondary | ICD-10-CM | POA: Diagnosis not present

## 2017-12-17 DIAGNOSIS — F331 Major depressive disorder, recurrent, moderate: Secondary | ICD-10-CM | POA: Diagnosis not present

## 2017-12-17 DIAGNOSIS — G3183 Dementia with Lewy bodies: Secondary | ICD-10-CM | POA: Diagnosis not present

## 2018-01-03 DIAGNOSIS — R0902 Hypoxemia: Secondary | ICD-10-CM | POA: Diagnosis not present

## 2018-02-23 IMAGING — CR DG HIP (WITH OR WITHOUT PELVIS) 2-3V*R*
3 series · 3 of 3 positions shown · non-contrast
Comparison: Abdominal radiograph dated 11/27/2008

CLINICAL DATA: [AGE] female with fall and right hip pain.

EXAM:
DG HIP (WITH OR WITHOUT PELVIS) 2-3V RIGHT

[x pelvis]
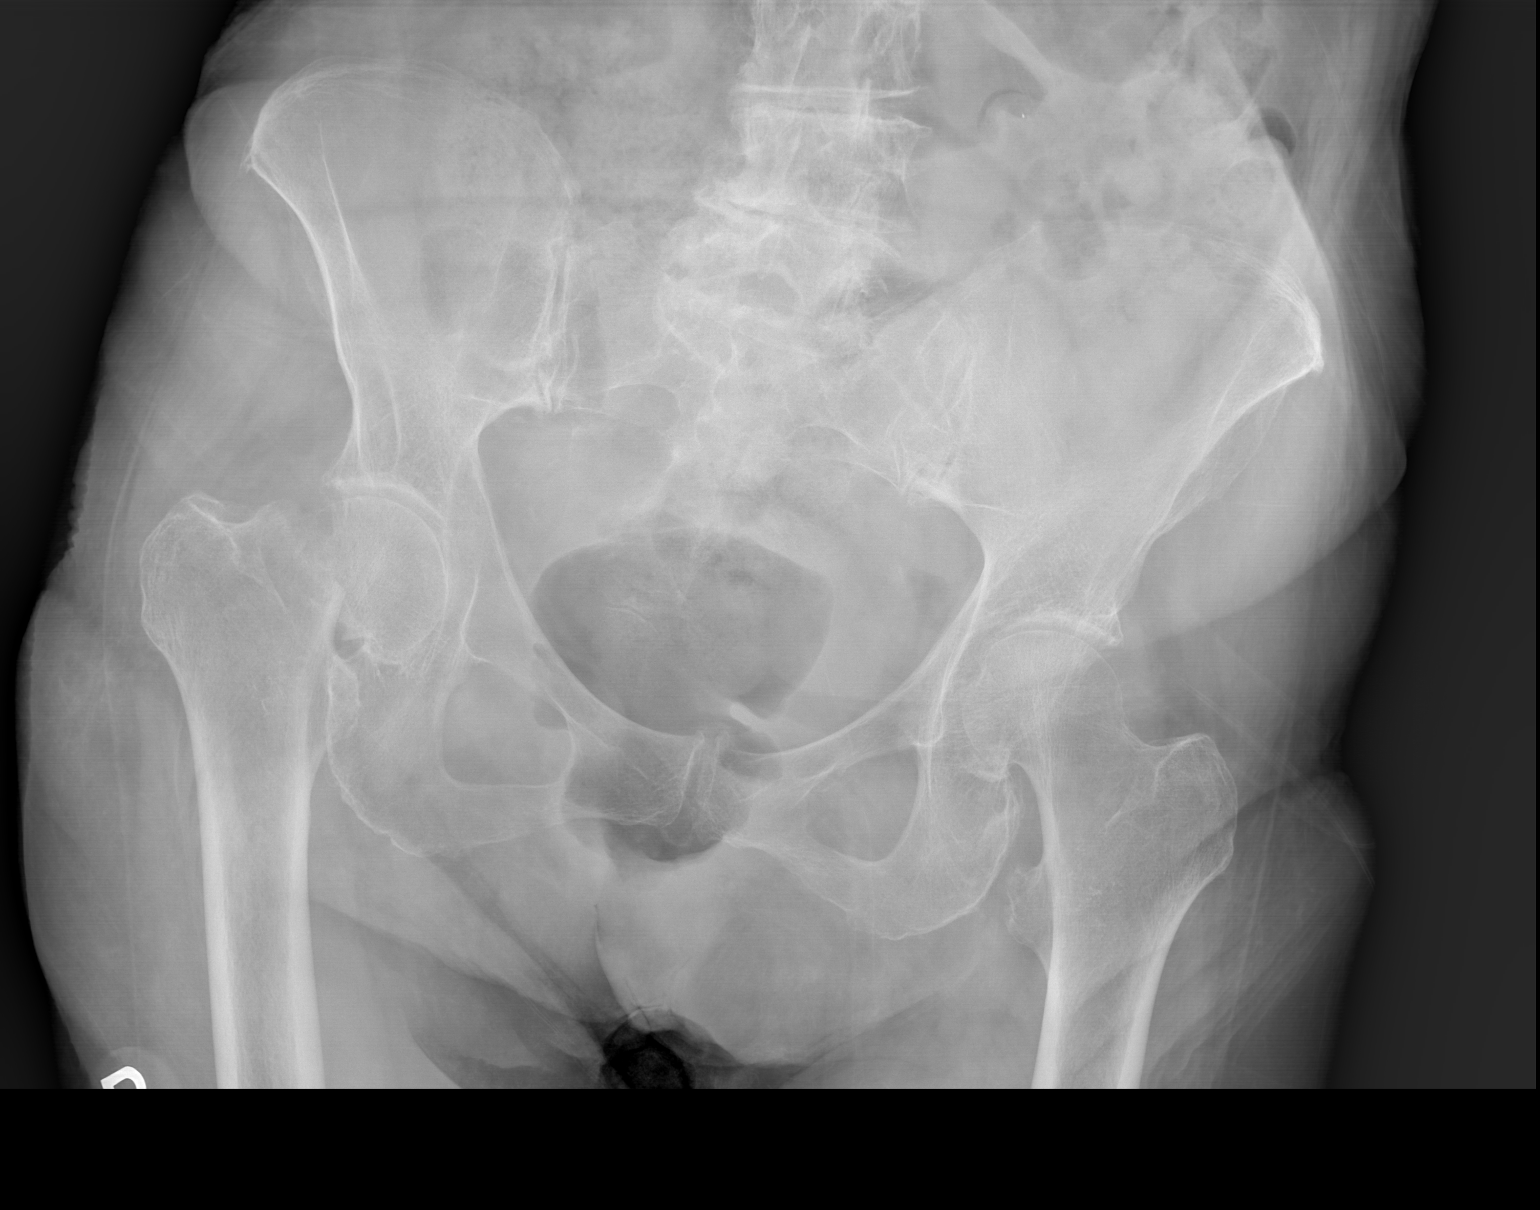

[x hip ap right]
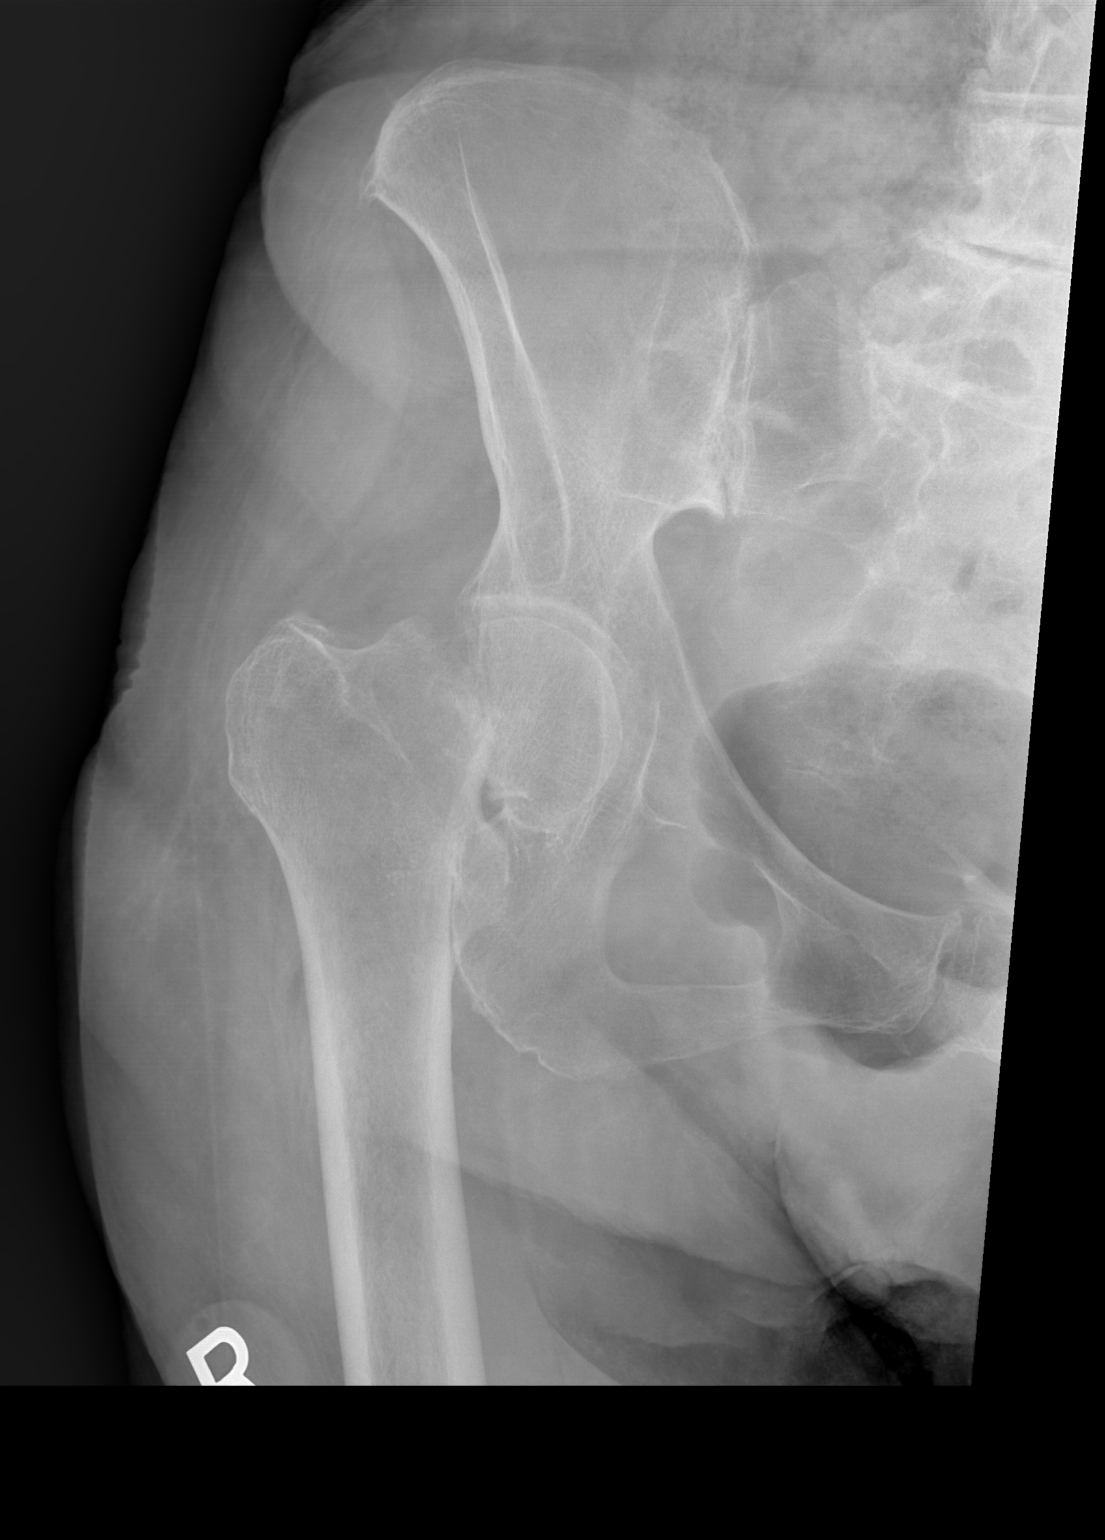

[w hip lat right]
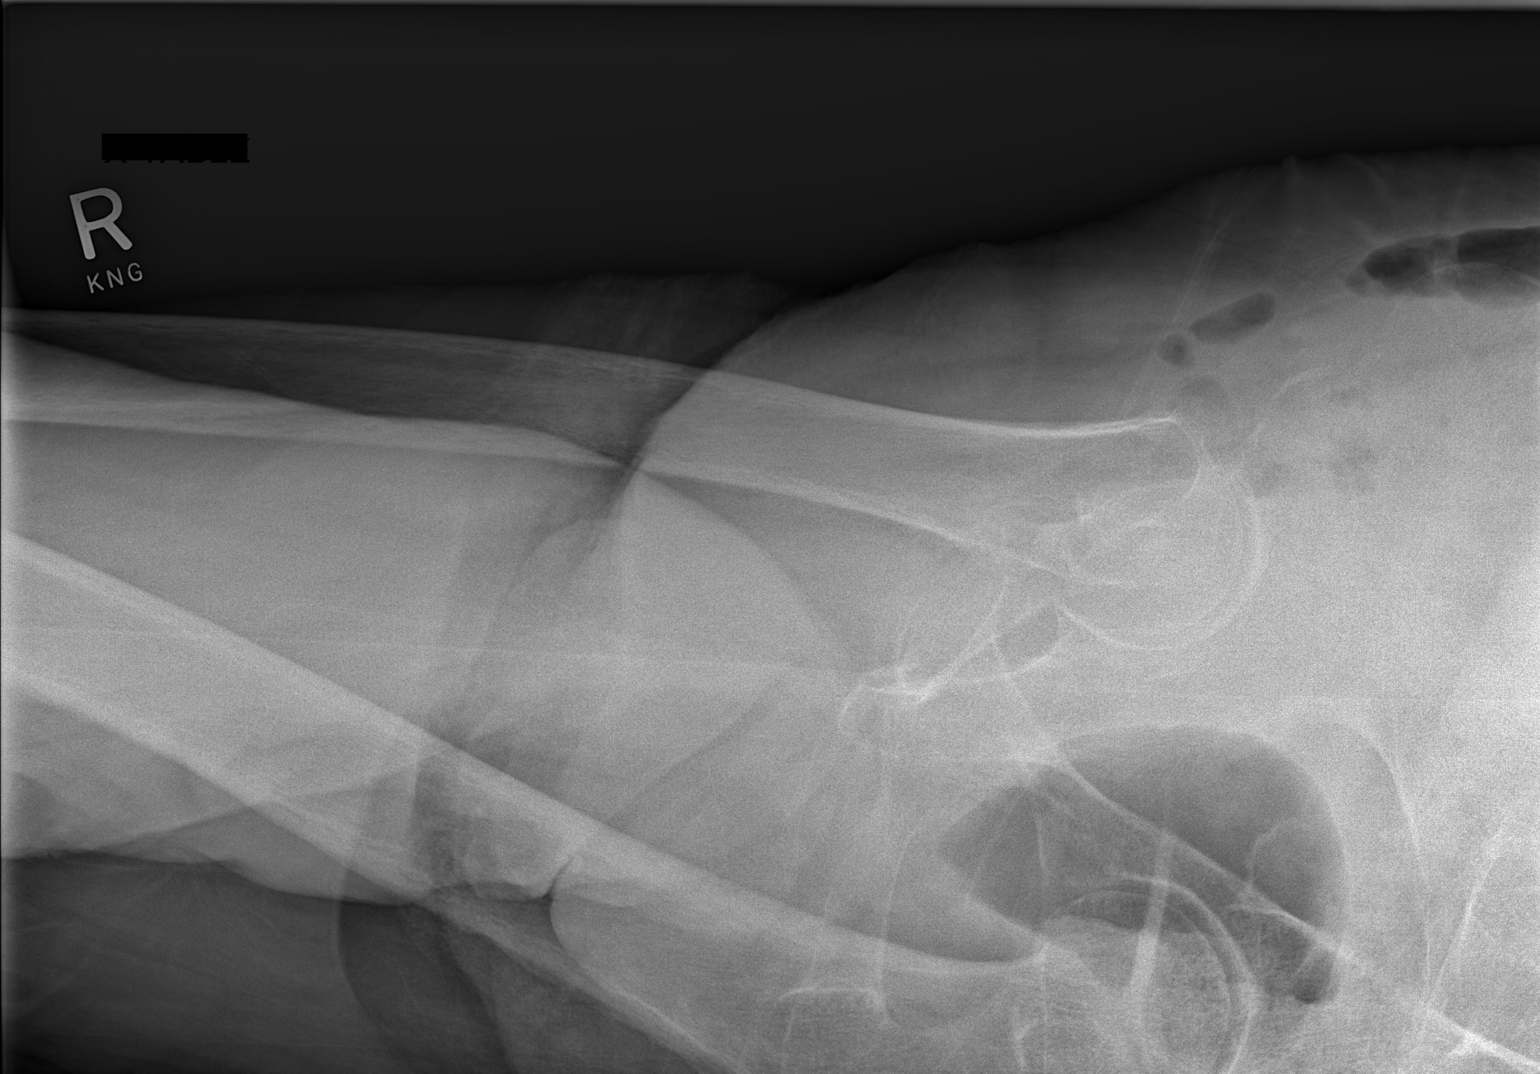

[3 of 3 positions shown; findings below may reference images not displayed]

FINDINGS: There is a mildly displaced fracture of the right femoral neck with
mild proximal migration of the femoral shaft in relation to the head
of the femur. The femoral head remains in anatomic alignment with
the acetabulum. There is no dislocation. The bones are osteopenic.
The soft tissues appear unremarkable.
IMPRESSION: Mildly displaced fracture of the right femoral neck.

## 2018-02-23 IMAGING — CR DG CHEST 1V
1 series · 1 of 1 positions shown · non-contrast
Comparison: None.

CLINICAL DATA: [AGE] female with fall and right hip injury.

EXAM:
CHEST 1 VIEW

[x chest ap]
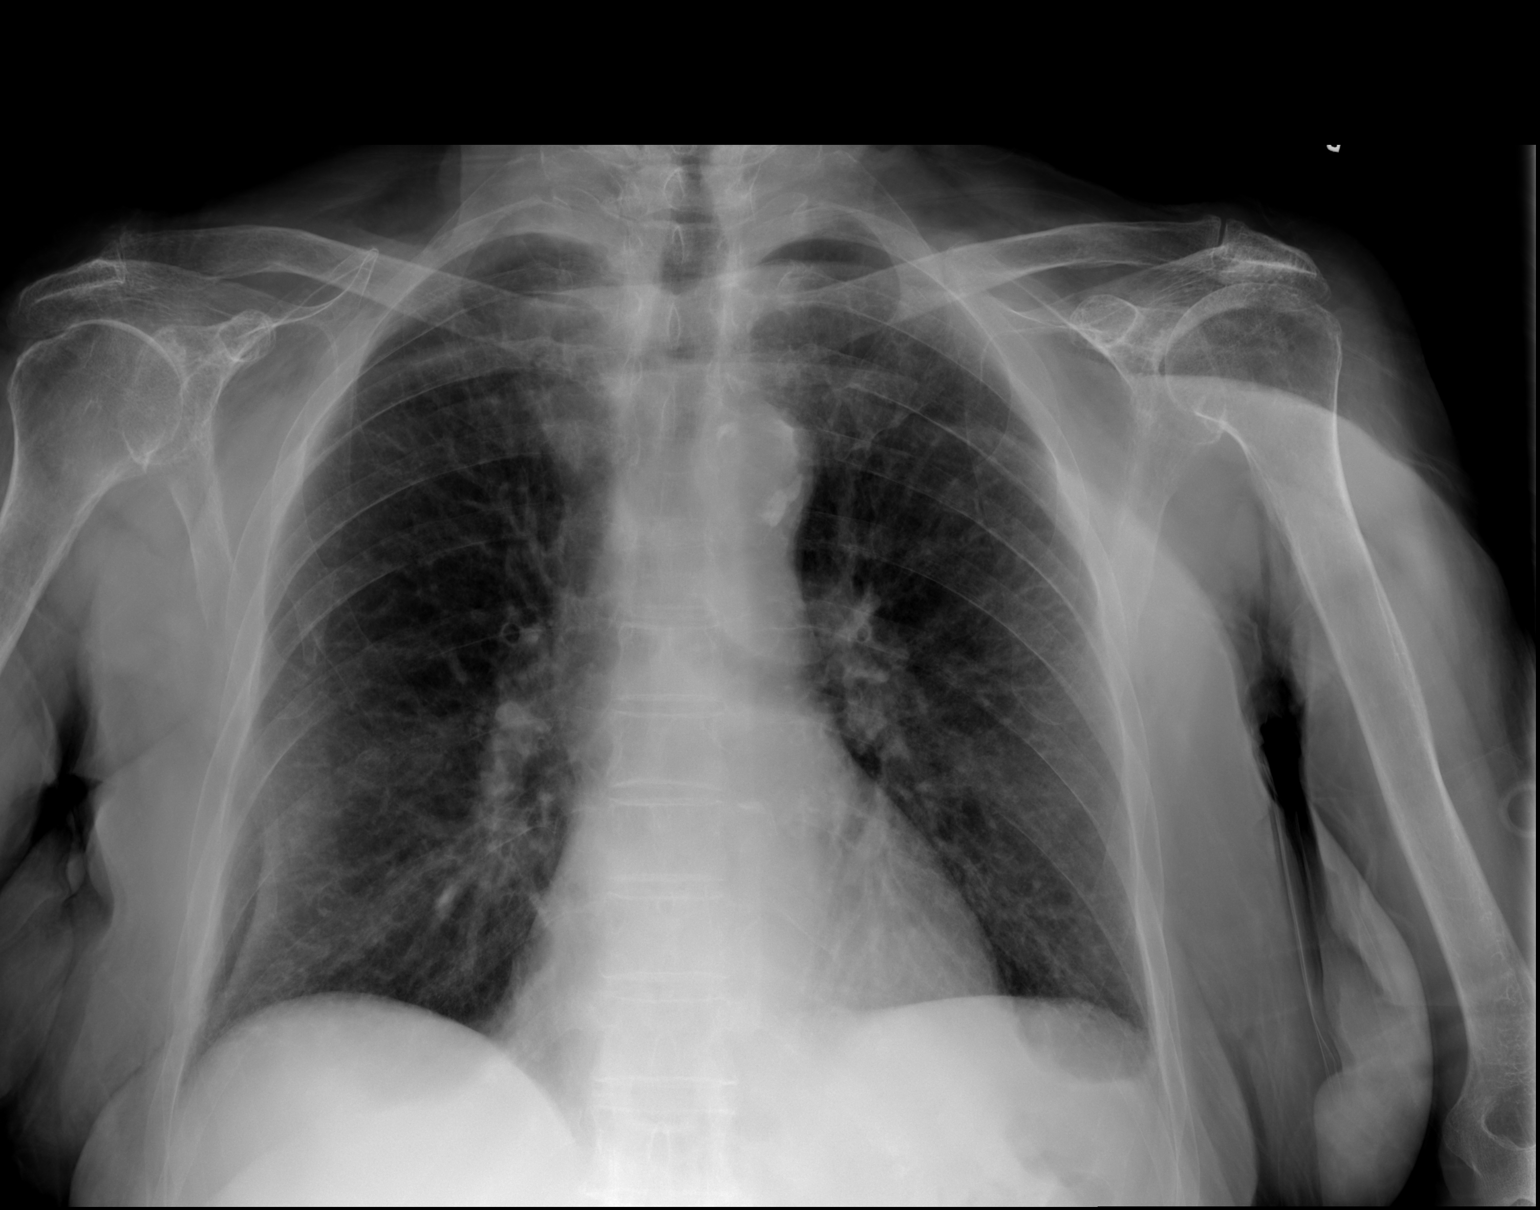

[1 of 1 positions shown; findings below may reference images not displayed]

FINDINGS: The lungs are clear. There is no pleural effusion or pneumothorax.
The cardiac silhouette is within normal limits. There is
atherosclerotic calcification of the aortic arch. There is
osteopenia with degenerative changes of the spine and shoulders. No
acute fracture.
IMPRESSION: No active disease.

## 2018-02-24 IMAGING — CT CT CERVICAL SPINE W/O CM
4 of 8 series · 12 of 33 positions shown, 13 images · non-contrast
Comparison: None.

CLINICAL DATA: [AGE] female with fall.

EXAM:
CT HEAD WITHOUT CONTRAST
CT CERVICAL SPINE WITHOUT CONTRAST
TECHNIQUE: Multidetector CT imaging of the head and cervical spine was
performed following the standard protocol without intravenous
contrast. Multiplanar CT image reconstructions of the cervical spine
were also generated.

[Series 5: coronal · coronal · 0.29mm/px · 1 of 60 slices shown]
[im 30/60  bone]
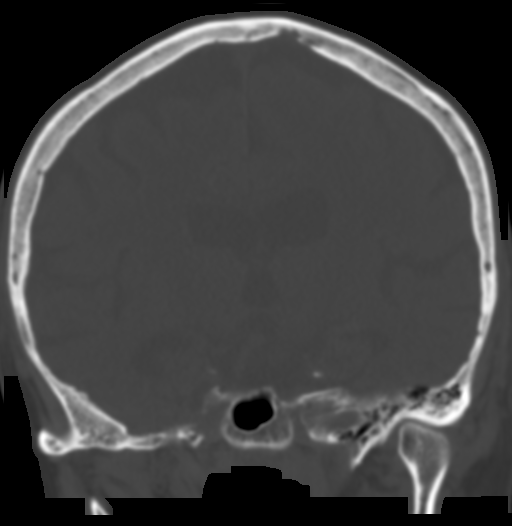

[Series 6: sagittal · sagittal · 0.30mm/px · 5 of 51 slices shown]
[im 9/51  bone]
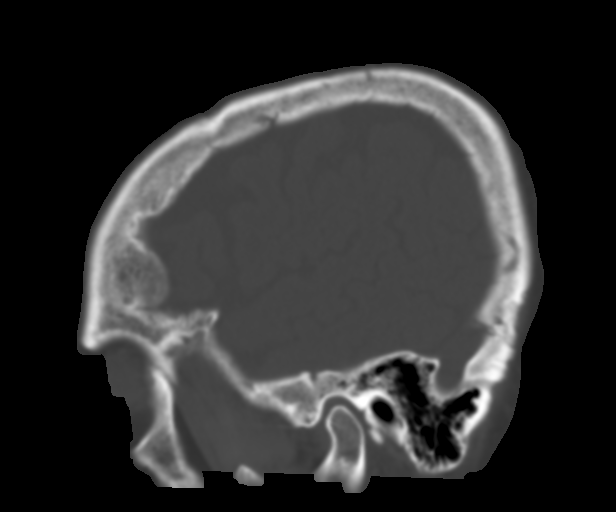
[im 17/51  bone]
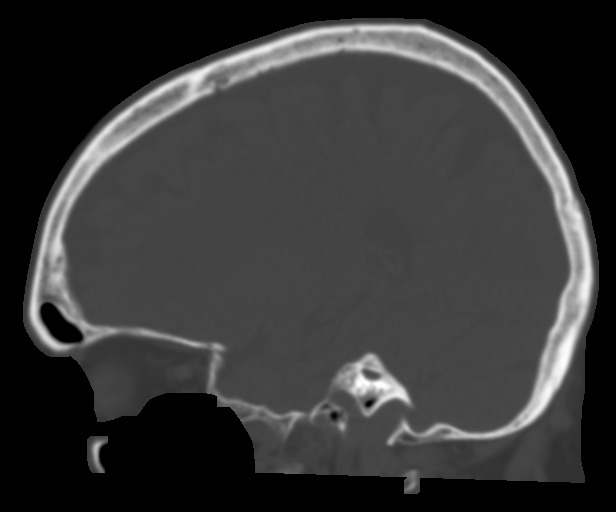
[im 26/51  bone]
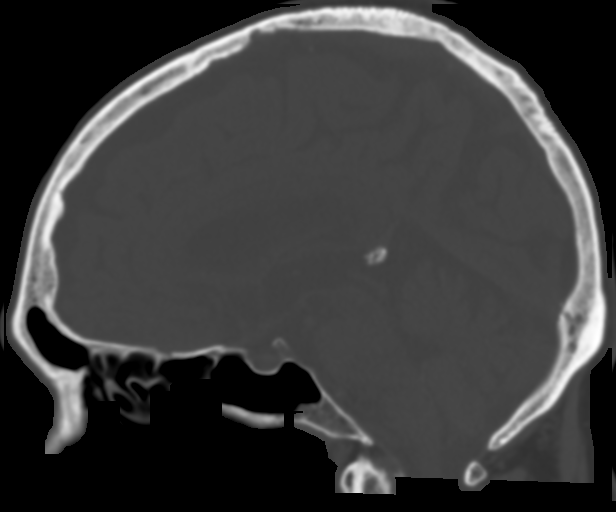
[im 34/51  bone]
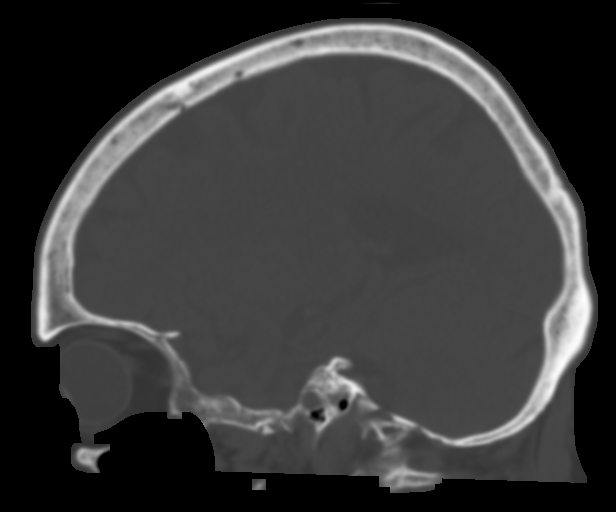
[im 42/51  bone]
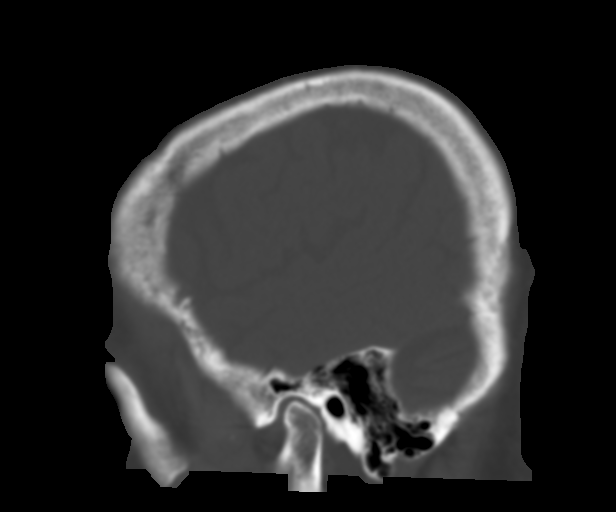

[Series 7: c-spine st · axial · 0.34mm/px · z∈[-232,-142]mm · 3 of 91 slices shown, 4 images]
[im 23/91  soft-tissue]
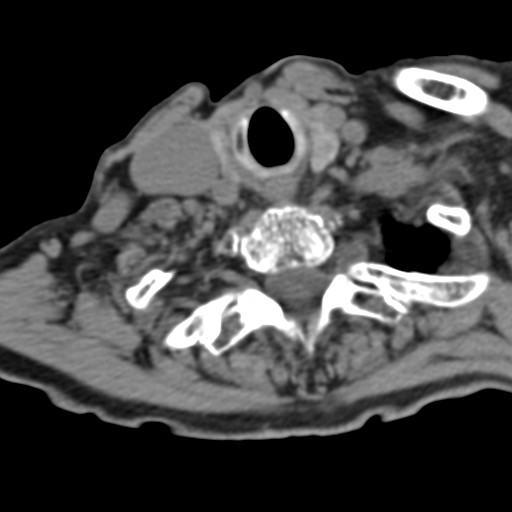
[im 23/91  bone]
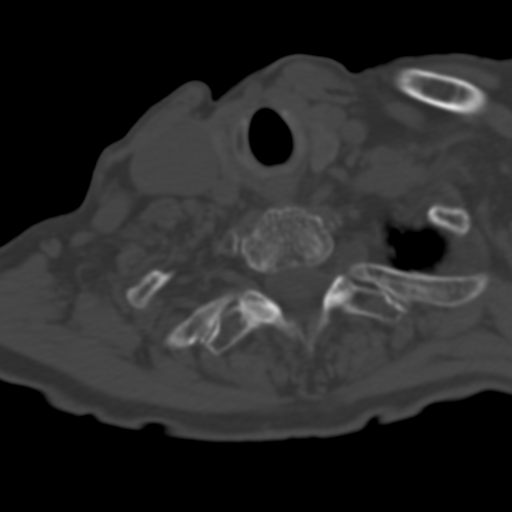
[im 46/91  bone]
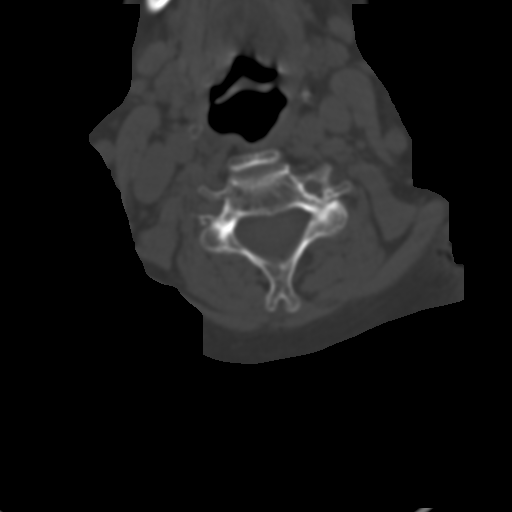
[im 68/91  bone]
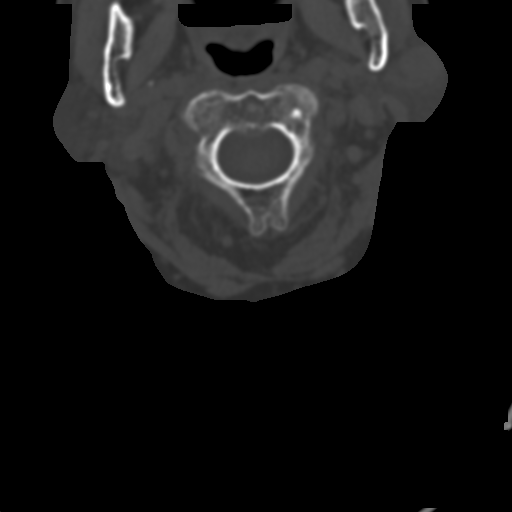

[Series 9: axial recon · axial · 0.18mm/px · z∈[-244,-159]mm · 3 of 93 slices shown]
[im 24/93  bone]
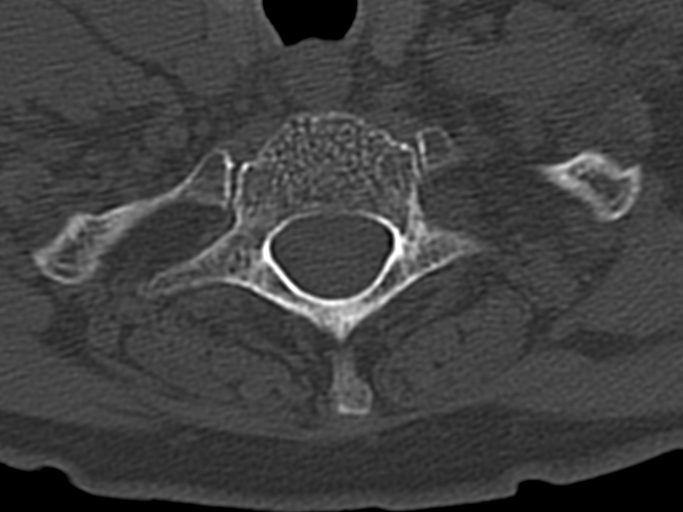
[im 47/93  bone]
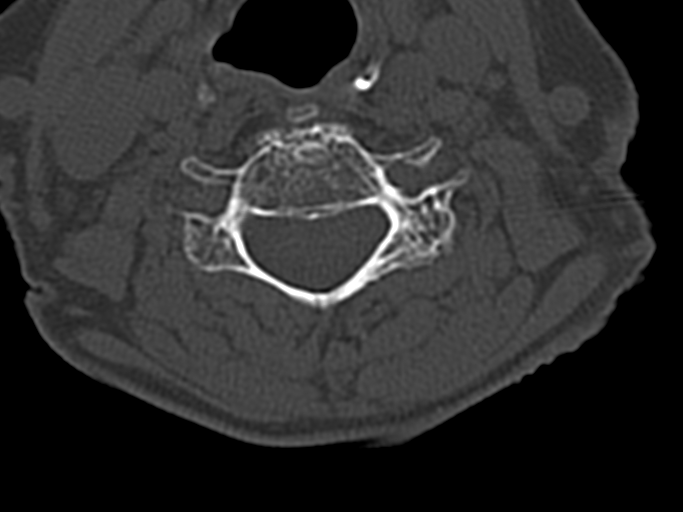
[im 70/93  bone]
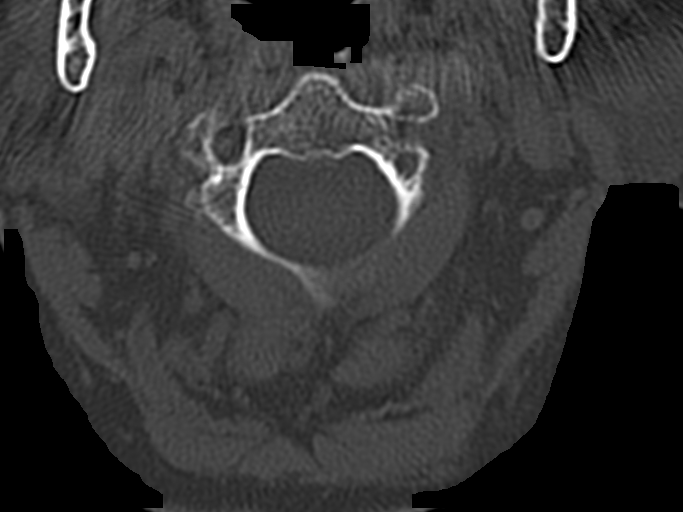

[12 of 33 positions shown; findings below may reference images not displayed]

FINDINGS: CT HEAD FINDINGS

Brain: There is mild age-related atrophy and chronic microvascular
ischemic changes. There is no acute intracranial hemorrhage. No mass
effect or midline shift noted. No extra-axial fluid collection.
There is a 7 x 10 mm dural based right anterior parafalcine
extra-axial lesion most compatible with a meningioma.

Vascular: No hyperdense vessel or unexpected calcification.

Skull: Normal. Negative for fracture or focal lesion.

Sinuses/Orbits: No acute finding.

Other: None

CT CERVICAL SPINE FINDINGS

Alignment: No acute subluxation. There is reversal of normal
cervical lordosis most prominent at C4-C5. This may be related to
muscle spasm or degenerative changes.

Skull base and vertebrae: No acute fracture. No primary bone lesion
or focal pathologic process.

Soft tissues and spinal canal: Bilateral intra parotid lesions
measuring up to 18 x 18 mm on the left. Ultrasound may provide
better evaluation. There is heterogeneity of the thyroid gland with
an 11 x 8 mm right lobe nodule.

Disc levels: Multilevel degenerative changes. There is grade 1 C4-C5
anterolisthesis. Disc space narrowing most prominent at C5-C6 and
C6-C7.

Upper chest: Mild interlobular septal thickening may represent a
degree of interstitial edema.

Other: Atherosclerotic calcification of the aortic arch. There is
mild asymmetric dilatation of the right internal jugular vein.
IMPRESSION: No acute intracranial hemorrhage.

Age-related atrophy and chronic microvascular ischemic changes.

Small right anterior parafalcine meningioma.

No acute/traumatic cervical spine pathology.

## 2019-01-08 ENCOUNTER — Other Ambulatory Visit: Payer: Self-pay

## 2019-01-08 ENCOUNTER — Encounter (HOSPITAL_COMMUNITY): Payer: Self-pay

## 2019-01-08 ENCOUNTER — Emergency Department (HOSPITAL_COMMUNITY): Payer: Medicare Other

## 2019-01-08 ENCOUNTER — Inpatient Hospital Stay (HOSPITAL_COMMUNITY)
Admission: EM | Admit: 2019-01-08 | Discharge: 2019-02-08 | DRG: 178 | Disposition: E | Payer: Medicare Other | Source: Skilled Nursing Facility | Attending: Internal Medicine | Admitting: Internal Medicine

## 2019-01-08 DIAGNOSIS — G9349 Other encephalopathy: Secondary | ICD-10-CM | POA: Diagnosis present

## 2019-01-08 DIAGNOSIS — R64 Cachexia: Secondary | ICD-10-CM | POA: Diagnosis present

## 2019-01-08 DIAGNOSIS — E870 Hyperosmolality and hypernatremia: Secondary | ICD-10-CM

## 2019-01-08 DIAGNOSIS — U071 COVID-19: Secondary | ICD-10-CM

## 2019-01-08 DIAGNOSIS — Z515 Encounter for palliative care: Secondary | ICD-10-CM

## 2019-01-08 DIAGNOSIS — Z66 Do not resuscitate: Secondary | ICD-10-CM | POA: Diagnosis present

## 2019-01-08 DIAGNOSIS — N179 Acute kidney failure, unspecified: Secondary | ICD-10-CM | POA: Diagnosis present

## 2019-01-08 DIAGNOSIS — Z79899 Other long term (current) drug therapy: Secondary | ICD-10-CM

## 2019-01-08 DIAGNOSIS — F0390 Unspecified dementia without behavioral disturbance: Secondary | ICD-10-CM | POA: Diagnosis present

## 2019-01-08 DIAGNOSIS — Z6821 Body mass index (BMI) 21.0-21.9, adult: Secondary | ICD-10-CM

## 2019-01-08 DIAGNOSIS — E785 Hyperlipidemia, unspecified: Secondary | ICD-10-CM | POA: Diagnosis present

## 2019-01-08 DIAGNOSIS — F329 Major depressive disorder, single episode, unspecified: Secondary | ICD-10-CM | POA: Diagnosis present

## 2019-01-08 DIAGNOSIS — R509 Fever, unspecified: Secondary | ICD-10-CM | POA: Diagnosis not present

## 2019-01-08 DIAGNOSIS — E860 Dehydration: Secondary | ICD-10-CM

## 2019-01-08 DIAGNOSIS — N3001 Acute cystitis with hematuria: Secondary | ICD-10-CM

## 2019-01-08 DIAGNOSIS — Z90710 Acquired absence of both cervix and uterus: Secondary | ICD-10-CM

## 2019-01-08 DIAGNOSIS — B964 Proteus (mirabilis) (morganii) as the cause of diseases classified elsewhere: Secondary | ICD-10-CM | POA: Diagnosis present

## 2019-01-08 DIAGNOSIS — R778 Other specified abnormalities of plasma proteins: Secondary | ICD-10-CM

## 2019-01-08 DIAGNOSIS — Z85828 Personal history of other malignant neoplasm of skin: Secondary | ICD-10-CM

## 2019-01-08 DIAGNOSIS — N390 Urinary tract infection, site not specified: Secondary | ICD-10-CM | POA: Diagnosis present

## 2019-01-08 DIAGNOSIS — R7989 Other specified abnormal findings of blood chemistry: Secondary | ICD-10-CM

## 2019-01-08 DIAGNOSIS — Z96641 Presence of right artificial hip joint: Secondary | ICD-10-CM | POA: Diagnosis present

## 2019-01-08 DIAGNOSIS — F419 Anxiety disorder, unspecified: Secondary | ICD-10-CM | POA: Diagnosis present

## 2019-01-08 LAB — CBC WITH DIFFERENTIAL/PLATELET
Abs Immature Granulocytes: 0.18 10*3/uL — ABNORMAL HIGH (ref 0.00–0.07)
Basophils Absolute: 0 10*3/uL (ref 0.0–0.1)
Basophils Relative: 0 %
Eosinophils Absolute: 0 10*3/uL (ref 0.0–0.5)
Eosinophils Relative: 0 %
HCT: 42.7 % (ref 36.0–46.0)
Hemoglobin: 13 g/dL (ref 12.0–15.0)
Immature Granulocytes: 1 %
Lymphocytes Relative: 24 %
Lymphs Abs: 5.3 10*3/uL — ABNORMAL HIGH (ref 0.7–4.0)
MCH: 30.9 pg (ref 26.0–34.0)
MCHC: 30.4 g/dL (ref 30.0–36.0)
MCV: 101.4 fL — ABNORMAL HIGH (ref 80.0–100.0)
Monocytes Absolute: 1.1 10*3/uL — ABNORMAL HIGH (ref 0.1–1.0)
Monocytes Relative: 5 %
Neutro Abs: 15.7 10*3/uL — ABNORMAL HIGH (ref 1.7–7.7)
Neutrophils Relative %: 70 %
Platelets: 423 10*3/uL — ABNORMAL HIGH (ref 150–400)
RBC: 4.21 MIL/uL (ref 3.87–5.11)
RDW: 15.1 % (ref 11.5–15.5)
WBC: 22.3 10*3/uL — ABNORMAL HIGH (ref 4.0–10.5)
nRBC: 0 % (ref 0.0–0.2)

## 2019-01-08 LAB — COMPREHENSIVE METABOLIC PANEL
ALT: 21 U/L (ref 0–44)
AST: 23 U/L (ref 15–41)
Albumin: 3.3 g/dL — ABNORMAL LOW (ref 3.5–5.0)
Alkaline Phosphatase: 71 U/L (ref 38–126)
Anion gap: 13 (ref 5–15)
BUN: 104 mg/dL — ABNORMAL HIGH (ref 8–23)
CO2: 20 mmol/L — ABNORMAL LOW (ref 22–32)
Calcium: 10.5 mg/dL — ABNORMAL HIGH (ref 8.9–10.3)
Chloride: 129 mmol/L — ABNORMAL HIGH (ref 98–111)
Creatinine, Ser: 1.79 mg/dL — ABNORMAL HIGH (ref 0.44–1.00)
GFR calc Af Amer: 27 mL/min — ABNORMAL LOW (ref >60)
GFR calc non Af Amer: 24 mL/min — ABNORMAL LOW (ref >60)
Glucose, Bld: 129 mg/dL — ABNORMAL HIGH (ref 70–99)
Potassium: 3.6 mmol/L (ref 3.5–5.1)
Sodium: 162 mmol/L (ref 135–145)
Total Bilirubin: 0.8 mg/dL (ref 0.3–1.2)
Total Protein: 7.7 g/dL (ref 6.5–8.1)

## 2019-01-08 LAB — TROPONIN I (HIGH SENSITIVITY): Troponin I (High Sensitivity): 1113 ng/L (ref <18)

## 2019-01-08 LAB — POC SARS CORONAVIRUS 2 AG -  ED: SARS Coronavirus 2 Ag: NEGATIVE

## 2019-01-08 MED ORDER — SODIUM CHLORIDE 0.9 % IV BOLUS
1000.0000 mL | Freq: Once | INTRAVENOUS | Status: AC
  Administered 2019-01-08: 23:00:00 1000 mL via INTRAVENOUS

## 2019-01-08 MED ORDER — SODIUM CHLORIDE 0.9 % IV SOLN: Freq: Once | INTRAVENOUS | Status: AC

## 2019-01-08 NOTE — ED Notes (Signed)
purewick in place after getting rectal temp and in and out cath

## 2019-01-08 NOTE — ED Triage Notes (Signed)
Per [facility], pt from [facility] for positive covid since Monday and per MD the patient is dehydrated.  Per EMS pt responds to painful stimuli. Pt is also DNR

## 2019-01-08 NOTE — ED Notes (Signed)
Critical sodium of 162 called by lab. Primary RN and MD to be made aware.

## 2019-01-08 NOTE — ED Notes (Signed)
Repeat EKG collected

## 2019-01-08 NOTE — ED Provider Notes (Signed)
[facility] EMERGENCY DEPARTMENT Provider Note   CSN: [id number] Arrival date & time: 01/19/2019  2047     History Chief Complaint  Patient presents with  . Dehydration  . covid positive    Debra [name] is a 86 y.o. female with a past medical history of CKD, dementia, anxiety, dyslipidemia, who presents today for evaluation of altered mental status.  History primarily obtained from triage note and paper sent with patient.  Patient reportedly tested positive for Covid 4 days ago and has been having poor p.o. intake.  Labs were obtained at the facility, [facility], where she was noted to be hypernatremic concerning for dehydration.  I spoke with patient's daughter, her POA, who reports that at baseline patient is occasionally confused however recognizes her, will hold a conversation, and occasionally gets distracted however is able to answer questions without difficulty.     HPI     Past Medical History:  Diagnosis Date  . 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 Essential and other specified forms of tremor   . History of fall 09/12/2015  . Lymphocytosis 01/29/2016  . Memory deficit 04/03/2013  . Nodular lesion on surface of skin 07/29/2013   Right flank. Black. About 7mm diameter. Nodular.   . Other and unspecified hyperlipidemia   . Senile dementia, uncomplicated ([place])   . T12 compression fracture ([place]) 02/21/2016  . Urinary frequency 04/03/2013   Nocturnal, 4-5x/night.       Patient Active Problem List   Diagnosis Date Noted  . CKD (chronic kidney disease) stage 2, GFR 60-89 ml/min 03/27/2016  . Anxiety 03/20/2016  . Drowsy 03/12/2016  . Anemia 03/12/2016  . Insomnia 02/21/2016  . T12 compression fracture ([place]) 02/21/2016  . Lymphocytosis 01/29/2016  . Dementia ([place]) 01/29/2016  . Closed right hip fracture ([place]) 01/29/2016  . Abnormal ECG 01/29/2016  . Fracture of femoral neck, right ([place]) 01/29/2016  . History of fall 09/12/2015  . Hallucinations 02/08/2015  . Hearing  deficit 02/10/2014  . Abnormality of gait 11/25/2013  . 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 Tremor 04/03/2013  . Urinary frequency 04/03/2013  . Memory deficit 04/03/2013  . 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 Dyslipidemia 04/03/2013    Past Surgical History:  Procedure Laterality Date  . ABDOMINAL HYSTERECTOMY  1966  . CATARACT EXTRACTION Right 2013   Dr. Herbert Deaner  . excise skin cancer left upper arm Left 2013   Dr. Jari Pigg  . HIP ARTHROPLASTY Right 01/29/2016   Procedure: ARTHROPLASTY BIPOLAR HIP (HEMIARTHROPLASTY);  Surgeon: Gaynelle Arabian, MD;  Location: [facility] ORS;  Service: Orthopedics;  Laterality: Right;  . TONSILLECTOMY       OB History   No obstetric history on file.     Family History  Problem Relation Age of Onset  . CAD Mother   . Diabetes Neg Hx   . Stroke Neg Hx   . Cancer Neg Hx   . Hypertension Neg Hx     Social History   Tobacco Use  . Smoking status: Never Smoker  . Smokeless tobacco: Never Used  Substance Use Topics  . Alcohol use: No  . Drug use: No    Home Medications Prior to Admission medications   Medication Sig Start Date End Date Taking? Authorizing Provider  acetaminophen (TYLENOL) 500 MG tablet One every 4 hours if needed for pain Patient taking differently: Take 500 mg by mouth.  03/12/16  Yes Estill Dooms, MD  Cholecalciferol (VITAMIN D) 50 MCG (2000 UT) tablet Take 2,000 Units by mouth.  Yes [provider]  dexamethasone (DECADRON) 6 MG tablet Take 6 mg by mouth.   Yes [provider]  enoxaparin (LOVENOX) 40 MG/0.4ML injection Inject 40 mg into the skin. 01/04/19  Yes [provider]  escitalopram (LEXAPRO) 10 MG tablet 1 daily to help anxiety and depression Patient taking differently: Take 10 mg by mouth. 1 daily to help anxiety and depression 03/20/16  Yes Estill Dooms, MD  Melatonin 5 MG TABS Take 5 mg by mouth.   Yes [provider]  Nutritional Supplements (NUTRITIONAL SUPPLEMENT PO) Take by mouth. [employer]  2.0   Yes [provider]  Propylene Glycol (SYSTANE BALANCE) 0.6 % SOLN    Yes [provider]  Melatonin 5 MG/ML LIQD Take 1 mL by mouth at bedtime. 03/20/16   Estill Dooms, MD    Allergies    Patient has no known allergies.  Review of Systems   Review of Systems  Unable to perform ROS: Patient nonverbal    Physical Exam Updated Vital Signs BP (!) 164/66   Pulse 82   Temp 98.5 F (36.9 C) (Rectal)   Resp 17   SpO2 99%   Physical Exam Vitals and nursing note reviewed.  Constitutional:      Appearance: She is well-developed.     Comments: Cachectic, chronically ill-appearing, nonresponsive to voice  HENT:     Head: Normocephalic and atraumatic.  Eyes:     General: No scleral icterus.       Right eye: No discharge.        Left eye: No discharge.     Conjunctiva/sclera: Conjunctivae normal.  Cardiovascular:     Rate and Rhythm: Normal rate and regular rhythm.  Pulmonary:     Effort: Pulmonary effort is normal. No respiratory distress.  Abdominal:     General: There is no distension.     Tenderness: There is no abdominal tenderness.  Musculoskeletal:        General: No deformity.     Cervical back: Normal range of motion and neck supple.     Right lower leg: No edema.     Left lower leg: No edema.  Skin:    General: Skin is warm and dry.  Neurological:     GCS: GCS eye subscore is 1. GCS verbal subscore is 1. GCS motor subscore is 4.     Motor: Abnormal muscle tone (Patient resists flexion of extremities. ) present.  Psychiatric:     Comments: Unable to assess     ED Results / Procedures / Treatments   Labs (all labs ordered are listed, but only abnormal results are displayed) Labs Reviewed  RESPIRATORY PANEL BY RT PCR (FLU A&B, COVID) - Abnormal; Notable for the following components:      Result Value   SARS Coronavirus 2 by RT PCR POSITIVE (*)    All other components within normal limits  CBC WITH DIFFERENTIAL/PLATELET - Abnormal;  Notable for the following components:   WBC 22.3 (*)    MCV 101.4 (*)    Platelets 423 (*)    Neutro Abs 15.7 (*)    Lymphs Abs 5.3 (*)    Monocytes Absolute 1.1 (*)    Abs Immature Granulocytes 0.18 (*)    All other components within normal limits  COMPREHENSIVE METABOLIC PANEL - Abnormal; Notable for the following components:   Sodium 162 (*)    Chloride 129 (*)    CO2 20 (*)    Glucose, Bld 129 (*)  BUN 104 (*)    Creatinine, Ser 1.79 (*)    Calcium 10.5 (*)    Albumin 3.3 (*)    GFR calc non Af Amer 24 (*)    GFR calc Af Amer 27 (*)    All other components within normal limits  URINALYSIS, ROUTINE W REFLEX MICROSCOPIC - Abnormal; Notable for the following components:   Color, Urine BROWN (*)    APPearance TURBID (*)    Glucose, UA 100 (*)    Hgb urine dipstick LARGE (*)    Bilirubin Urine SMALL (*)    Ketones, ur 15 (*)    Protein, ur >300 (*)    Leukocytes,Ua MODERATE (*)    All other components within normal limits  URINALYSIS, MICROSCOPIC (REFLEX) - Abnormal; Notable for the following components:   Bacteria, UA MANY (*)    All other components within normal limits  TROPONIN I (HIGH SENSITIVITY) - Abnormal; Notable for the following components:   Troponin I (High Sensitivity) 1,113 (*)    All other components within normal limits  CULTURE, BLOOD (ROUTINE X 2)  CULTURE, BLOOD (ROUTINE X 2)  URINE CULTURE  POC SARS CORONAVIRUS 2 AG -  ED  TROPONIN I (HIGH SENSITIVITY)    EKG EKG Interpretation  Date/Time:  Friday January 08 2019 20:56:22 EST Ventricular Rate:  90 PR Interval:    QRS Duration: 77 QT Interval:  369 QTC Calculation: 452 R Axis:   69 Text Interpretation: Sinus rhythm Multiform ventricular premature complexes Borderline short PR interval Biatrial enlargement LVH with secondary repolarization abnormality ST depression, consider ischemia, diffuse lds Minimal ST elevation, lateral leads Confirmed by Madalyn Rob [phone]) on 01/30/2019 9:30:41  PM   Radiology DG Chest Portable 1 View  Result Date: 01/18/2019 CLINICAL DATA:  COVID pneumonia. Shortness of breath. EXAM: PORTABLE CHEST 1 VIEW COMPARISON:  March 15, 2016 FINDINGS: Aortic calcifications are noted. The lungs appear hyperexpanded. There is no pneumothorax or large pleural effusion. No significant pulmonary infiltrates. There is no acute osseous abnormality. There is a compression fracture of the T12 vertebral body as seen on prior studies. IMPRESSION: 1. No active cardiopulmonary disease. 2. Aortic atherosclerosis. 3. Stable T12 vertebral body compression fracture. 4. Possible underlying COPD. Electronically Signed   By: Constance Holster M.D.   On: 01/28/2019 21:25    Procedures Procedures (including critical care time)  Medications Ordered in ED Medications  cefTRIAXone (ROCEPHIN) 1 g in sodium chloride 0.9 % 100 mL IVPB (has no administration in time range)  sodium chloride 0.9 % bolus 500 mL (has no administration in time range)  0.9 %  sodium chloride infusion ( Intravenous New Bag/Given 01/27/2019 2130)  sodium chloride 0.9 % bolus 1,000 mL (0 mLs Intravenous Stopped 01/22/2019 2342)    ED Course  I have reviewed the triage vital signs and the nursing notes.  Pertinent labs & imaging results that were available during my care of the patient were reviewed by me and considered in my medical decision making (see chart for details).  Clinical Course as of Jan 09 103  Ludwig Clarks Jan 08, 2019  2119 Covid- no fever and cough for 2 daysNot eating much PO Friday night was positive. Normally recognizes daughter, asks questions, has moments of perfect clarity.    [EH]  2125 No intubation   [EH]  2355 I had extensive conversation with patient's daughter, her power of attorney.  We discussed results that are back along with elevated troponin, overall mental state, and her significant electrolyte derangements  in the setting of being Covid positive.  We discussed what she anticipates her  mother's wishes would be and while she is okay with IV fluids for continued rehydration and antibiotics if there is a clear infection at this point that overall she wishes to transition towards more comfort care and relieving any discomfort or significant symptoms rather than trying to reverse her current condition.   [EH]    Clinical Course User Index [EH] Ollen Gross   MDM Rules/Calculators/A&P                     Patient presents today for evaluation of suspected dehydration.  She is a resident at [facility] and was transferred here for fluids.  She has a DNR at bedside.  She reportedly tested positive about one week ago.  Her baseline mental status is reportedly awake and alert, able to answer questions and interact with her daughter.  Here she is nonverbal, does not open her eyes and withdraws from pain.    Labs were obtained, CBC is significantly elevated at 20.3.  CMP is significant for a sodium of 162, chloride of 129 with BUN of 104 and creatinine of 1.79.  Troponin is significantly elevated at 1113.  I spoke with Dr. Radford Pax on-call for cardiology who confirms no interventions recommended.    Rapid covid test negative.  2 hour covid test ordered.  UA Shows many bacteria with 21-50 white cells, over 50 red cells and many bacteria present.  IV antibiotics ordered.  She has urine culture sent and blood cultures are already ordered.  I have had multiple discussions with her daughter [name] who is her healthcare power of attorney.  Overall she feels like Ms. Pundt would not want intubation.  She has a DNR at bedside.   Per our discussion, which I repeated and had patient's power of attorney confirm patient is DNR, DNI, no aggressive interventions.  At this point no additional CT scans.  Medications are to be used to relieve any symptoms with the primary focus on comfort care and a trial of antibiotics for her UTI.  No vasopressors, central lines, or  BiPAP/CPAP.   I these with the patient's power of attorney who confirmed.  The hope is that patient can possibly be discharged tomorrow back to her skilled nursing facility once comfort care/palliative measures are in place.   I spoke with Dr. Elmer Picker who will see the patient for admission.  This patient was seen as a shared visit with Dr. Roslynn Amble.    Note: Portions of this report may have been transcribed using voice recognition software. Every effort was made to ensure accuracy; however, inadvertent computerized transcription errors may be present  Final Clinical Impression(s) / ED Diagnoses Final diagnoses:  COVID-19  Hypernatremia  Dehydration  Elevated troponin I level  Acute cystitis with hematuria    Rx / DC Orders ED Discharge Orders    None       Lorin Glass, PA-C 01/09/19 0105    Lucrezia Starch, MD 01/09/19 2340

## 2019-01-09 ENCOUNTER — Encounter (HOSPITAL_COMMUNITY): Payer: Self-pay | Admitting: Internal Medicine

## 2019-01-09 DIAGNOSIS — Z6821 Body mass index (BMI) 21.0-21.9, adult: Secondary | ICD-10-CM | POA: Diagnosis not present

## 2019-01-09 DIAGNOSIS — E860 Dehydration: Secondary | ICD-10-CM | POA: Diagnosis present

## 2019-01-09 DIAGNOSIS — B964 Proteus (mirabilis) (morganii) as the cause of diseases classified elsewhere: Secondary | ICD-10-CM | POA: Diagnosis present

## 2019-01-09 DIAGNOSIS — Z66 Do not resuscitate: Secondary | ICD-10-CM | POA: Diagnosis present

## 2019-01-09 DIAGNOSIS — F419 Anxiety disorder, unspecified: Secondary | ICD-10-CM | POA: Diagnosis present

## 2019-01-09 DIAGNOSIS — R778 Other specified abnormalities of plasma proteins: Secondary | ICD-10-CM

## 2019-01-09 DIAGNOSIS — R509 Fever, unspecified: Secondary | ICD-10-CM | POA: Diagnosis present

## 2019-01-09 DIAGNOSIS — N390 Urinary tract infection, site not specified: Secondary | ICD-10-CM | POA: Diagnosis present

## 2019-01-09 DIAGNOSIS — Z85828 Personal history of other malignant neoplasm of skin: Secondary | ICD-10-CM | POA: Diagnosis not present

## 2019-01-09 DIAGNOSIS — E870 Hyperosmolality and hypernatremia: Secondary | ICD-10-CM | POA: Diagnosis present

## 2019-01-09 DIAGNOSIS — N179 Acute kidney failure, unspecified: Secondary | ICD-10-CM | POA: Diagnosis present

## 2019-01-09 DIAGNOSIS — N3001 Acute cystitis with hematuria: Secondary | ICD-10-CM | POA: Diagnosis not present

## 2019-01-09 DIAGNOSIS — F0390 Unspecified dementia without behavioral disturbance: Secondary | ICD-10-CM | POA: Diagnosis present

## 2019-01-09 DIAGNOSIS — Z90710 Acquired absence of both cervix and uterus: Secondary | ICD-10-CM | POA: Diagnosis not present

## 2019-01-09 DIAGNOSIS — U071 COVID-19: Secondary | ICD-10-CM | POA: Diagnosis present

## 2019-01-09 DIAGNOSIS — G9349 Other encephalopathy: Secondary | ICD-10-CM | POA: Diagnosis present

## 2019-01-09 DIAGNOSIS — R64 Cachexia: Secondary | ICD-10-CM | POA: Diagnosis present

## 2019-01-09 DIAGNOSIS — Z515 Encounter for palliative care: Secondary | ICD-10-CM

## 2019-01-09 DIAGNOSIS — Z79899 Other long term (current) drug therapy: Secondary | ICD-10-CM | POA: Diagnosis not present

## 2019-01-09 DIAGNOSIS — E785 Hyperlipidemia, unspecified: Secondary | ICD-10-CM | POA: Diagnosis present

## 2019-01-09 DIAGNOSIS — Z96641 Presence of right artificial hip joint: Secondary | ICD-10-CM | POA: Diagnosis present

## 2019-01-09 DIAGNOSIS — F329 Major depressive disorder, single episode, unspecified: Secondary | ICD-10-CM | POA: Diagnosis present

## 2019-01-09 LAB — URINALYSIS, MICROSCOPIC (REFLEX): RBC / HPF: >50 RBC/hpf (ref 0–5)

## 2019-01-09 LAB — URINALYSIS, ROUTINE W REFLEX MICROSCOPIC
Glucose, UA: 100 mg/dL — AB
Ketones, ur: 15 mg/dL — AB
Nitrite: NEGATIVE
Protein, ur: >300 mg/dL — AB
Specific Gravity, Urine: 1.02 (ref 1.005–1.030)
pH: 8 (ref 5.0–8.0)

## 2019-01-09 LAB — CBC
HCT: 36.6 % (ref 36.0–46.0)
Hemoglobin: 11.3 g/dL — ABNORMAL LOW (ref 12.0–15.0)
MCH: 31.4 pg (ref 26.0–34.0)
MCHC: 30.9 g/dL (ref 30.0–36.0)
MCV: 101.7 fL — ABNORMAL HIGH (ref 80.0–100.0)
Platelets: 339 10*3/uL (ref 150–400)
RBC: 3.6 MIL/uL — ABNORMAL LOW (ref 3.87–5.11)
RDW: 15.1 % (ref 11.5–15.5)
WBC: 21.1 10*3/uL — ABNORMAL HIGH (ref 4.0–10.5)
nRBC: 0 % (ref 0.0–0.2)

## 2019-01-09 LAB — CREATININE, SERUM
Creatinine, Ser: 1.41 mg/dL — ABNORMAL HIGH (ref 0.44–1.00)
GFR calc Af Amer: 37 mL/min — ABNORMAL LOW (ref >60)
GFR calc non Af Amer: 32 mL/min — ABNORMAL LOW (ref >60)

## 2019-01-09 LAB — RESPIRATORY PANEL BY RT PCR (FLU A&B, COVID)
Influenza A by PCR: NEGATIVE
Influenza B by PCR: NEGATIVE
SARS Coronavirus 2 by RT PCR: POSITIVE — AB

## 2019-01-09 LAB — TROPONIN I (HIGH SENSITIVITY): Troponin I (High Sensitivity): 847 ng/L (ref <18)

## 2019-01-09 MED ORDER — DEXTROSE 5 % IV SOLN
INTRAVENOUS | Status: AC
  Administered 2019-01-09: 1000 mL via INTRAVENOUS

## 2019-01-09 MED ORDER — SODIUM CHLORIDE 0.9 % IV SOLN
1.0000 g | Freq: Once | INTRAVENOUS | Status: AC
  Administered 2019-01-09: 1 g via INTRAVENOUS

## 2019-01-09 MED ORDER — VITAMIN C 500 MG/5ML PO SYRP
500.0000 mg | ORAL_SOLUTION | Freq: Every day | ORAL | Status: DC
  Filled 2019-01-09 (×2): qty 5

## 2019-01-09 MED ORDER — SODIUM CHLORIDE 0.9 % IV BOLUS
500.0000 mL | Freq: Once | INTRAVENOUS | Status: AC
  Administered 2019-01-09: 500 mL via INTRAVENOUS

## 2019-01-09 MED ORDER — ACETAMINOPHEN 325 MG PO TABS: 650.0000 mg | ORAL_TABLET | Freq: Four times a day (QID) | ORAL | Status: DC | PRN

## 2019-01-09 MED ORDER — ONDANSETRON HCL 4 MG PO TABS: 4.0000 mg | ORAL_TABLET | Freq: Four times a day (QID) | ORAL | Status: DC | PRN

## 2019-01-09 MED ORDER — HYDRALAZINE HCL 20 MG/ML IJ SOLN
5.0000 mg | Freq: Four times a day (QID) | INTRAMUSCULAR | Status: AC | PRN
  Administered 2019-01-10 (×2): 5 mg via INTRAVENOUS
  Filled 2019-01-09 (×2): qty 1

## 2019-01-09 MED ORDER — HYDRALAZINE HCL 20 MG/ML IJ SOLN: 10.0000 mg | Freq: Four times a day (QID) | INTRAMUSCULAR | Status: DC | PRN

## 2019-01-09 MED ORDER — ENOXAPARIN SODIUM 30 MG/0.3ML ~~LOC~~ SOLN
30.0000 mg | SUBCUTANEOUS | Status: DC
  Administered 2019-01-09 – 2019-01-10 (×2): 30 mg via SUBCUTANEOUS
  Filled 2019-01-09 (×2): qty 0.3

## 2019-01-09 MED ORDER — ZINC SULFATE 220 (50 ZN) MG PO CAPS
220.0000 mg | ORAL_CAPSULE | Freq: Every day | ORAL | Status: DC
  Filled 2019-01-09: qty 1

## 2019-01-09 MED ORDER — SODIUM CHLORIDE 0.9 % IV SOLN
1.0000 g | INTRAVENOUS | Status: DC
  Administered 2019-01-10: 1 g via INTRAVENOUS
  Filled 2019-01-09 (×2): qty 10

## 2019-01-09 MED ORDER — MORPHINE SULFATE (PF) 2 MG/ML IV SOLN
1.0000 mg | INTRAVENOUS | Status: DC | PRN
  Administered 2019-01-10: 1 mg via INTRAVENOUS
  Filled 2019-01-09: qty 1

## 2019-01-09 MED ORDER — ACETAMINOPHEN 650 MG RE SUPP: 650.0000 mg | Freq: Four times a day (QID) | RECTAL | Status: DC | PRN

## 2019-01-09 MED ORDER — ONDANSETRON HCL 4 MG/2ML IJ SOLN: 4.0000 mg | Freq: Four times a day (QID) | INTRAMUSCULAR | Status: DC | PRN

## 2019-01-09 NOTE — H&P (Signed)
History and Physical    Debra [name] [id number] DOB: [birth year] DOA: 01/15/2019  PCP: Patient, No Pcp Per  Patient coming from: [place].  History obtained from patient's daughter Ms. [name].  Can be contacted at [phone].  Chief Complaint: Altered mental status.  HPI: Debra [name] is a 86 y.o. female with history of advanced dementia and depression who was recently diagnosed with COVID-19 infection about 7 days ago has been doing poorly since then with fever chills poor appetite and also had some diarrhea initially.  Since patient mentation has progressively worsened with minimal response patient was transferred to the ER.  ED Course: In the ER patient was afebrile but on exam is minimally responsive to painful stimuli.  Labs reveal creatinine 1.7 sodium 162 chloride 129 calcium 10.5 troponin XI 100 WBC 23.3 EKG showed normal sinus rhythm with nonspecific changes chest x-ray unremarkable and Covid test positive but UA shows features concerning for UTI.  Patient was started on IV fluids for acute renal failure and hypernatremia and dehydration and ceftriaxone for UTI but at this time patient's daughter has wished only for her to be made comfortable.  No aggressive measures.  Review of Systems: As per HPI, rest all negative.   Past Medical History:  Diagnosis Date  . 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 Essential and other specified forms of tremor   . History of fall 09/12/2015  . Lymphocytosis 01/29/2016  . Memory deficit 04/03/2013  . Nodular lesion on surface of skin 07/29/2013   Right flank. Black. About 69mm diameter. Nodular.   . Other and unspecified hyperlipidemia   . Senile dementia, uncomplicated ([place])   . T12 compression fracture ([place]) 02/21/2016  . Urinary frequency 04/03/2013   Nocturnal, 4-5x/night.       Past Surgical History:  Procedure Laterality Date  . ABDOMINAL HYSTERECTOMY  1966  . CATARACT EXTRACTION Right 2013   Dr. Herbert Deaner  . excise skin cancer left  upper arm Left 2013   Dr. Jari Pigg  . HIP ARTHROPLASTY Right 01/29/2016   Procedure: ARTHROPLASTY BIPOLAR HIP (HEMIARTHROPLASTY);  Surgeon: Gaynelle Arabian, MD;  Location: [facility];  Service: Orthopedics;  Laterality: Right;  . TONSILLECTOMY       reports that she has never smoked. She has never used smokeless tobacco. She reports that she does not drink alcohol or use drugs.  No Known Allergies  Family History  Problem Relation Age of Onset  . CAD Mother   . Diabetes Neg Hx   . Stroke Neg Hx   . Cancer Neg Hx   . Hypertension Neg Hx     Prior to Admission medications   Medication Sig Start Date End Date Taking? Authorizing Provider  acetaminophen (TYLENOL) 500 MG tablet One every 4 hours if needed for pain Patient taking differently: Take 500 mg by mouth.  03/12/16  Yes Estill Dooms, MD  Cholecalciferol (VITAMIN D) 50 MCG (2000 UT) tablet Take 2,000 Units by mouth.   Yes [provider]  dexamethasone (DECADRON) 6 MG tablet Take 6 mg by mouth.   Yes [provider]  enoxaparin (LOVENOX) 40 MG/0.4ML injection Inject 40 mg into the skin. 01/04/19  Yes [provider]  escitalopram (LEXAPRO) 10 MG tablet 1 daily to help anxiety and depression Patient taking differently: Take 10 mg by mouth. 1 daily to help anxiety and depression 03/20/16  Yes Estill Dooms, MD  Melatonin 5 MG TABS Take 5 mg by mouth.   Yes [provider]  Nutritional Supplements (NUTRITIONAL SUPPLEMENT PO) Take by mouth. MedPass 2.0   Yes [provider]  Propylene Glycol (SYSTANE BALANCE) 0.6 % SOLN    Yes [provider]  Melatonin 5 MG/ML LIQD Take 1 mL by mouth at bedtime. 03/20/16   Estill Dooms, MD    Physical Exam: Constitutional: Moderately built and nourished. Vitals:   01/16/2019 2330 02/07/2019 2330 01/09/19 0000 01/09/19 0100  BP: (!) 169/67  (!) 164/66 (!) 142/62  Pulse: 84  82 73  Resp: 20  17 15   Temp:  98.5 F (36.9 C)    TempSrc:  Rectal     SpO2: 98%  99% 97%   Eyes: Anicteric no pallor. ENMT: No discharge from the ears eyes nose or mouth. Neck: No mass felt.  No neck rigidity. Respiratory: No rhonchi or crepitation. Cardiovascular: S1-S2 heard. Abdomen: Soft nontender bowel sound present. Musculoskeletal: No edema. Skin: No rash. Neurologic: Patient is minimally responsive to deep painful stimuli.  Pupils reacting to light. Psychiatric: Minimally responsive.   Labs on Admission: I have personally reviewed following labs and imaging studies  CBC: Recent Labs  Lab 01/27/2019 2115  WBC 22.3*  NEUTROABS 15.7*  HGB 13.0  HCT 42.7  MCV 101.4*  PLT 99991111*   Basic Metabolic Panel: Recent Labs  Lab 02/07/2019 2115  NA 162*  K 3.6  CL 129*  CO2 20*  GLUCOSE 129*  BUN 104*  CREATININE 1.79*  CALCIUM 10.5*   GFR: CrCl cannot be calculated (Unknown ideal weight.). Liver Function Tests: Recent Labs  Lab 01/31/2019 2115  AST 23  ALT 21  ALKPHOS 71  BILITOT 0.8  PROT 7.7  ALBUMIN 3.3*   No results for input(s): LIPASE, AMYLASE in the last 168 hours. No results for input(s): AMMONIA in the last 168 hours. Coagulation Profile: No results for input(s): INR, PROTIME in the last 168 hours. Cardiac Enzymes: No results for input(s): CKTOTAL, CKMB, CKMBINDEX, TROPONINI in the last 168 hours. BNP (last 3 results) No results for input(s): PROBNP in the last 8760 hours. HbA1C: No results for input(s): HGBA1C in the last 72 hours. CBG: No results for input(s): GLUCAP in the last 168 hours. Lipid Profile: No results for input(s): CHOL, HDL, LDLCALC, TRIG, CHOLHDL, LDLDIRECT in the last 72 hours. Thyroid Function Tests: No results for input(s): TSH, T4TOTAL, FREET4, T3FREE, THYROIDAB in the last 72 hours. Anemia Panel: No results for input(s): VITAMINB12, FOLATE, FERRITIN, TIBC, IRON, RETICCTPCT in the last 72 hours. Urine analysis:    Component Value Date/Time   COLORURINE BROWN (A) 01/25/2019 2329    APPEARANCEUR TURBID (A) 01/25/2019 2329   LABSPEC 1.020  2329   PHURINE 8.0 01/24/2019 2329   GLUCOSEU 100 (A) 01/09/2019 2329   HGBUR LARGE (A) 01/12/2019 2329   BILIRUBINUR SMALL (A) 01/29/2019 2329   KETONESUR 15 (A) 01/25/2019 2329   PROTEINUR >300 (A) 02/04/2019 2329   UROBILINOGEN 0.2 10/15/2014 1030   NITRITE NEGATIVE 01/17/2019 2329   LEUKOCYTESUR MODERATE (A) 02/07/2019 2329   Sepsis Labs: @LABRCNTIP (procalcitonin:4,lacticidven:4) ) Recent Results (from the past 240 hour(s))  Respiratory Panel by RT PCR (Flu A&B, Covid) - Nasopharyngeal Swab     Status: Abnormal   Collection Time: 02/05/2019 11:32 PM   Specimen: Nasopharyngeal Swab  Result Value Ref Range Status   SARS Coronavirus 2 by RT PCR POSITIVE (A) NEGATIVE Final    Comment: RESULT CALLED TO, READ BACK BY AND VERIFIED WITH: WOODY RN 01/09/19 0044 JDW (NOTE) SARS-CoV-2 target nucleic acids are DETECTED.  SARS-CoV-2 RNA is generally detectable in upper respiratory specimens  during the acute phase of infection. Positive results are indicative of the presence of the identified virus, but do not rule out bacterial infection or co-infection with other pathogens not detected by the test. Clinical correlation with patient history and other diagnostic information is necessary to determine patient infection status. The expected result is Negative. Fact Sheet for Patients:  PinkCheek.be Fact Sheet for Healthcare Providers: GravelBags.it This test is not yet approved or cleared by the [place] FDA and  has been authorized for detection and/or diagnosis of SARS-CoV-2 by FDA under an Emergency Use Authorization (EUA).  This EUA will remain in effect (meaning this test can be used) for the dura tion of  the COVID-19 declaration under Section 564(b)(1) of the Act, 21 U.S.C. section 360bbb-3(b)(1), unless the authorization is terminated or revoked sooner.     Influenza A by PCR NEGATIVE NEGATIVE Final   Influenza B by PCR NEGATIVE NEGATIVE Final    Comment: (NOTE) The Xpert Xpress SARS-CoV-2/FLU/RSV assay is intended as an aid in  the diagnosis of influenza from Nasopharyngeal swab specimens and  should not be used as a sole basis for treatment. Nasal washings and  aspirates are unacceptable for Xpert Xpress SARS-CoV-2/FLU/RSV  testing. Fact Sheet for Patients: PinkCheek.be Fact Sheet for Healthcare Providers: GravelBags.it This test is not yet approved or cleared by the [place] FDA and  has been authorized for detection and/or diagnosis of SARS-CoV-2 by  FDA under an Emergency Use Authorization (EUA). This EUA will remain  in effect (meaning this test can be used) for the duration of the  Covid-19 declaration under Section 564(b)(1) of the Act, 21  U.S.C. section 360bbb-3(b)(1), unless the authorization is  terminated or revoked. Performed at [facility], [place] [address], [place], [place] [address]      Radiological Exams on Admission: DG Chest Portable 1 View  Result Date: 01/15/2019 CLINICAL DATA:  COVID pneumonia. Shortness of breath. EXAM: PORTABLE CHEST 1 VIEW COMPARISON:  March 15, 2016 FINDINGS: Aortic calcifications are noted. The lungs appear hyperexpanded. There is no pneumothorax or large pleural effusion. No significant pulmonary infiltrates. There is no acute osseous abnormality. There is a compression fracture of the T12 vertebral body as seen on prior studies. IMPRESSION: 1. No active cardiopulmonary disease. 2. Aortic atherosclerosis. 3. Stable T12 vertebral body compression fracture. 4. Possible underlying COPD. Electronically Signed   By: Constance Holster M.D.   On: 01/25/2019 21:25    EKG: Independently reviewed.  Normal sinus rhythm.  Assessment/Plan Principal Problem:   ARF (acute renal failure) (HCC) Active Problems:   COVID-19 virus detected    Elevated troponin   Hypernatremia    1. Acute renal failure with severe hypernatremia and hypercalcemia likely from poor oral intake and severe dehydration.  Likely related to Covid infection. 2. COVID-19 infection. 3. Elevated troponin. 4. Leukocytosis. 5. Acute encephalopathy likely from severe dehydration with history of dementia and also possible UTI. 6. Possible UTI on ceftriaxone.  Plan -at this time I have discussed with patient's daughter Ms. Ermalinda Barrios who at this time advised only comfort measures and no further labs.  No further aggressive measures.  Okay to continue with IV fluids antibiotics and morphine if needed.  I have ordered morphine if needed for pain and shortness of breath and consult palliative care.  Presently not getting treatment for COVID-19 but will be treating her for dehydration and your possible UTI.  Given the acute change  in mental status with severe dehydration COVID-19 infection will need inpatient status.   DVT prophylaxis: Lovenox. Code Status: DNR confirmed with patient's daughter. Family Communication: Patient's daughter. Disposition Plan: May be terminal. Consults called: Palliative care. Admission status: Inpatient.   Rise Patience MD [facility] Pager [phone].  If 7PM-7AM, please contact night-coverage [email or web address] Password [email or web address]  01/09/2019, 2:26 AM

## 2019-01-09 NOTE — Progress Notes (Addendum)
I have seen and assessed patient and agree with Dr. Moise Boring assessment and plan.  Patient is a 86 year old female with history of advanced dementia, depression recently diagnosed with COVID-19 infection approximately 1 week prior to admission and has been doing poorly since then with fever chills decreased appetite and some diarrhea initially.  Patient noted to have worsening mental status with minimal response and transferred to the ED.  Patient seen in the ED and minimally responsive to noxious stimuli.  Urinalysis concerning for UTI.  Chest x-ray unremarkable.  Patient noted to have sats of 97% on room air at rest.  Patient noted to have elevated troponin which has subsequently been trending down, comprehensive metabolic profile obtained with a sodium of 162, chloride of 129, bicarb of 20, glucose of 129, creatinine of 1.79 otherwise was within normal limits.  CBC had a white count of 22.3.  Chest x-ray done was negative for any acute infiltrates.  Blood cultures ordered.  SARS coronavirus 2 PCR positive.  Continue IV Rocephin.  Change IV fluids to D5W.  Per admitting physician spoke with patient's daughter, Ms. Ermalinda Barrios who at this time and recommended comfort measures, no further labs, no further aggressive measures but was okay to continue with IV fluids and IV antibiotics and morphine if needed.  Palliative care ordered and is pending.  Spoke with daughter, Ms. Ermalinda Barrios who is in agreement with trial of IV fluids and IV antibiotics for the next 24 hours and if no significant improvement would like to shift towards full comfort measures.  No charge.

## 2019-01-09 NOTE — ED Notes (Signed)
Family updated by PA

## 2019-01-09 NOTE — ED Notes (Signed)
Paged MD about BP 185/64

## 2019-01-09 NOTE — ED Notes (Signed)
Daughter [name] called and update given.

## 2019-01-09 NOTE — ED Notes (Signed)
+  MS  Paged admitting floor coverage to make aware of high BP

## 2019-01-09 NOTE — ED Notes (Signed)
Patient was able to facetime with many family members.

## 2019-01-10 DIAGNOSIS — N3001 Acute cystitis with hematuria: Secondary | ICD-10-CM

## 2019-01-10 DIAGNOSIS — N179 Acute kidney failure, unspecified: Secondary | ICD-10-CM

## 2019-01-10 DIAGNOSIS — N390 Urinary tract infection, site not specified: Secondary | ICD-10-CM

## 2019-01-10 MED ORDER — MORPHINE SULFATE (PF) 2 MG/ML IV SOLN
1.0000 mg | INTRAVENOUS | Status: DC | PRN
  Administered 2019-01-12 – 2019-01-13 (×2): 1 mg via INTRAVENOUS
  Filled 2019-01-10 (×2): qty 1

## 2019-01-10 MED ORDER — CHLORHEXIDINE GLUCONATE 0.12 % MT SOLN
15.0000 mL | Freq: Two times a day (BID) | OROMUCOSAL | Status: DC
  Administered 2019-01-11 – 2019-01-13 (×6): 15 mL via OROMUCOSAL
  Filled 2019-01-10: qty 15

## 2019-01-10 MED ORDER — ORAL CARE MOUTH RINSE
15.0000 mL | Freq: Two times a day (BID) | OROMUCOSAL | Status: DC
  Administered 2019-01-10 – 2019-01-13 (×7): 15 mL via OROMUCOSAL

## 2019-01-10 MED ORDER — LORAZEPAM 2 MG/ML IJ SOLN
1.0000 mg | INTRAMUSCULAR | Status: DC | PRN
  Administered 2019-01-12: 1 mg via INTRAVENOUS
  Filled 2019-01-10: qty 1

## 2019-01-10 NOTE — Progress Notes (Addendum)
PROGRESS NOTE    Debra [name]  [id number] DOB: 1921-06-16 DOA: 02/04/2019 PCP: Patient, No Pcp Per  Brief Narrative: 86 y.o. female with history of advanced dementia and depression who was recently diagnosed with COVID-19 infection about 7 days ago has been doing poorly since then with fever chills poor appetite and also had some diarrhea initially.  Since patient mentation has progressively worsened with minimal response patient was transferred to the ER.  ED Course: In the ER patient was afebrile but on exam is minimally responsive to painful stimuli.  Labs reveal creatinine 1.7 sodium 162 chloride 129 calcium 10.5 troponin XI 100 WBC 23.3 EKG showed normal sinus rhythm with nonspecific changes chest x-ray unremarkable and Covid test positive but UA shows features concerning for UTI.  Patient was started on IV fluids for acute renal failure and hypernatremia and dehydration and ceftriaxone for UTI but at this time patient's daughter has wished only for her to be made comfortable.  No aggressive measures.   Assessment & Plan:   Principal Problem:   ARF (acute renal failure) (HCC) Active Problems:   COVID-19 virus detected   Elevated troponin   Hypernatremia   Palliative care patient   Dehydration   Acute lower UTI    #1 COVID-19 infection #2 acute renal failure #3 severe hypernatremia and hypercalcemia from decreased p.o. intake and severe dehydration #4 acute encephalopathy #5 UTI with Gran gram-negative rods Plan discussed with patient's daughter who is her healthcare power of attorney.  Updated patient's daughter regarding patient status. Patient's daughter expressed to me that patient would never wanted to live the way she is now.  She is a DO NOT RESUSCITATE.  And she wants her mother to be comfort care as that was mother's decision.  We will respect her wishes and keep her comfortable on comfort care. Morphine and Ativan started.  No lab draws no antibiotics no IV  fluids.   Estimated body mass index is 21.61 kg/m as calculated from the following:   Height as of this encounter: 5\' 3"  (1.6 m).   Weight as of 03/27/16: 55.3 kg.  DVT prophylaxis none Code Status: DNR/comfort care  Family Communication: Patient's daughter. Disposition Plan: comfort care Consults called: Palliative care.  Subjective:  Restless shaking, frail elderly female Objective: Vitals:   01/10/19 0301 01/10/19 0600 01/10/19 0812 01/10/19 1024  BP: (!) 174/77 (!) 179/64  (!) 164/120  Pulse: 87 68    Resp: (!) 22   18  Temp: 98.9 F (37.2 C)  (!) 97.4 F (36.3 C)   TempSrc: Axillary  Axillary   SpO2: 100%  (!) 89% 98%  Height:  5\' 3"  (1.6 m)      Intake/Output Summary (Last 24 hours) at 01/10/2019 1144 Last data filed at 01/10/2019 0347 Gross per 24 hour  Intake 2102.12 ml  Output --  Net 2102.12 ml   There were no vitals filed for this visit.  Examination: Oral mucosa very dry General exam: Appears restless and shaky Respiratory system: Clear to auscultation. Respiratory effort normal. Cardiovascular system: S1 & S2 heard, RRR. No JVD, murmurs, rubs, gallops or clicks. No pedal edema. Gastrointestinal system: Abdomen is nondistended, soft and nontender. No organomegaly or masses felt. Normal bowel sounds heard. Central nervous system: Alert and oriented. No focal neurological deficits. Extremities: Symmetric 5 x 5 power. Skin: No rashes, lesions or ulcers Psychiatry: Judgement and insight appear normal. Mood & affect appropriate.     Data Reviewed: I have personally reviewed following labs and  imaging studies  CBC: Recent Labs  Lab 01/30/2019 2115 01/09/19 0500  WBC 22.3* 21.1*  NEUTROABS 15.7*  --   HGB 13.0 11.3*  HCT 42.7 36.6  MCV 101.4* 101.7*  PLT 423* [id number]   Basic Metabolic Panel: Recent Labs  Lab 02/05/2019 2115 01/09/19 0500  NA 162*  --   K 3.6  --   CL 129*  --   CO2 20*  --   GLUCOSE 129*  --   BUN 104*  --   CREATININE 1.79* 1.41*   CALCIUM 10.5*  --    GFR: CrCl cannot be calculated (Unknown ideal weight.). Liver Function Tests: Recent Labs  Lab 02/06/2019 2115  AST 23  ALT 21  ALKPHOS 71  BILITOT 0.8  PROT 7.7  ALBUMIN 3.3*   No results for input(s): LIPASE, AMYLASE in the last 168 hours. No results for input(s): AMMONIA in the last 168 hours. Coagulation Profile: No results for input(s): INR, PROTIME in the last 168 hours. Cardiac Enzymes: No results for input(s): CKTOTAL, CKMB, CKMBINDEX, TROPONINI in the last 168 hours. BNP (last 3 results) No results for input(s): PROBNP in the last 8760 hours. HbA1C: No results for input(s): HGBA1C in the last 72 hours. CBG: No results for input(s): GLUCAP in the last 168 hours. Lipid Profile: No results for input(s): CHOL, HDL, LDLCALC, TRIG, CHOLHDL, LDLDIRECT in the last 72 hours. Thyroid Function Tests: No results for input(s): TSH, T4TOTAL, FREET4, T3FREE, THYROIDAB in the last 72 hours. Anemia Panel: No results for input(s): VITAMINB12, FOLATE, FERRITIN, TIBC, IRON, RETICCTPCT in the last 72 hours. Sepsis Labs: No results for input(s): PROCALCITON, LATICACIDVEN in the last 168 hours.  Recent Results (from the past 240 hour(s))  Culture, blood (routine x 2)     Status: None (Preliminary result)   Collection Time: 01/28/2019  9:15 PM   Specimen: BLOOD RIGHT FOREARM  Result Value Ref Range Status   Specimen Description BLOOD RIGHT FOREARM  Final   Special Requests   Final    BOTTLES DRAWN AEROBIC AND ANAEROBIC Blood Culture adequate volume   Culture   Final    NO GROWTH 1 DAY Performed at [facility], [address] [place], [place] [address]    Report Status PENDING  Incomplete  Urine culture     Status: Abnormal (Preliminary result)   Collection Time: 02/05/2019 11:29 PM   Specimen: Urine, Catheterized  Result Value Ref Range Status   Specimen Description URINE, CATHETERIZED  Final   Special Requests NONE  Final   Culture (A)  Final     >=100,000 COLONIES/mL GRAM NEGATIVE RODS IDENTIFICATION AND SUSCEPTIBILITIES TO FOLLOW Performed at [facility], [place] [address] [place], [place] [address]    Report Status PENDING  Incomplete  Respiratory Panel by RT PCR (Flu A&B, Covid) - Nasopharyngeal Swab     Status: Abnormal   Collection Time: 01/19/2019 11:32 PM   Specimen: Nasopharyngeal Swab  Result Value Ref Range Status   SARS Coronavirus 2 by RT PCR POSITIVE (A) NEGATIVE Final    Comment: RESULT CALLED TO, READ BACK BY AND VERIFIED WITH: WOODY RN 01/09/19 0044 JDW (NOTE) SARS-CoV-2 target nucleic acids are DETECTED. SARS-CoV-2 RNA is generally detectable in upper respiratory specimens  during the acute phase of infection. Positive results are indicative of the presence of the identified virus, but do not rule out bacterial infection or co-infection with other pathogens not detected by the test. Clinical correlation with patient history and other diagnostic  information is necessary to determine patient infection status. The expected result is Negative. Fact Sheet for Patients:  PinkCheek.be Fact Sheet for Healthcare Providers: GravelBags.it This test is not yet approved or cleared by the [place] FDA and  has been authorized for detection and/or diagnosis of SARS-CoV-2 by FDA under an Emergency Use Authorization (EUA).  This EUA will remain in effect (meaning this test can be used) for the dura tion of  the COVID-19 declaration under Section 564(b)(1) of the Act, 21 U.S.C. section 360bbb-3(b)(1), unless the authorization is terminated or revoked sooner.    Influenza A by PCR NEGATIVE NEGATIVE Final   Influenza B by PCR NEGATIVE NEGATIVE Final    Comment: (NOTE) The Xpert Xpress SARS-CoV-2/FLU/RSV assay is intended as an aid in  the diagnosis of influenza from Nasopharyngeal swab specimens and  should not be used as a sole basis for treatment. Nasal  washings and  aspirates are unacceptable for Xpert Xpress SARS-CoV-2/FLU/RSV  testing. Fact Sheet for Patients: PinkCheek.be Fact Sheet for Healthcare Providers: GravelBags.it This test is not yet approved or cleared by the [place] FDA and  has been authorized for detection and/or diagnosis of SARS-CoV-2 by  FDA under an Emergency Use Authorization (EUA). This EUA will remain  in effect (meaning this test can be used) for the duration of the  Covid-19 declaration under Section 564(b)(1) of the Act, 21  U.S.C. section 360bbb-3(b)(1), unless the authorization is  terminated or revoked. Performed at [facility], [place] [address] [place], [place] [address]   Culture, blood (routine x 2)     Status: None (Preliminary result)   Collection Time: 01/09/2019 11:34 PM   Specimen: BLOOD LEFT FOREARM  Result Value Ref Range Status   Specimen Description BLOOD LEFT FOREARM  Final   Special Requests   Final    BOTTLES DRAWN AEROBIC AND ANAEROBIC Blood Culture adequate volume   Culture   Final    NO GROWTH 1 DAY Performed at [facility], [place] [address] [place], [place] [address]    Report Status PENDING  Incomplete         Radiology Studies: DG Chest Portable 1 View  Result Date: 02/07/2019 CLINICAL DATA:  COVID pneumonia. Shortness of breath. EXAM: PORTABLE CHEST 1 VIEW COMPARISON:  March 15, 2016 FINDINGS: Aortic calcifications are noted. The lungs appear hyperexpanded. There is no pneumothorax or large pleural effusion. No significant pulmonary infiltrates. There is no acute osseous abnormality. There is a compression fracture of the T12 vertebral body as seen on prior studies. IMPRESSION: 1. No active cardiopulmonary disease. 2. Aortic atherosclerosis. 3. Stable T12 vertebral body compression fracture. 4. Possible underlying COPD. Electronically Signed   By: Constance Holster M.D.   On: 01/29/2019 21:25         Scheduled Meds: Continuous Infusions:   LOS: 1 day     Georgette Shell, MD [facility]  If 7PM-7AM, please contact night-coverage [email or web address] Password [facility] 01/10/2019, 11:44 AM

## 2019-01-10 NOTE — Progress Notes (Signed)
0300: Pt arrived on unit accompanied by Levada Dy RN from the ED. Skin assessed together; sacral wound with hydrocolloid dressing that appears to have been in place for a very long time - dressing is now incorporated into wound and these 2 staff members attempted to remove as much as possible without damage to pt's underlying skin. Pt also arrives with thick white coating to tongue and copious amounts of crusted black coating to lips, mouth, and throat. Mouth care given as pt allowed. Pt yells when touched and seems surprised; unable to discern orientation. New purewick in place - pt arrived with a heavy amount of tea colored urine to diaper and 2 underpads beneath her - strong, foul odor. Pt given bath.

## 2019-01-11 NOTE — Progress Notes (Signed)
PROGRESS NOTE    Debra [name]  [id number] DOB: 1921/02/15 DOA: 01/09/2019 PCP: Patient, No Pcp Per  Brief Narrative:86 y.o.femalewithhistory of advanced dementia and depression who was recently diagnosed with COVID-19 infection about 7 days ago has been doing poorly since then with fever chills poor appetite and also had some diarrhea initially. Since patient mentation has progressively worsened with minimal response patient was transferred to the ER.  ED Course:In the ER patient was afebrile but on exam is minimally responsive to painful stimuli. Labs reveal creatinine 1.7 sodium 162 chloride 129 calcium 10.5 troponin XI 100 WBC 23.3 EKG showed normal sinus rhythm with nonspecific changes chest x-ray unremarkable and Covid test positive but UA shows features concerning for UTI. Patient was started on IV fluids for acute renal failure and hypernatremiaand dehydration and ceftriaxone for UTI but at this time patient's daughter has wished only for her to be made comfortable. No aggressive measures.   Assessment & Plan:   Principal Problem:   ARF (acute renal failure) (HCC) Active Problems:   COVID-19 virus detected   Elevated troponin   Hypernatremia   Palliative care patient   Dehydration   Acute lower UTI   #1 COVID-19 infection #2 acute renal failure #3 severe hypernatremia and hypercalcemia from decreased p.o. intake and severe dehydration #4 acute encephalopathy #5 UTI with [place] gram-negative rods  Plan discussed with patient's daughter who is her healthcare power of attorney.  Updated patient's daughter regarding patient status.  Continue morphine and Ativan for comfort Patient's daughter expressed to me that patient would never wanted to live the way she is now.  She is a DO NOT RESUSCITATE.  And she wants her mother to be comfort care as that was mother's decision.  We will respect her wishes and keep her comfortable on comfort care. Morphine and Ativan started.   No lab draws no antibiotics no IV fluids.    Estimated body mass index is 21.61 kg/m as calculated from the following:   Height as of this encounter: 5\' 3"  (1.6 m).   Weight as of 03/27/16: 55.3 kg. DVT prophylaxis none Code Status:DNR/comfort care  Family Communication:Patient's daughter. Disposition Plan:comfort care Consults called:Palliative care  Subjective: Resting comfortably Staff has no acute new issues Patient's son visited her yesterday for 15 minutes  Objective: Vitals:   01/10/19 1100 01/10/19 1601 01/10/19 2329 01/11/19 1153  BP:  (!) 176/60 (!) 138/103 (!) 163/72  Pulse: 80  90 84  Resp:  18 (!) 21 20  Temp:  98.7 F (37.1 C)  98.8 F (37.1 C)  TempSrc:  Axillary    SpO2:  98% 99% 100%  Height:       No intake or output data in the 24 hours ending 01/11/19 1213 There were no vitals filed for this visit.  Examination:  General exam: Appears calm and comfortable  Respiratory system: Clear to auscultation. Respiratory effort normal. Cardiovascular system: S1 & S2 heard, RRR. No JVD, murmurs, rubs, gallops or clicks. No pedal edema. Gastrointestinal system: Abdomen is nondistended, soft and nontender. No organomegaly or masses felt. Normal bowel sounds heard. Central nervous system: Alert and oriented. No focal neurological deficits. Extremities: Symmetric 5 x 5 power. Skin: No rashes, lesions or ulcers Psychiatry: Judgement and insight appear normal. Mood & affect appropriate.     Data Reviewed: I have personally reviewed following labs and imaging studies  CBC: Recent Labs  Lab 01/12/2019 2115 01/09/19 0500  WBC 22.3* 21.1*  NEUTROABS 15.7*  --  HGB 13.0 11.3*  HCT 42.7 36.6  MCV 101.4* 101.7*  PLT 423* [id number]   Basic Metabolic Panel: Recent Labs  Lab 01/15/2019 2115 01/09/19 0500  NA 162*  --   K 3.6  --   CL 129*  --   CO2 20*  --   GLUCOSE 129*  --   BUN 104*  --   CREATININE 1.79* 1.41*  CALCIUM 10.5*  --    GFR: CrCl cannot  be calculated (Unknown ideal weight.). Liver Function Tests: Recent Labs  Lab 01/28/2019 2115  AST 23  ALT 21  ALKPHOS 71  BILITOT 0.8  PROT 7.7  ALBUMIN 3.3*   No results for input(s): LIPASE, AMYLASE in the last 168 hours. No results for input(s): AMMONIA in the last 168 hours. Coagulation Profile: No results for input(s): INR, PROTIME in the last 168 hours. Cardiac Enzymes: No results for input(s): CKTOTAL, CKMB, CKMBINDEX, TROPONINI in the last 168 hours. BNP (last 3 results) No results for input(s): PROBNP in the last 8760 hours. HbA1C: No results for input(s): HGBA1C in the last 72 hours. CBG: No results for input(s): GLUCAP in the last 168 hours. Lipid Profile: No results for input(s): CHOL, HDL, LDLCALC, TRIG, CHOLHDL, LDLDIRECT in the last 72 hours. Thyroid Function Tests: No results for input(s): TSH, T4TOTAL, FREET4, T3FREE, THYROIDAB in the last 72 hours. Anemia Panel: No results for input(s): VITAMINB12, FOLATE, FERRITIN, TIBC, IRON, RETICCTPCT in the last 72 hours. Sepsis Labs: No results for input(s): PROCALCITON, LATICACIDVEN in the last 168 hours.  Recent Results (from the past 240 hour(s))  Culture, blood (routine x 2)     Status: None (Preliminary result)   Collection Time: 01/12/2019  9:15 PM   Specimen: BLOOD RIGHT FOREARM  Result Value Ref Range Status   Specimen Description BLOOD RIGHT FOREARM  Final   Special Requests   Final    BOTTLES DRAWN AEROBIC AND ANAEROBIC Blood Culture adequate volume   Culture   Final    NO GROWTH 1 DAY Performed at [facility], [address] [place], [place] [address]    Report Status PENDING  Incomplete  Urine culture     Status: Abnormal (Preliminary result)   Collection Time: 01/26/2019 11:29 PM   Specimen: Urine, Catheterized  Result Value Ref Range Status   Specimen Description URINE, CATHETERIZED  Final   Special Requests NONE  Final   Culture (A)  Final    >=100,000 COLONIES/mL PROTEUS  MIRABILIS SUSCEPTIBILITIES TO FOLLOW CULTURE REINCUBATED FOR BETTER GROWTH Performed at [facility], [place] [address], [place], [place] [address]    Report Status PENDING  Incomplete  Respiratory Panel by RT PCR (Flu A&B, Covid) - Nasopharyngeal Swab     Status: Abnormal   Collection Time: 01/21/2019 11:32 PM   Specimen: Nasopharyngeal Swab  Result Value Ref Range Status   SARS Coronavirus 2 by RT PCR POSITIVE (A) NEGATIVE Final    Comment: RESULT CALLED TO, READ BACK BY AND VERIFIED WITH: WOODY RN 01/09/19 0044 JDW (NOTE) SARS-CoV-2 target nucleic acids are DETECTED. SARS-CoV-2 RNA is generally detectable in upper respiratory specimens  during the acute phase of infection. Positive results are indicative of the presence of the identified virus, but do not rule out bacterial infection or co-infection with other pathogens not detected by the test. Clinical correlation with patient history and other diagnostic information is necessary to determine patient infection status. The expected result is Negative. Fact Sheet for Patients:  PinkCheek.be Fact Sheet  for Healthcare Providers: GravelBags.it This test is not yet approved or cleared by the [place] and  has been authorized for detection and/or diagnosis of SARS-CoV-2 by FDA under an Emergency Use Authorization (EUA).  This EUA will remain in effect (meaning this test can be used) for the dura tion of  the COVID-19 declaration under Section 564(b)(1) of the Act, 21 U.S.C. section 360bbb-3(b)(1), unless the authorization is terminated or revoked sooner.    Influenza A by PCR NEGATIVE NEGATIVE Final   Influenza B by PCR NEGATIVE NEGATIVE Final    Comment: (NOTE) The Xpert Xpress SARS-CoV-2/FLU/RSV assay is intended as an aid in  the diagnosis of influenza from Nasopharyngeal swab specimens and  should not be used as a sole basis for treatment. Nasal washings and   aspirates are unacceptable for Xpert Xpress SARS-CoV-2/FLU/RSV  testing. Fact Sheet for Patients: PinkCheek.be Fact Sheet for Healthcare Providers: GravelBags.it This test is not yet approved or cleared by the [place] FDA and  has been authorized for detection and/or diagnosis of SARS-CoV-2 by  FDA under an Emergency Use Authorization (EUA). This EUA will remain  in effect (meaning this test can be used) for the duration of the  Covid-19 declaration under Section 564(b)(1) of the Act, 21  U.S.C. section 360bbb-3(b)(1), unless the authorization is  terminated or revoked. Performed at [facility], [place] [address] [place], [place] [address]   Culture, blood (routine x 2)     Status: None (Preliminary result)   Collection Time: 02/06/2019 11:34 PM   Specimen: BLOOD LEFT FOREARM  Result Value Ref Range Status   Specimen Description BLOOD LEFT FOREARM  Final   Special Requests   Final    BOTTLES DRAWN AEROBIC AND ANAEROBIC Blood Culture adequate volume   Culture   Final    NO GROWTH 1 DAY Performed at [facility], [place] [address] [place], [place] [address]    Report Status PENDING  Incomplete         Radiology Studies: No results found.      Scheduled Meds: . chlorhexidine  15 mL Mouth Rinse BID  . mouth rinse  15 mL Mouth Rinse q12n4p   Continuous Infusions:   LOS: 2 days     Georgette Shell, MD [facility]  If 7PM-7AM, please contact night-coverage [email or web address] Password [email or web address] 01/11/2019, 12:13 PM

## 2019-01-11 NOTE — Progress Notes (Signed)
Palliative Medicine RN Note: Discussed patient with Dr Hilma Favors. Note that her daughter has made the loving decision for comfort care only, and comfort care orders are already in place.  Unfortunately, Palliative Medicine Team is operating with a very high consult volume, and it's unclear when we will be able to see Mrs Debra [name]. If her daughter Debra [name] is interested in residential hospice care, Transition of Care order for residential hospice can be placed without our consult. [facility] in [place] has beds for COVID patients.  We will monitor her chart until we have an available provider. If an urgent palliative-related concern arises before then, please call our office.  Marjie Skiff Teah Votaw, RN, BSN, [facility] Palliative Medicine Team 01/11/2019 10:31 AM Office [phone]

## 2019-01-12 LAB — URINE CULTURE: Culture: >=100000 — AB

## 2019-01-12 NOTE — Progress Notes (Signed)
Patient given pain med for grimacing and moaning during change of position, peri care and mouth care

## 2019-01-12 NOTE — Progress Notes (Signed)
PROGRESS NOTE    Debra [name]  [id number] DOB: [birth year] DOA: 01/26/2019 PCP: Patient, No Pcp Per   Brief Narrative: 86 y.o.femalewithhistory of advanced dementia and depression who was recently diagnosed with COVID-19 infection about 7 days ago has been doing poorly since then with fever chills poor appetite and also had some diarrhea initially. Since patient mentation has progressively worsened with minimal response patient was transferred to the ER.  ED Course:In the ER patient was afebrile but on exam is minimally responsive to painful stimuli. Labs reveal creatinine 1.7 sodium 162 chloride 129 calcium 10.5 troponin XI 100 WBC 23.3 EKG showed normal sinus rhythm with nonspecific changes chest x-ray unremarkable and Covid test positive but UA shows features concerning for UTI. Patient was started on IV fluids for acute renal failure and hypernatremiaand dehydration and ceftriaxone for UTI but at this time patient's daughter has wished only for her to be made comfortable. No aggressive measures.   Assessment & Plan:   Principal Problem:   ARF (acute renal failure) (HCC) Active Problems:   COVID-19 virus detected   Elevated troponin   Hypernatremia   Palliative care patient   Dehydration   Acute lower UTI   #1 COVID-19 infection #2 acute renal failure #3 severe hypernatremia and hypercalcemia from decreased p.o. intake and severe dehydration #4 acute encephalopathy #5 UTI with [place] gram-negative rods Updated patient's daughter [name] about patient's status.  Patient not responding to commands questions she is laying in bed with her eyes closed.  Continue comfort care with morphine and Ativan   Estimated body mass index is 21.61 kg/m as calculated from the following:   Height as of this encounter: 5\' 3"  (1.6 m).   Weight as of 03/27/16: 55.3 kg.  DVT prophylaxisnone Code Status:DNR/comfort care Family Communication:Patient's daughter. Disposition  Plan:comfort care Consults called:Palliative care   Subjective:  Patient resting does not appear to be in any distress does not respond to voice or command or touch. Objective: Vitals:   01/10/19 1100 01/10/19 1601 01/10/19 2329 01/11/19 1153  BP:  (!) 176/60 (!) 138/103 (!) 163/72  Pulse: 80  90 84  Resp:  18 (!) 21 20  Temp:  98.7 F (37.1 C)  98.8 F (37.1 C)  TempSrc:  Axillary    SpO2:  98% 99% 100%  Height:        Intake/Output Summary (Last 24 hours) at 01/12/2019 1513 Last data filed at 01/12/2019 0530 Gross per 24 hour  Intake 0 ml  Output 300 ml  Net -300 ml   There were no vitals filed for this visit.  Examination:  General exam: Appears calm and comfortable  Respiratory system: Clear to auscultation. Respiratory effort normal. Cardiovascular system: S1 & S2 heard, RRR. No JVD, murmurs, rubs, gallops or clicks. No pedal edema. Gastrointestinal system: Abdomen is nondistended, soft and nontender. No organomegaly or masses felt. Normal bowel sounds heard. Central nervous system: Alert and oriented. No focal neurological deficits. Extremities: Symmetric 5 x 5 power. Skin: No rashes, lesions or ulcers Psychiatry: Judgement and insight appear normal. Mood & affect appropriate.     Data Reviewed: I have personally reviewed following labs and imaging studies  CBC: Recent Labs  Lab 01/17/2019 2115 01/09/19 0500  WBC 22.3* 21.1*  NEUTROABS 15.7*  --   HGB 13.0 11.3*  HCT 42.7 36.6  MCV 101.4* 101.7*  PLT 423* [id number]   Basic Metabolic Panel: Recent Labs  Lab 02/05/2019 2115 01/09/19 0500  NA 162*  --  K 3.6  --   CL 129*  --   CO2 20*  --   GLUCOSE 129*  --   BUN 104*  --   CREATININE 1.79* 1.41*  CALCIUM 10.5*  --    GFR: CrCl cannot be calculated (Unknown ideal weight.). Liver Function Tests: Recent Labs  Lab 02/06/2019 2115  AST 23  ALT 21  ALKPHOS 71  BILITOT 0.8  PROT 7.7  ALBUMIN 3.3*   No results for input(s): LIPASE, AMYLASE in the  last 168 hours. No results for input(s): AMMONIA in the last 168 hours. Coagulation Profile: No results for input(s): INR, PROTIME in the last 168 hours. Cardiac Enzymes: No results for input(s): CKTOTAL, CKMB, CKMBINDEX, TROPONINI in the last 168 hours. BNP (last 3 results) No results for input(s): PROBNP in the last 8760 hours. HbA1C: No results for input(s): HGBA1C in the last 72 hours. CBG: No results for input(s): GLUCAP in the last 168 hours. Lipid Profile: No results for input(s): CHOL, HDL, LDLCALC, TRIG, CHOLHDL, LDLDIRECT in the last 72 hours. Thyroid Function Tests: No results for input(s): TSH, T4TOTAL, FREET4, T3FREE, THYROIDAB in the last 72 hours. Anemia Panel: No results for input(s): VITAMINB12, FOLATE, FERRITIN, TIBC, IRON, RETICCTPCT in the last 72 hours. Sepsis Labs: No results for input(s): PROCALCITON, LATICACIDVEN in the last 168 hours.  Recent Results (from the past 240 hour(s))  Culture, blood (routine x 2)     Status: None (Preliminary result)   Collection Time: 02/04/2019  9:15 PM   Specimen: BLOOD RIGHT FOREARM  Result Value Ref Range Status   Specimen Description BLOOD RIGHT FOREARM  Final   Special Requests   Final    BOTTLES DRAWN AEROBIC AND ANAEROBIC Blood Culture adequate volume   Culture   Final    NO GROWTH 3 DAYS Performed at [facility], [address] [place], [place] [address]    Report Status PENDING  Incomplete  Urine culture     Status: Abnormal (Preliminary result)   Collection Time: 01/27/2019 11:29 PM   Specimen: Urine, Catheterized  Result Value Ref Range Status   Specimen Description URINE, CATHETERIZED  Final   Special Requests NONE  Final   Culture (A)  Final    >=100,000 COLONIES/mL PROTEUS MIRABILIS SUSCEPTIBILITIES TO FOLLOW Performed at [facility], [place] [address] [place], [place] [address]    Report Status PENDING  Incomplete  Respiratory Panel by RT PCR (Flu A&B, Covid) - Nasopharyngeal Swab     Status:  Abnormal   Collection Time: 02/04/2019 11:32 PM   Specimen: Nasopharyngeal Swab  Result Value Ref Range Status   SARS Coronavirus 2 by RT PCR POSITIVE (A) NEGATIVE Final    Comment: RESULT CALLED TO, READ BACK BY AND VERIFIED WITH: WOODY RN 01/09/19 0044 JDW (NOTE) SARS-CoV-2 target nucleic acids are DETECTED. SARS-CoV-2 RNA is generally detectable in upper respiratory specimens  during the acute phase of infection. Positive results are indicative of the presence of the identified virus, but do not rule out bacterial infection or co-infection with other pathogens not detected by the test. Clinical correlation with patient history and other diagnostic information is necessary to determine patient infection status. The expected result is Negative. Fact Sheet for Patients:  PinkCheek.be Fact Sheet for Healthcare Providers: GravelBags.it This test is not yet approved or cleared by the [place] FDA and  has been authorized for detection and/or diagnosis of SARS-CoV-2 by FDA under an Emergency Use Authorization (EUA).  This EUA will  remain in effect (meaning this test can be used) for the dura tion of  the COVID-19 declaration under Section 564(b)(1) of the Act, 21 U.S.C. section 360bbb-3(b)(1), unless the authorization is terminated or revoked sooner.    Influenza A by PCR NEGATIVE NEGATIVE Final   Influenza B by PCR NEGATIVE NEGATIVE Final    Comment: (NOTE) The Xpert Xpress SARS-CoV-2/FLU/RSV assay is intended as an aid in  the diagnosis of influenza from Nasopharyngeal swab specimens and  should not be used as a sole basis for treatment. Nasal washings and  aspirates are unacceptable for Xpert Xpress SARS-CoV-2/FLU/RSV  testing. Fact Sheet for Patients: PinkCheek.be Fact Sheet for Healthcare Providers: GravelBags.it This test is not yet approved or cleared by the  [place] FDA and  has been authorized for detection and/or diagnosis of SARS-CoV-2 by  FDA under an Emergency Use Authorization (EUA). This EUA will remain  in effect (meaning this test can be used) for the duration of the  Covid-19 declaration under Section 564(b)(1) of the Act, 21  U.S.C. section 360bbb-3(b)(1), unless the authorization is  terminated or revoked. Performed at [facility], [place] [address] [place], [place] [address]   Culture, blood (routine x 2)     Status: None (Preliminary result)   Collection Time: 01/22/2019 11:34 PM   Specimen: BLOOD LEFT FOREARM  Result Value Ref Range Status   Specimen Description BLOOD LEFT FOREARM  Final   Special Requests   Final    BOTTLES DRAWN AEROBIC AND ANAEROBIC Blood Culture adequate volume   Culture   Final    NO GROWTH 3 DAYS Performed at [facility], [place] [address] [place], [place] [address]    Report Status PENDING  Incomplete         Radiology Studies: No results found.      Scheduled Meds: . chlorhexidine  15 mL Mouth Rinse BID  . mouth rinse  15 mL Mouth Rinse q12n4p   Continuous Infusions:   LOS: 3 days     Georgette Shell, MD [facility]  If 7PM-7AM, please contact night-coverage [email or web address] Password [facility] 01/12/2019, 3:13 PM

## 2019-01-14 LAB — CULTURE, BLOOD (ROUTINE X 2)
Culture: NO GROWTH
Culture: NO GROWTH
Special Requests: ADEQUATE
Special Requests: ADEQUATE

## 2019-02-08 NOTE — Progress Notes (Signed)
Pt pronounced deceased at 2147. Verified by 2nd RN Debra [name]). Debra [name] contacted, signed death certificate. Daughter contacted as well Debra [name]). Daughter provided funeral services number and name.

## 2019-02-08 NOTE — Discharge Summary (Signed)
Physician Discharge Summary  Akara Needles [id number] DOB: 08/29/1921 DOA: 01/22/2019  PCP: Patient, No Pcp Per  Admit date: 01/25/2019 Discharge date: 01/15/2019   Code Status: Prior  Discharge Condition: Deceased  Hospital Summary  Brief Narrative: 86 y.o.femalewithhistory of advanced dementia and depression who was recently diagnosed with COVID-19 infection about 7 days prior to admission had been doing poorly since then with fever chills poor appetite and also had some diarrhea initially. Since patient mentation had progressively worsened with minimal response patient was transferred to the ER.  ED Course:In the ER patient was afebrile but on exam is minimally responsive to painful stimuli. Labs reveal creatinine 1.7 sodium 162 chloride 129 calcium 10.5 troponin XI 100 WBC 23.3 EKG showed normal sinus rhythm with nonspecific changes chest x-ray unremarkable and Covid test positive but UA shows features concerning for UTI. Patient was started on IV fluids for acute renal failure and hypernatremiaand dehydration and ceftriaxone for UTI but at this time patient's daughter has wished only for her to be made comfortable on 1/3.  Patient was pronounced deceased at 2145/04/17 on Jan 29, 2022 by 04-12-22.  A & P   Principal Problem:   ARF (acute renal failure) (HCC) Active Problems:   COVID-19 virus detected   Elevated troponin   Hypernatremia   Palliative care patient   Dehydration   Acute lower UTI   1. COVID-19 2. AKI 3. Severe hypernatremia and hypercalcemia from decreased p.o. intake and severe dehydration 4. Acute encephalopathy secondary to above 5. Proteus mirabilis UTI .   Antibiotics   Anti-infectives (From admission, onward)   Start     Dose/Rate Route Frequency Ordered Stop   01/09/19 2200  cefTRIAXone (ROCEPHIN) 1 g in sodium chloride 0.9 % 100 mL IVPB  Status:  Discontinued     1 g 200 mL/hr over 30 Minutes Intravenous Every 24 hours 01/09/19 0225 01/10/19 1143   01/09/19 0030   cefTRIAXone (ROCEPHIN) 1 g in sodium chloride 0.9 % 100 mL IVPB     1 g 200 mL/hr over 30 Minutes Intravenous  Once 01/09/19 0020 01/09/19 0215      Objective   Discharge Exam: Vitals:   01/10/19 2329 01/11/19 1153  BP: (!) 138/103 (!) 163/72  Pulse: 90 84  Resp: (!) 21 20  Temp:  98.8 F (37.1 C)  SpO2: 99% 100%   Vitals:   01/10/19 1601 01/10/19 2329 01/11/19 1153 30-Jan-2019 April 18, 2147  BP: (!) 176/60 (!) 138/103 (!) 163/72   Pulse:  90 84   Resp: 18 (!) 21 20   Temp: 98.7 F (37.1 C)  98.8 F (37.1 C)   TempSrc: Axillary     SpO2: 98% 99% 100%   Weight:    63.5 kg  Height:    5\' 3"  (1.6 m)    Physical Exam not performed by myself as patient was pronounced deceased overnight and transported off floor prior to my arrival.    The results of significant diagnostics from this hospitalization (including imaging, microbiology, ancillary and laboratory) are listed below for reference.     Microbiology: Recent Results (from the past 240 hour(s))  Culture, blood (routine x 2)     Status: None   Collection Time: 01/20/2019  9:15 PM   Specimen: BLOOD RIGHT FOREARM  Result Value Ref Range Status   Specimen Description BLOOD RIGHT FOREARM  Final   Special Requests   Final    BOTTLES DRAWN AEROBIC AND ANAEROBIC Blood Culture adequate volume   Culture   Final  NO GROWTH 5 DAYS Performed at [facility], [place] [address], [place], [place] [address]    Report Status  FINAL  Final  Urine culture     Status: Abnormal   Collection Time: 01/18/2019 11:29 PM   Specimen: Urine, Catheterized  Result Value Ref Range Status   Specimen Description URINE, CATHETERIZED  Final   Special Requests   Final    NONE Performed at [facility], [place] [address], [place], [place] [address]    Culture >=100,000 COLONIES/mL PROTEUS MIRABILIS (A)  Final   Report Status 01/12/2019 FINAL  Final   Organism ID, Bacteria PROTEUS MIRABILIS (A)  Final      Susceptibility   Proteus  mirabilis - MIC*    AMPICILLIN <=2 SENSITIVE Sensitive     CEFAZOLIN <=4 SENSITIVE Sensitive     CEFTRIAXONE <=0.25 SENSITIVE Sensitive     CIPROFLOXACIN 2 INTERMEDIATE Intermediate     GENTAMICIN <=1 SENSITIVE Sensitive     IMIPENEM 2 SENSITIVE Sensitive     NITROFURANTOIN RESISTANT Resistant     TRIMETH/SULFA <=20 SENSITIVE Sensitive     AMPICILLIN/SULBACTAM <=2 SENSITIVE Sensitive     PIP/TAZO <=4 SENSITIVE Sensitive     * >=100,000 COLONIES/mL PROTEUS MIRABILIS  Respiratory Panel by RT PCR (Flu A&B, Covid) - Nasopharyngeal Swab     Status: Abnormal   Collection Time: 02/04/2019 11:32 PM   Specimen: Nasopharyngeal Swab  Result Value Ref Range Status   SARS Coronavirus 2 by RT PCR POSITIVE (A) NEGATIVE Final    Comment: RESULT CALLED TO, READ BACK BY AND VERIFIED WITH: WOODY RN 01/09/19 0044 JDW (NOTE) SARS-CoV-2 target nucleic acids are DETECTED. SARS-CoV-2 RNA is generally detectable in upper respiratory specimens  during the acute phase of infection. Positive results are indicative of the presence of the identified virus, but do not rule out bacterial infection or co-infection with other pathogens not detected by the test. Clinical correlation with patient history and other diagnostic information is necessary to determine patient infection status. The expected result is Negative. Fact Sheet for Patients:  PinkCheek.be Fact Sheet for Healthcare Providers: GravelBags.it This test is not yet approved or cleared by the [place] FDA and  has been authorized for detection and/or diagnosis of SARS-CoV-2 by FDA under an Emergency Use Authorization (EUA).  This EUA will remain in effect (meaning this test can be used) for the dura tion of  the COVID-19 declaration under Section 564(b)(1) of the Act, 21 U.S.C. section 360bbb-3(b)(1), unless the authorization is terminated or revoked sooner.    Influenza A by PCR NEGATIVE  NEGATIVE Final   Influenza B by PCR NEGATIVE NEGATIVE Final    Comment: (NOTE) The Xpert Xpress SARS-CoV-2/FLU/RSV assay is intended as an aid in  the diagnosis of influenza from Nasopharyngeal swab specimens and  should not be used as a sole basis for treatment. Nasal washings and  aspirates are unacceptable for Xpert Xpress SARS-CoV-2/FLU/RSV  testing. Fact Sheet for Patients: PinkCheek.be Fact Sheet for Healthcare Providers: GravelBags.it This test is not yet approved or cleared by the [place] FDA and  has been authorized for detection and/or diagnosis of SARS-CoV-2 by  FDA under an Emergency Use Authorization (EUA). This EUA will remain  in effect (meaning this test can be used) for the duration of the  Covid-19 declaration under Section 564(b)(1) of the Act, 21  U.S.C. section 360bbb-3(b)(1), unless the authorization is  terminated or revoked. Performed at [facility], [place] [address]  [place], [place] [address]   Culture, blood (routine x 2)     Status: None   Collection Time: 02/07/2019 11:34 PM   Specimen: BLOOD LEFT FOREARM  Result Value Ref Range Status   Specimen Description BLOOD LEFT FOREARM  Final   Special Requests   Final    BOTTLES DRAWN AEROBIC AND ANAEROBIC Blood Culture adequate volume   Culture   Final    NO GROWTH 5 DAYS Performed at [facility], [place] [address] [place], [place] [address]    Report Status  FINAL  Final     Labs: BNP (last 3 results) No results for input(s): BNP in the last 8760 hours. Basic Metabolic Panel: Recent Labs  Lab 01/12/2019 2115 01/09/19 0500  NA 162*  --   K 3.6  --   CL 129*  --   CO2 20*  --   GLUCOSE 129*  --   BUN 104*  --   CREATININE 1.79* 1.41*  CALCIUM 10.5*  --    Liver Function Tests: Recent Labs  Lab 01/23/2019 2115  AST 23  ALT 21  ALKPHOS 71  BILITOT 0.8  PROT 7.7  ALBUMIN 3.3*   No results for input(s): LIPASE,  AMYLASE in the last 168 hours. No results for input(s): AMMONIA in the last 168 hours. CBC: Recent Labs  Lab 01/29/2019 2115 01/09/19 0500  WBC 22.3* 21.1*  NEUTROABS 15.7*  --   HGB 13.0 11.3*  HCT 42.7 36.6  MCV 101.4* 101.7*  PLT 423* 339   Cardiac Enzymes: No results for input(s): CKTOTAL, CKMB, CKMBINDEX, TROPONINI in the last 168 hours. BNP: Invalid input(s): POCBNP CBG: No results for input(s): GLUCAP in the last 168 hours. D-Dimer No results for input(s): DDIMER in the last 72 hours. Hgb A1c No results for input(s): HGBA1C in the last 72 hours. Lipid Profile No results for input(s): CHOL, HDL, LDLCALC, TRIG, CHOLHDL, LDLDIRECT in the last 72 hours. Thyroid function studies No results for input(s): TSH, T4TOTAL, T3FREE, THYROIDAB in the last 72 hours.  Invalid input(s): FREET3 Anemia work up No results for input(s): VITAMINB12, FOLATE, FERRITIN, TIBC, IRON, RETICCTPCT in the last 72 hours. Urinalysis    Component Value Date/Time   COLORURINE BROWN (A) 01/18/2019 2329   APPEARANCEUR TURBID (A) 01/27/2019 2329   LABSPEC 1.020 02/02/2019 2329   PHURINE 8.0 02/01/2019 2329   GLUCOSEU 100 (A) 01/17/2019 2329   HGBUR LARGE (A) 01/15/2019 2329   BILIRUBINUR SMALL (A) 01/23/2019 2329   KETONESUR 15 (A) 01/28/2019 2329   PROTEINUR >300 (A) 01/20/2019 2329   UROBILINOGEN 0.2 10/15/2014 1030   NITRITE NEGATIVE 01/25/2019 2329   LEUKOCYTESUR MODERATE (A) 01/16/2019 2329   Sepsis Labs Invalid input(s): PROCALCITONIN,  WBC,  LACTICIDVEN Microbiology Recent Results (from the past 240 hour(s))  Culture, blood (routine x 2)     Status: None   Collection Time: 01/09/2019  9:15 PM   Specimen: BLOOD RIGHT FOREARM  Result Value Ref Range Status   Specimen Description BLOOD RIGHT FOREARM  Final   Special Requests   Final    BOTTLES DRAWN AEROBIC AND ANAEROBIC Blood Culture adequate volume   Culture   Final    NO GROWTH 5 DAYS Performed at [facility], [address] [place], [place] [address]    Report Status  FINAL  Final  Urine culture     Status: Abnormal   Collection Time: 01/12/2019 11:29 PM   Specimen: Urine, Catheterized  Result Value Ref Range  Status   Specimen Description URINE, CATHETERIZED  Final   Special Requests   Final    NONE Performed at [facility], [place] [address] [place], [place] [address]    Culture >=100,000 COLONIES/mL PROTEUS MIRABILIS (A)  Final   Report Status 01/12/2019 FINAL  Final   Organism ID, Bacteria PROTEUS MIRABILIS (A)  Final      Susceptibility   Proteus mirabilis - MIC*    AMPICILLIN <=2 SENSITIVE Sensitive     CEFAZOLIN <=4 SENSITIVE Sensitive     CEFTRIAXONE <=0.25 SENSITIVE Sensitive     CIPROFLOXACIN 2 INTERMEDIATE Intermediate     GENTAMICIN <=1 SENSITIVE Sensitive     IMIPENEM 2 SENSITIVE Sensitive     NITROFURANTOIN RESISTANT Resistant     TRIMETH/SULFA <=20 SENSITIVE Sensitive     AMPICILLIN/SULBACTAM <=2 SENSITIVE Sensitive     PIP/TAZO <=4 SENSITIVE Sensitive     * >=100,000 COLONIES/mL PROTEUS MIRABILIS  Respiratory Panel by RT PCR (Flu A&B, Covid) - Nasopharyngeal Swab     Status: Abnormal   Collection Time: 02/02/2019 11:32 PM   Specimen: Nasopharyngeal Swab  Result Value Ref Range Status   SARS Coronavirus 2 by RT PCR POSITIVE (A) NEGATIVE Final    Comment: RESULT CALLED TO, READ BACK BY AND VERIFIED WITH: WOODY RN 01/09/19 0044 JDW (NOTE) SARS-CoV-2 target nucleic acids are DETECTED. SARS-CoV-2 RNA is generally detectable in upper respiratory specimens  during the acute phase of infection. Positive results are indicative of the presence of the identified virus, but do not rule out bacterial infection or co-infection with other pathogens not detected by the test. Clinical correlation with patient history and other diagnostic information is necessary to determine patient infection status. The expected result is Negative. Fact Sheet for Patients:   PinkCheek.be Fact Sheet for Healthcare Providers: GravelBags.it This test is not yet approved or cleared by the [place] FDA and  has been authorized for detection and/or diagnosis of SARS-CoV-2 by FDA under an Emergency Use Authorization (EUA).  This EUA will remain in effect (meaning this test can be used) for the dura tion of  the COVID-19 declaration under Section 564(b)(1) of the Act, 21 U.S.C. section 360bbb-3(b)(1), unless the authorization is terminated or revoked sooner.    Influenza A by PCR NEGATIVE NEGATIVE Final   Influenza B by PCR NEGATIVE NEGATIVE Final    Comment: (NOTE) The Xpert Xpress SARS-CoV-2/FLU/RSV assay is intended as an aid in  the diagnosis of influenza from Nasopharyngeal swab specimens and  should not be used as a sole basis for treatment. Nasal washings and  aspirates are unacceptable for Xpert Xpress SARS-CoV-2/FLU/RSV  testing. Fact Sheet for Patients: PinkCheek.be Fact Sheet for Healthcare Providers: GravelBags.it This test is not yet approved or cleared by the [place] FDA and  has been authorized for detection and/or diagnosis of SARS-CoV-2 by  FDA under an Emergency Use Authorization (EUA). This EUA will remain  in effect (meaning this test can be used) for the duration of the  Covid-19 declaration under Section 564(b)(1) of the Act, 21  U.S.C. section 360bbb-3(b)(1), unless the authorization is  terminated or revoked. Performed at [facility], [place] [address] [place],  [address]   Culture, blood (routine x 2)     Status: None   Collection Time: 01/24/2019 11:34 PM   Specimen: BLOOD LEFT FOREARM  Result Value Ref Range Status   Specimen Description BLOOD LEFT FOREARM  Final   Special Requests   Final  BOTTLES DRAWN AEROBIC AND ANAEROBIC Blood Culture adequate volume   Culture   Final    NO GROWTH 5  DAYS Performed at [facility], [place] [address] [place], [place] [address]    Report Status  FINAL  Final    Discharge Instructions      Allergies as of 01-19-19   No Known Allergies     Medication List    ASK your doctor about these medications   acetaminophen 500 MG tablet Commonly known as: TYLENOL One every 4 hours if needed for pain   dexamethasone 6 MG tablet Commonly known as: DECADRON Take 6 mg by mouth.   enoxaparin 40 MG/0.4ML injection Commonly known as: LOVENOX Inject 40 mg into the skin at bedtime.   escitalopram 10 MG tablet Commonly known as: Lexapro 1 daily to help anxiety and depression   Melatonin 3 MG Tabs Take 6 mg by mouth at bedtime. Ask about: Which instructions should I use?   Melatonin 5 MG/ML Liqd Take 1 mL by mouth at bedtime. Ask about: Which instructions should I use?   NUTRITIONAL SUPPLEMENT PO Take 90 mLs by mouth 3 (three) times daily. MedPass 2.0   OcuSoft Eyelid Cleansing Pads Place 1 application into both eyes daily.   Systane Balance 0.6 % Soln Generic drug: Propylene Glycol Place 1 drop into both eyes at bedtime.   Vitamin D 50 MCG (2000 UT) tablet Take 2,000 Units by mouth daily.       No Known Allergies  Time coordinating discharge: under 30 minutes  SIGNED:   Harold Hedge, D.O. [facility] Pager: [phone]  01/15/2019, 6:27 PM

## 2019-02-08 NOTE — Progress Notes (Signed)
PROGRESS NOTE    Crispina Sheffield    Code Status: DNR  AS:[id number] DOB: 05/03/1921 DOA: 01/10/2019  PCP: Patient, No Pcp Per    Hospital Summary  This is a 86 year old female with history of advanced dementia, depression, recent COVID-19 diagnosis 1 week prior to admission who presented with fever, chills, decreased appetite, diarrhea and encephalopathy.  Found to have UTI and minimal responsiveness to painful stimuli on admission.  Initially started treatment in ED however changed to comfort measures at request of family.  A & P   Principal Problem:   ARF (acute renal failure) (HCC) Active Problems:   COVID-19 virus detected   Elevated troponin   Hypernatremia   Palliative care patient   Dehydration   Acute lower UTI   1. COVID-19 2. AKI 3. Severe hypernatremia and hypercalcemia from decreased p.o. intake and severe dehydration 4. Acute encephalopathy secondary to above 5. Proteus mirabilis UTI  Plan: Continue comfort care with morphine and Ativan  DVT prophylaxis: None Family Communication: Patient's daughter has been updated  Disposition Plan: Comfort measures  Consultants  None  Procedures  None  Antibiotics   Anti-infectives (From admission, onward)   Start     Dose/Rate Route Frequency Ordered Stop   01/09/19 2200  cefTRIAXone (ROCEPHIN) 1 g in sodium chloride 0.9 % 100 mL IVPB  Status:  Discontinued     1 g 200 mL/hr over 30 Minutes Intravenous Every 24 hours 01/09/19 0225 01/10/19 1143   01/09/19 0030  cefTRIAXone (ROCEPHIN) 1 g in sodium chloride 0.9 % 100 mL IVPB     1 g 200 mL/hr over 30 Minutes Intravenous  Once 01/09/19 0020 01/09/19 0215           Subjective   Patient seen and examined sleeping comfortably in no acute distress.  Music on at bedside  Objective   Vitals:   01/10/19 1100 01/10/19 1601 01/10/19 2329 01/11/19 1153  BP:  (!) 176/60 (!) 138/103 (!) 163/72  Pulse: 80  90 84  Resp:  18 (!) 21 20  Temp:  98.7 F (37.1 C)  98.8  F (37.1 C)  TempSrc:  Axillary    SpO2:  98% 99% 100%  Height:       No intake or output data in the 24 hours ending Jan 22, 2019 1558 There were no vitals filed for this visit.  Examination:  Physical Exam Vitals and nursing note reviewed.  Constitutional:      Comments: Sleeping, comfortable frail elderly female  HENT:     Head: Normocephalic.  Eyes:     Comments: Eyes closed  Pulmonary:     Effort: Pulmonary effort is normal. No respiratory distress.  Skin:    Coloration: Skin is not jaundiced.  Neurological:     Comments: At new baseline     Data Reviewed: I have personally reviewed following labs and imaging studies  CBC: Recent Labs  Lab 02/02/2019 2115 01/09/19 0500  WBC 22.3* 21.1*  NEUTROABS 15.7*  --   HGB 13.0 11.3*  HCT 42.7 36.6  MCV 101.4* 101.7*  PLT 423* [id number]   Basic Metabolic Panel: Recent Labs  Lab 01/22/2019 2115 01/09/19 0500  NA 162*  --   K 3.6  --   CL 129*  --   CO2 20*  --   GLUCOSE 129*  --   BUN 104*  --   CREATININE 1.79* 1.41*  CALCIUM 10.5*  --    GFR: CrCl cannot be calculated (Unknown ideal weight.). Liver Function  Tests: Recent Labs  Lab 01/18/2019 2115  AST 23  ALT 21  ALKPHOS 71  BILITOT 0.8  PROT 7.7  ALBUMIN 3.3*   No results for input(s): LIPASE, AMYLASE in the last 168 hours. No results for input(s): AMMONIA in the last 168 hours. Coagulation Profile: No results for input(s): INR, PROTIME in the last 168 hours. Cardiac Enzymes: No results for input(s): CKTOTAL, CKMB, CKMBINDEX, TROPONINI in the last 168 hours. BNP (last 3 results) No results for input(s): PROBNP in the last 8760 hours. HbA1C: No results for input(s): HGBA1C in the last 72 hours. CBG: No results for input(s): GLUCAP in the last 168 hours. Lipid Profile: No results for input(s): CHOL, HDL, LDLCALC, TRIG, CHOLHDL, LDLDIRECT in the last 72 hours. Thyroid Function Tests: No results for input(s): TSH, T4TOTAL, FREET4, T3FREE, THYROIDAB in the  last 72 hours. Anemia Panel: No results for input(s): VITAMINB12, FOLATE, FERRITIN, TIBC, IRON, RETICCTPCT in the last 72 hours. Sepsis Labs: No results for input(s): PROCALCITON, LATICACIDVEN in the last 168 hours.  Recent Results (from the past 240 hour(s))  Culture, blood (routine x 2)     Status: None (Preliminary result)   Collection Time: 02/05/2019  9:15 PM   Specimen: BLOOD RIGHT FOREARM  Result Value Ref Range Status   Specimen Description BLOOD RIGHT FOREARM  Final   Special Requests   Final    BOTTLES DRAWN AEROBIC AND ANAEROBIC Blood Culture adequate volume   Culture   Final    NO GROWTH 4 DAYS Performed at [facility], [address], [place], [place] [address]    Report Status PENDING  Incomplete  Urine culture     Status: Abnormal   Collection Time: 02/01/2019 11:29 PM   Specimen: Urine, Catheterized  Result Value Ref Range Status   Specimen Description URINE, CATHETERIZED  Final   Special Requests   Final    NONE Performed at [facility], [address] [place], [place] [address]    Culture >=100,000 COLONIES/mL PROTEUS MIRABILIS (A)  Final   Report Status 01/12/2019 FINAL  Final   Organism ID, Bacteria PROTEUS MIRABILIS (A)  Final      Susceptibility   Proteus mirabilis - MIC*    AMPICILLIN <=2 SENSITIVE Sensitive     CEFAZOLIN <=4 SENSITIVE Sensitive     CEFTRIAXONE <=0.25 SENSITIVE Sensitive     CIPROFLOXACIN 2 INTERMEDIATE Intermediate     GENTAMICIN <=1 SENSITIVE Sensitive     IMIPENEM 2 SENSITIVE Sensitive     NITROFURANTOIN RESISTANT Resistant     TRIMETH/SULFA <=20 SENSITIVE Sensitive     AMPICILLIN/SULBACTAM <=2 SENSITIVE Sensitive     PIP/TAZO <=4 SENSITIVE Sensitive     * >=100,000 COLONIES/mL PROTEUS MIRABILIS  Respiratory Panel by RT PCR (Flu A&B, Covid) - Nasopharyngeal Swab     Status: Abnormal   Collection Time: 01/18/2019 11:32 PM   Specimen: Nasopharyngeal Swab  Result Value Ref Range Status   SARS Coronavirus 2 by RT PCR  POSITIVE (A) NEGATIVE Final    Comment: RESULT CALLED TO, READ BACK BY AND VERIFIED WITH: WOODY RN 01/09/19 0044 JDW (NOTE) SARS-CoV-2 target nucleic acids are DETECTED. SARS-CoV-2 RNA is generally detectable in upper respiratory specimens  during the acute phase of infection. Positive results are indicative of the presence of the identified virus, but do not rule out bacterial infection or co-infection with other pathogens not detected by the test. Clinical correlation with patient history and other diagnostic information is necessary to determine patient  infection status. The expected result is Negative. Fact Sheet for Patients:  PinkCheek.be Fact Sheet for Healthcare Providers: GravelBags.it This test is not yet approved or cleared by the [place] FDA and  has been authorized for detection and/or diagnosis of SARS-CoV-2 by FDA under an Emergency Use Authorization (EUA).  This EUA will remain in effect (meaning this test can be used) for the dura tion of  the COVID-19 declaration under Section 564(b)(1) of the Act, 21 U.S.C. section 360bbb-3(b)(1), unless the authorization is terminated or revoked sooner.    Influenza A by PCR NEGATIVE NEGATIVE Final   Influenza B by PCR NEGATIVE NEGATIVE Final    Comment: (NOTE) The Xpert Xpress SARS-CoV-2/FLU/RSV assay is intended as an aid in  the diagnosis of influenza from Nasopharyngeal swab specimens and  should not be used as a sole basis for treatment. Nasal washings and  aspirates are unacceptable for Xpert Xpress SARS-CoV-2/FLU/RSV  testing. Fact Sheet for Patients: PinkCheek.be Fact Sheet for Healthcare Providers: GravelBags.it This test is not yet approved or cleared by the [place] FDA and  has been authorized for detection and/or diagnosis of SARS-CoV-2 by  FDA under an Emergency Use Authorization (EUA). This  EUA will remain  in effect (meaning this test can be used) for the duration of the  Covid-19 declaration under Section 564(b)(1) of the Act, 21  U.S.C. section 360bbb-3(b)(1), unless the authorization is  terminated or revoked. Performed at [facility], [place] [address], [place], [place] [address]   Culture, blood (routine x 2)     Status: None (Preliminary result)   Collection Time: 01/30/2019 11:34 PM   Specimen: BLOOD LEFT FOREARM  Result Value Ref Range Status   Specimen Description BLOOD LEFT FOREARM  Final   Special Requests   Final    BOTTLES DRAWN AEROBIC AND ANAEROBIC Blood Culture adequate volume   Culture   Final    NO GROWTH 4 DAYS Performed at [facility], [place] [address], [place], [place] [address]    Report Status PENDING  Incomplete         Radiology Studies: No results found.      Scheduled Meds: . chlorhexidine  15 mL Mouth Rinse BID  . mouth rinse  15 mL Mouth Rinse q12n4p   Continuous Infusions:   LOS: 4 days    Time spent: 15 minutes with over 50% of the time coordinating the patient's care    Harold Hedge, DO [facility] Pager [phone]  If 7PM-7AM, please contact night-coverage [email or web address] Password [facility] 01-26-19, 3:58 PM

## 2019-02-08 NOTE — Progress Notes (Signed)
  Morphine given per [facility] : [facility] for tachypnea, and work of breathing. Patient resting comfortably

## 2019-02-08 DEATH — deceased
